# Patient Record
Sex: Male | Born: 1935 | Race: White | Hispanic: No | Marital: Married | Smoking: Never smoker
Health system: Southern US, Community
[De-identification: ages and names within clinical notes are randomized; demographics above are authoritative.]

## PROBLEM LIST (undated history)

## (undated) DIAGNOSIS — K6389 Other specified diseases of intestine: Secondary | ICD-10-CM

## (undated) DIAGNOSIS — I440 Atrioventricular block, first degree: Secondary | ICD-10-CM

## (undated) DIAGNOSIS — G5600 Carpal tunnel syndrome, unspecified upper limb: Secondary | ICD-10-CM

## (undated) DIAGNOSIS — G4733 Obstructive sleep apnea (adult) (pediatric): Secondary | ICD-10-CM

## (undated) DIAGNOSIS — Z87442 Personal history of urinary calculi: Secondary | ICD-10-CM

## (undated) DIAGNOSIS — I2510 Atherosclerotic heart disease of native coronary artery without angina pectoris: Secondary | ICD-10-CM

## (undated) DIAGNOSIS — I359 Nonrheumatic aortic valve disorder, unspecified: Secondary | ICD-10-CM

## (undated) DIAGNOSIS — N200 Calculus of kidney: Secondary | ICD-10-CM

## (undated) DIAGNOSIS — D126 Benign neoplasm of colon, unspecified: Secondary | ICD-10-CM

## (undated) DIAGNOSIS — I4510 Unspecified right bundle-branch block: Secondary | ICD-10-CM

## (undated) DIAGNOSIS — G4700 Insomnia, unspecified: Secondary | ICD-10-CM

## (undated) DIAGNOSIS — G629 Polyneuropathy, unspecified: Secondary | ICD-10-CM

## (undated) DIAGNOSIS — I10 Essential (primary) hypertension: Secondary | ICD-10-CM

## (undated) DIAGNOSIS — K219 Gastro-esophageal reflux disease without esophagitis: Secondary | ICD-10-CM

## (undated) DIAGNOSIS — E785 Hyperlipidemia, unspecified: Secondary | ICD-10-CM

## (undated) DIAGNOSIS — M1990 Unspecified osteoarthritis, unspecified site: Secondary | ICD-10-CM

## (undated) DIAGNOSIS — R42 Dizziness and giddiness: Secondary | ICD-10-CM

## (undated) DIAGNOSIS — J189 Pneumonia, unspecified organism: Secondary | ICD-10-CM

## (undated) DIAGNOSIS — K638219 Small intestinal bacterial overgrowth, unspecified: Secondary | ICD-10-CM

## (undated) DIAGNOSIS — I255 Ischemic cardiomyopathy: Secondary | ICD-10-CM

## (undated) HISTORY — DX: Dizziness and giddiness: R42

## (undated) HISTORY — DX: Nonrheumatic aortic valve disorder, unspecified: I35.9

## (undated) HISTORY — DX: Insomnia, unspecified: G47.00

## (undated) HISTORY — PX: OTHER SURGICAL HISTORY: SHX169

## (undated) HISTORY — DX: Hyperlipidemia, unspecified: E78.5

## (undated) HISTORY — DX: Small intestinal bacterial overgrowth, unspecified: K63.8219

## (undated) HISTORY — DX: Obstructive sleep apnea (adult) (pediatric): G47.33

## (undated) HISTORY — DX: Other specified diseases of intestine: K63.89

## (undated) HISTORY — PX: COLONOSCOPY W/ POLYPECTOMY: SHX1380

## (undated) HISTORY — DX: Essential (primary) hypertension: I10

## (undated) HISTORY — PX: KNEE SURGERY: SHX244

## (undated) HISTORY — DX: Polyneuropathy, unspecified: G62.9

## (undated) HISTORY — PX: ESOPHAGOGASTRODUODENOSCOPY: SHX1529

## (undated) HISTORY — DX: Benign neoplasm of colon, unspecified: D12.6

## (undated) HISTORY — DX: Carpal tunnel syndrome, unspecified upper limb: G56.00

---
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
## 1988-08-01 HISTORY — PX: SHOULDER OPEN ROTATOR CUFF REPAIR: SHX2407

## 1992-08-01 HISTORY — PX: HERNIA REPAIR: SHX51

## 2000-10-01 ENCOUNTER — Inpatient Hospital Stay (HOSPITAL_COMMUNITY): Admission: EM | Admit: 2000-10-01 | Discharge: 2000-10-03 | Payer: Self-pay

## 2001-08-01 HISTORY — PX: CORONARY ANGIOPLASTY: SHX604

## 2001-08-01 HISTORY — PX: CORONARY STENT PLACEMENT: SHX1402

## 2001-08-01 HISTORY — PX: CARDIAC CATHETERIZATION: SHX172

## 2002-01-04 ENCOUNTER — Encounter: Payer: Self-pay | Admitting: Emergency Medicine

## 2002-01-04 ENCOUNTER — Inpatient Hospital Stay (HOSPITAL_COMMUNITY): Admission: EM | Admit: 2002-01-04 | Discharge: 2002-01-07 | Payer: Self-pay | Admitting: Emergency Medicine

## 2002-01-28 ENCOUNTER — Ambulatory Visit (HOSPITAL_COMMUNITY): Admission: RE | Admit: 2002-01-28 | Discharge: 2002-01-29 | Payer: Self-pay | Admitting: Cardiovascular Disease

## 2002-08-01 HISTORY — PX: CATARACT EXTRACTION, BILATERAL: SHX1313

## 2005-10-30 HISTORY — PX: LITHOTRIPSY: SUR834

## 2005-10-30 HISTORY — PX: KIDNEY STONE SURGERY: SHX686

## 2005-11-18 ENCOUNTER — Emergency Department (HOSPITAL_COMMUNITY): Admission: EM | Admit: 2005-11-18 | Discharge: 2005-11-18 | Payer: Self-pay | Admitting: Emergency Medicine

## 2005-11-28 ENCOUNTER — Ambulatory Visit (HOSPITAL_COMMUNITY): Admission: RE | Admit: 2005-11-28 | Discharge: 2005-11-28 | Payer: Self-pay | Admitting: Urology

## 2007-08-02 HISTORY — PX: CHOLECYSTECTOMY: SHX55

## 2007-10-28 ENCOUNTER — Inpatient Hospital Stay (HOSPITAL_COMMUNITY): Admission: EM | Admit: 2007-10-28 | Discharge: 2007-11-02 | Payer: Self-pay | Admitting: Emergency Medicine

## 2007-10-31 ENCOUNTER — Encounter (INDEPENDENT_AMBULATORY_CARE_PROVIDER_SITE_OTHER): Payer: Self-pay | Admitting: *Deleted

## 2009-02-12 IMAGING — RF DG CHOLANGIOGRAM OPERATIVE
1 series · 17 of 24 positions shown · non-contrast
Comparison: None

CLINICAL DATA: Cholelithiasis and cholecystitis

INTRAOPERATIVE CHOLANGIOGRAM
TECHNIQUE: Multiple fluoroscopic spot radiographs were obtained
during intraoperative cholangiogram and are submitted for
interpretation post-operatively.

[Series 0: run · 17 of 56 slices shown]
[im 1/56]
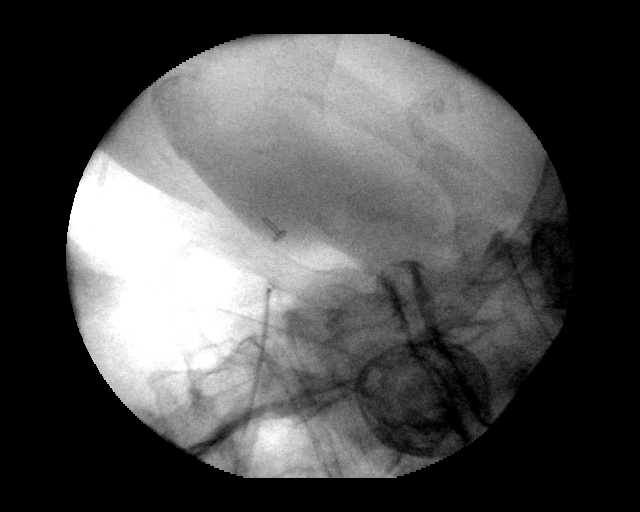
[im 5/56]
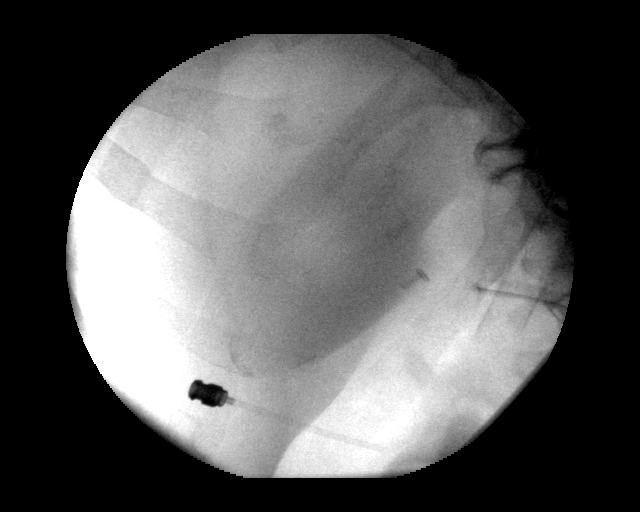
[im 8/56]
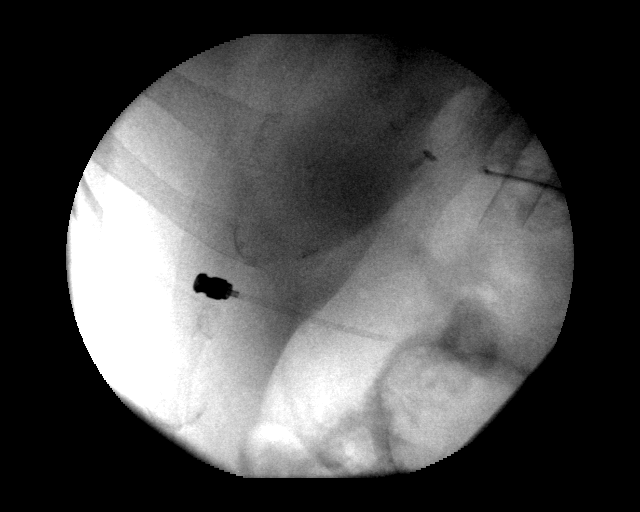
[im 10/56]
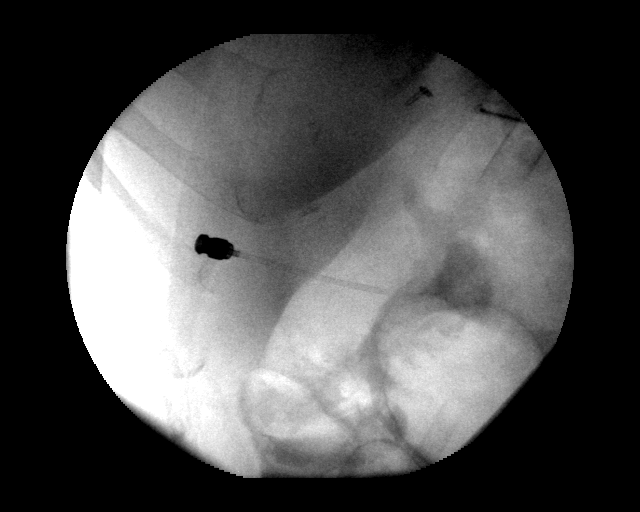
[im 15/56]
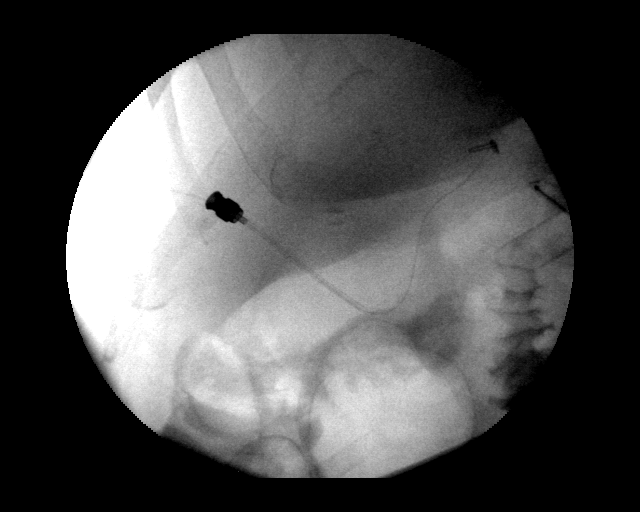
[im 17/56]
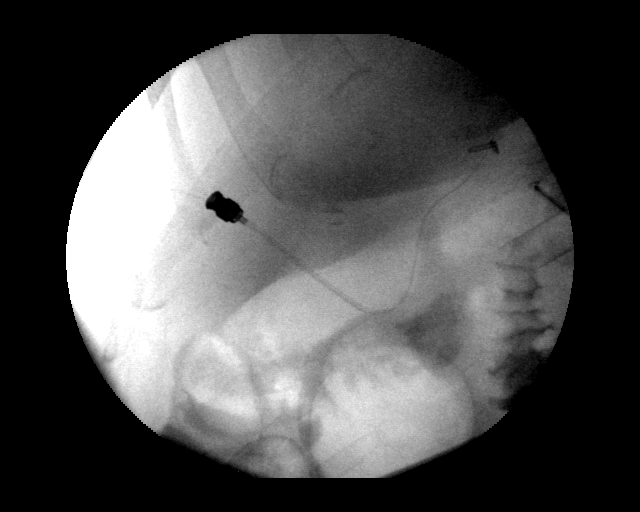
[im 22/56]
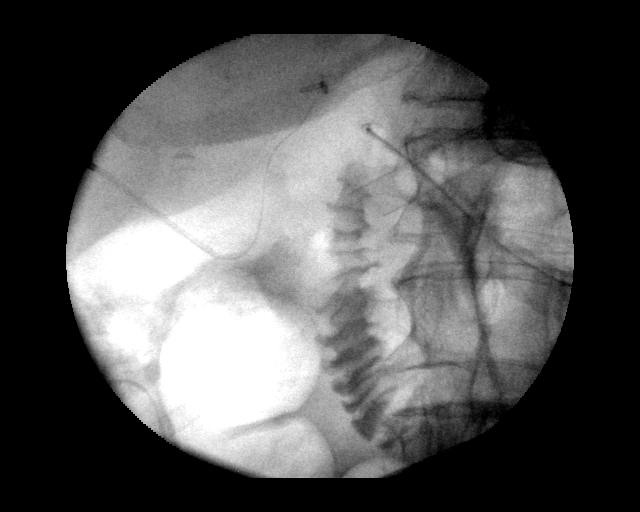
[im 24/56]
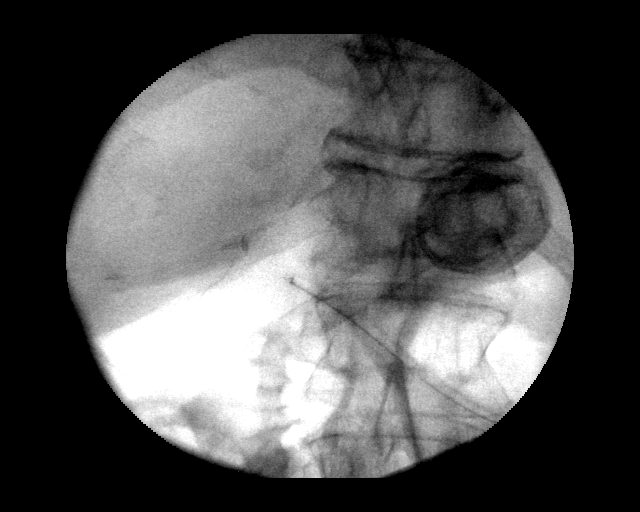
[im 29/56]
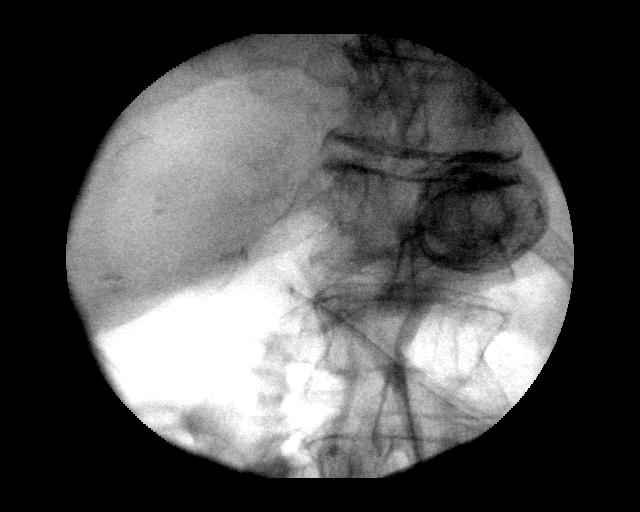
[im 32/56]
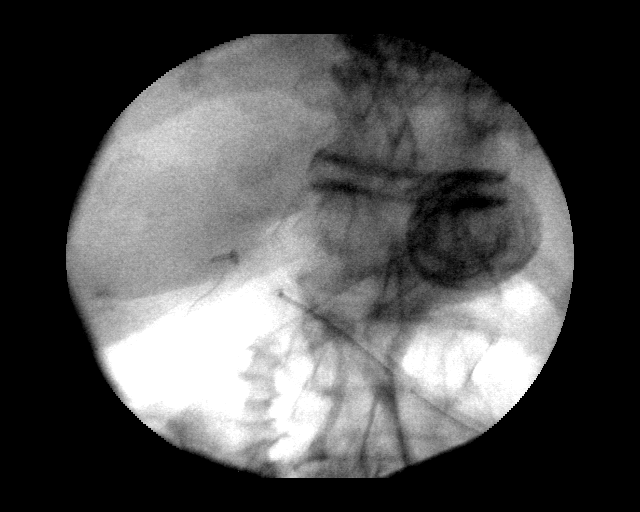
[im 34/56]
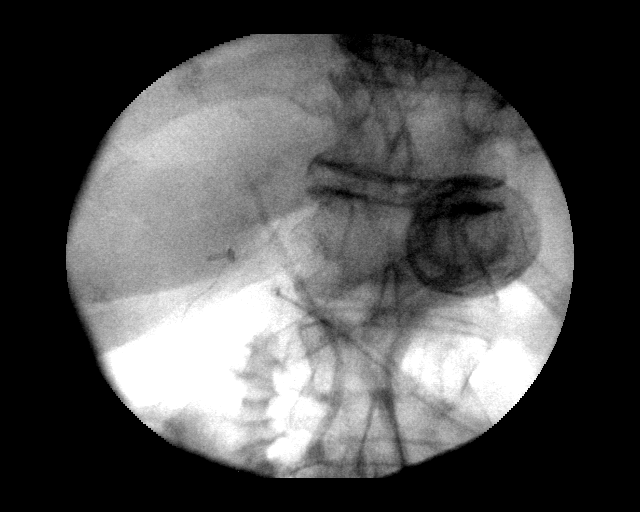
[im 39/56]
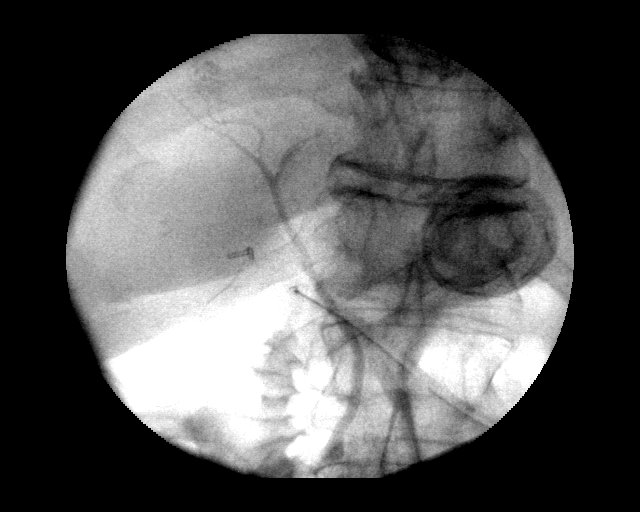
[im 41/56]
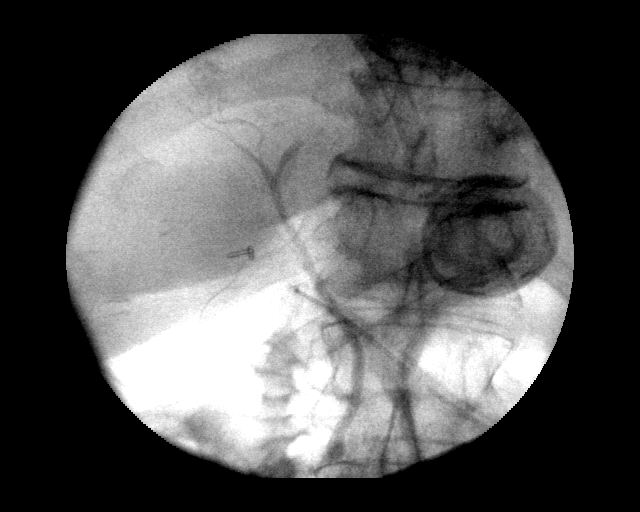
[im 46/56]
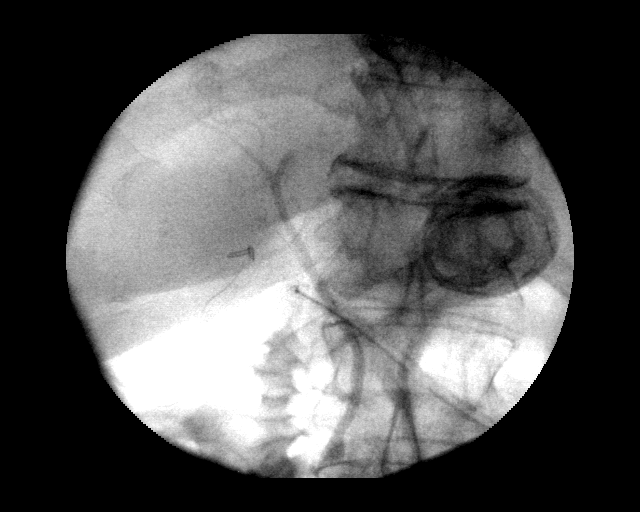
[im 48/56]
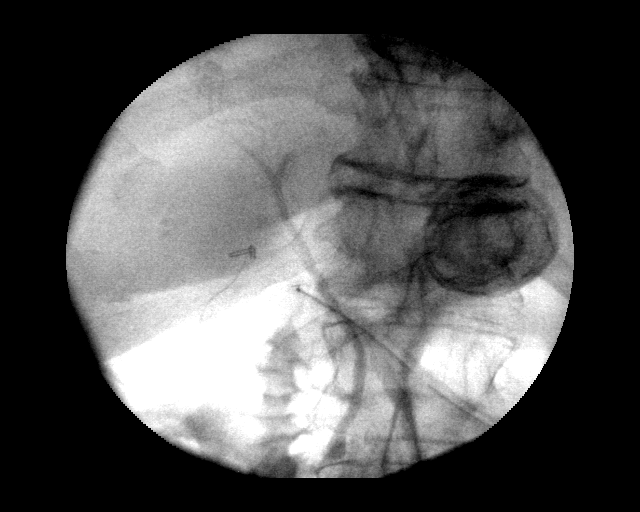
[im 51/56]
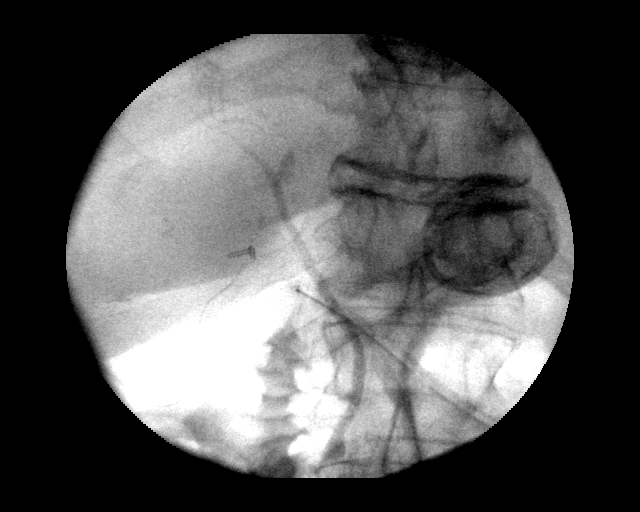
[im 56/56]
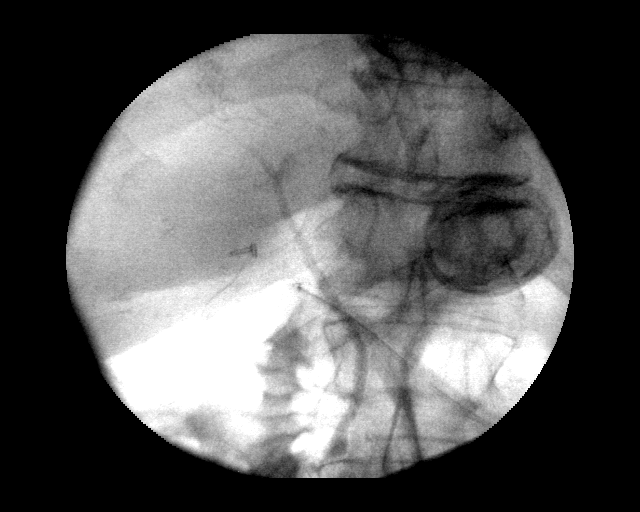

[17 of 24 positions shown; findings below may reference images not displayed]

FINDINGS: C-arm images demonstrate that the common hepatic and
common bile duct are widely patent.  There is free flow of contrast
into the duodenum with no visible filling defects.
IMPRESSION: Normal intraoperative cholangiogram.

## 2010-06-17 ENCOUNTER — Ambulatory Visit: Payer: Self-pay | Admitting: Cardiology

## 2010-06-17 LAB — LIPID PANEL
LDL Cholesterol: 80 mg/dL
Triglycerides: 195 mg/dL — AB (ref 40–160)

## 2010-08-30 ENCOUNTER — Encounter: Payer: Self-pay | Admitting: Cardiology

## 2010-08-30 DIAGNOSIS — I259 Chronic ischemic heart disease, unspecified: Secondary | ICD-10-CM | POA: Insufficient documentation

## 2010-08-30 DIAGNOSIS — I241 Dressler's syndrome: Secondary | ICD-10-CM | POA: Insufficient documentation

## 2010-08-30 DIAGNOSIS — I119 Hypertensive heart disease without heart failure: Secondary | ICD-10-CM | POA: Insufficient documentation

## 2010-10-19 ENCOUNTER — Ambulatory Visit: Payer: Self-pay | Admitting: Cardiology

## 2010-10-21 ENCOUNTER — Ambulatory Visit (INDEPENDENT_AMBULATORY_CARE_PROVIDER_SITE_OTHER): Payer: Medicare Other | Admitting: *Deleted

## 2010-10-21 ENCOUNTER — Other Ambulatory Visit: Payer: Self-pay | Admitting: Cardiology

## 2010-10-21 DIAGNOSIS — Z79899 Other long term (current) drug therapy: Secondary | ICD-10-CM

## 2010-10-21 DIAGNOSIS — E7800 Pure hypercholesterolemia, unspecified: Secondary | ICD-10-CM

## 2010-10-22 ENCOUNTER — Encounter: Payer: Self-pay | Admitting: Cardiology

## 2010-10-22 ENCOUNTER — Ambulatory Visit (INDEPENDENT_AMBULATORY_CARE_PROVIDER_SITE_OTHER): Payer: Medicare Other | Admitting: Cardiology

## 2010-10-22 DIAGNOSIS — I10 Essential (primary) hypertension: Secondary | ICD-10-CM

## 2010-10-22 DIAGNOSIS — E7800 Pure hypercholesterolemia, unspecified: Secondary | ICD-10-CM

## 2010-10-22 DIAGNOSIS — I2510 Atherosclerotic heart disease of native coronary artery without angina pectoris: Secondary | ICD-10-CM

## 2010-10-22 LAB — LIPID PANEL
Cholesterol: 157 mg/dL (ref 0–200)
VLDL: 39 mg/dL (ref 0–40)

## 2010-10-22 LAB — COMPREHENSIVE METABOLIC PANEL
ALT: 13 U/L (ref 0–53)
CO2: 24 mEq/L (ref 19–32)
Chloride: 101 mEq/L (ref 96–112)
Potassium: 4.1 mEq/L (ref 3.5–5.3)
Sodium: 138 mEq/L (ref 135–145)
Total Bilirubin: 0.6 mg/dL (ref 0.3–1.2)
Total Protein: 7 g/dL (ref 6.0–8.3)

## 2010-10-22 NOTE — Progress Notes (Signed)
History of Present Illness: This pleasant 76 year old gentleman is seen for a four-month followup office visit.  He has a history of ischemic heart disease.  He had a heart attack with cardiac catheterization and atherectomy and stent to his LAD in 2003.  This was a staged procedure after he had had successful percutaneous transluminal coronary angioplasty and stenting of his left circumflex at the time of his acute inferolateral myocardial infarction.  Since then he has done well.  His last nuclear stress test on 11/03/09 showed an ejection fraction of 53% and no reversible ischemia.  The patient has felt well.  He has not had any recurrent angina.  He stays physically active doing yard work.  He has not been using his treadmill much.  His weight is unchanged.  We had [facility] into [facility] for the patient last visit but he no longer qualifies because he's not had any acute events or procedures.  Current Outpatient Prescriptions  Medication Sig Dispense Refill  . amLODipine (NORVASC) 5 MG tablet Take 5 mg by mouth daily.        Marland Kitchen aspirin 325 MG EC tablet Take 325 mg by mouth daily.        . clopidogrel (PLAVIX) 75 MG tablet Take 75 mg by mouth daily.        Marland Kitchen gabapentin (NEURONTIN) 800 MG tablet Take 1,200 mg by mouth daily.       Marland Kitchen lisinopril (PRINIVIL,ZESTRIL) 20 MG tablet Take 20 mg by mouth daily.        . metoprolol (TOPROL-XL) 25 MG 24 hr tablet Take 25 mg by mouth daily.        . Multiple Vitamin (MULTIVITAMIN) tablet Take 1 tablet by mouth daily.        . niacin-simvastatin (SIMCOR) 1000-20 MG 24 hr tablet Take 1 tablet by mouth at bedtime.        . nitroGLYCERIN (NITROSTAT) 0.4 MG SL tablet Place 0.4 mg under the tongue every 5 (five) minutes as needed.        . sildenafil (VIAGRA) 100 MG tablet Take 100 mg by mouth as needed.        . zolpidem (AMBIEN) 10 MG tablet Take 10 mg by mouth at bedtime as needed.          Allergies  Allergen Reactions  . Lipitor  (Atorvastatin Calcium)   . Mevacor (Lovastatin)     Patient Active Problem List  Diagnoses  . Ischemic heart disease  . Post myocardial infarction syndrome  . Coronary artery disease  . Aortic valve disease  . Aortic valve sclerosis  . Aortic insufficiency  . Hypertension    History  Smoking status  . Former Smoker  Smokeless tobacco  . Not on file    History  Alcohol Use: Not on file    No family history on file.  Review of Systems: Constitutional: no fever chills diaphoresis or fatigue or change in weight.  Head and neck: no hearing loss, no epistaxis, no photophobia or visual disturbance. Respiratory: No cough, shortness of breath or wheezing. Cardiovascular: No chest pain peripheral edema, palpitations. Gastrointestinal: No abdominal distention, no abdominal pain, no change in bowel habits hematochezia or melena. Genitourinary: No dysuria, no frequency, no urgency, no nocturia. Musculoskeletal:No arthralgias, no back pain, no gait disturbance or myalgias. Neurological: No dizziness, no headaches, no numbness, no seizures, no syncope, no weakness, no tremors. Hematologic: No lymphadenopathy, no easy bruising. Psychiatric: No confusion, no hallucinations, no sleep disturbance.  Physical Exam: His weight is 203.  Vital signs as recorded.  The general appearance is that of a well-developed well-nourished gentleman in no distress.Pupils equal and reactive.   Extraocular Movements are full.  There is no scleral icterus.  The mouth and pharynx are normal.  The neck is supple.  The carotids reveal no bruits.  The jugular venous pressure is normal.  The thyroid is not enlarged.  There is no lymphadenopathy.The chest is clear to percussion and auscultation. There are no rales or rhonchi. Expansion of the chest is symmetrical.The precordium is quiet.  The first heart sound is normal.  The second heart sound is physiologically split.  There is no gallop rub or click.There is a  grade 2/6 systolic ejection murmur at the aortic area.  There is no abnormal lift or heave.The abdomen is soft and nontender. Bowel sounds are normal. The liver and spleen are not enlarged. There Are no abdominal masses. There are no bruits.The pedal pulses are good.  There is no phlebitis or edema.  There is no cyanosis or clubbing.Strength is normal and symmetrical in all extremities.  There is no lateralizing weakness.  There are no sensory deficits.   Assessment / Plan:

## 2010-10-22 NOTE — Assessment & Plan Note (Signed)
Blood pressure remains satisfactory on present medications.

## 2010-10-22 NOTE — Assessment & Plan Note (Signed)
The patient will continue his same medication but work harder on weight loss and limitation of carbohydrates.

## 2010-10-22 NOTE — Assessment & Plan Note (Signed)
No recurrent angina pectoris.  He will be due for another stress test later this year.

## 2010-11-15 ENCOUNTER — Other Ambulatory Visit: Payer: Self-pay | Admitting: Cardiology

## 2010-11-15 DIAGNOSIS — I10 Essential (primary) hypertension: Secondary | ICD-10-CM

## 2010-11-15 NOTE — Telephone Encounter (Signed)
Refill request

## 2010-12-02 ENCOUNTER — Other Ambulatory Visit: Payer: Self-pay | Admitting: Dermatology

## 2010-12-05 ENCOUNTER — Other Ambulatory Visit: Payer: Self-pay | Admitting: Cardiology

## 2010-12-05 DIAGNOSIS — I10 Essential (primary) hypertension: Secondary | ICD-10-CM

## 2010-12-14 NOTE — Discharge Summary (Signed)
NAME:  Gabriel [name], Gabriel [name] NO.:  [id number]   MEDICAL RECORD NO.:  [id number]          PATIENT TYPE:  INP   LOCATION:  5503                         FACILITY:  [facility]   PHYSICIAN:  Alfonse Ras, MD   DATE OF BIRTH:  June 08, 1935   DATE OF ADMISSION:  10/27/2007  DATE OF DISCHARGE:  11/02/2007                               DISCHARGE SUMMARY   OPERATING SURGEON:  Alfonse Ras, MD.   CONSULTANT:  Cassell Clement, M.D. with cardiology.   REASON FOR ADMISSION:  This is a 76 year old white male who presented to  the emergency department with right upper quadrant tenderness.  There is  no H and P in the chart, and therefore I do not know any of the details  of the patient's presenting symptoms except for right upper quadrant  tenderness.  On presentation to the emergency department, the patient's  white blood cell count was found to be 17,400 with 85% neutrophils.  His  total bilirubin was found to be 1.4 with no other abnormalities in his  LFTs.  A CT scan at this time was done of the abdomen and pelvis, which  showed a distended gallbladder with questionable gallbladder wall  thickening and a followup ultrasound was recommended.  At this time, an  ultrasound of his abdomen was done, which showed cholelithiasis with  gallbladder wall thickening and sonographic Murphy sign compatible with  acute cholecystitis.  At this time, the patient was admitted and was  kept n.p.o. except for ice chips and medications.  He was started on IV  Unasyn.  He also had his Plavix as well as aspirin held for potential  surgical intervention in the future.   ADMITTING DIAGNOSIS:  Acute cholecystitis.   HOSPITAL COURSE:  On hospital day 1, the patient was admitted and labs  will be checked, which showed a decrease in his white blood cell count  fell to 13,400.  Otherwise, his total bilirubin had decreased back down  to 1.1 and the rest of his LFTs at that time were normal.  At this  time,  the patient was continued on hold for his aspirin and Plavix, and Dr.  Patty Sermons was consulted to clear the patient for central surgical  intervention.  Dr. Patty Sermons saw the patient on hospital day 2, which  was on October 29, 2007.  At this time, he recommended getting a baseline  EKG as well as a chest x-ray.  His findings on EKG were essentially  normal with sinus rhythm with a first-degree AV block.  His chest x-ray  was also essentially normal except for showing low lung volumes with  bibasilar right greater than left atelectasis.  At this time, Cardiology  felt that the patient was cleared for surgery and allowed [place] to be okay  to proceed.  On exam on hospital day 2, the patient was still having  persistent right upper quadrant pain, however, the patient stated that  he was beginning to get hungry and therefore he was started on a clear  liquid diet.  At this time, plans of proceeding with a laparoscopic  cholecystectomy on either October 30, 2007, or October 31, 2007.  By October 30, 2007, the patient was still complaining of abdominal pain, however,  this was well controlled with pain medicines.  At this point, he was  tolerating clears with minimal nausea.  At this point, we are planning  on proceeding with a laparoscopic cholecystectomy on October 31, 2007.  This allowed [place] to give the patient an extra day off of his aspirin and  Plavix to help prevent intraoperative bleeding.  On October 31, 2007, the  patient was taken to the operating room where a laparoscopic  cholecystectomy was performed with intraoperative cholangiogram which  was normal.  In the surgery, a gangrenous gallbladder was found and was  removed with no complications.  On postoperative day 1, the patient had  a mild temperature of 100, but stated that his pain was somewhat better  and fairly well controlled on his Vicodin.  Of note, because of some  drainage during surgery a JP drain was placed.  On postoperative day  1,  this drain had some serous drainage.  Otherwise, the rest of his  incisions had just minimal drainage noted.  At this time, based on the  patient having a drain as well as still some discomfort, he was kept an  extra day.  On postoperative day 2, the patient was now afebrile and was  feeling very well.  He had been tolerating a regular low-fat diet and  ambulating in the halls.  At this time, he states that he is ready to go  home.  On physical exam, the patient's abdomen is soft and tender with  active bowel sounds.  His JP drain shows minimal serosanguineous output  and the drain at this time was removed with no complications.  After the  drain was removed a pressure gauze dressing was placed over the drain  site.  At this time, the patient was felt stable to be discharged home.   DISCHARGE DIAGNOSES:  1. Acute gangrenous cholecystitis.  2. Status post laparoscopic cholecystectomy with intraoperative      cholangiogram.   DISCHARGE MEDICATIONS:  The patient is told to continue all of his home  medications except for his Plavix and aspirin.  He is told that he may  begin these on Saturday.  However, he was told that when at home if he  began to have some drainage from his incision that was bloody, he may  want to hold his Plavix and aspirin to either Sunday or Monday.  Otherwise, the patient was given a prescription for Vicodin 5/325 1-2  tablets every 4 hours as needed for pain.   DISCHARGE INSTRUCTIONS:  The patient was informed that he may return to  work early next week as he felt able.  The patient does a desk job and  therefore this does not require any lifting or heavy activity.  He is  told that he has no dietary restrictions, that he may increase his  activity slowly, walk up steps.  He is also informed that he may shower,  however, he is not safe for the next 2 weeks.  He is also informed not  to lift anything for 2 weeks greater than 15 pounds or to drive while   taking his narcotic pain medication.  He is also informed to pat his  Steri-Strips dry when he gets out of the shower and to not rub them.  His wife is also informed that she may change  the gauze dressing over  his drain site as needed due to potential saturations.  He is also  informed that he may call our office if he begins to get a fever greater  than 101.5, new or severe abdominal pain or redness or pus-like drainage  from his incisions.  He is to, at this time, follow up with Dr. Colin Benton in  the office in 2 weeks for followup.      Letha Cape, PA      Alfonse Ras, MD  Electronically Signed    KEO/MEDQ  D:  11/02/2007  T:  11/03/2007  Job:  [id number]   cc:   Cassell Clement, M.D.

## 2010-12-14 NOTE — Op Note (Signed)
NAME:  TION, TSE NO.:  [id number]   MEDICAL RECORD NO.:  [id number]          PATIENT TYPE:  INP   LOCATION:  5503                         FACILITY:  [facility]   PHYSICIAN:  Alfonse Ras, MD   DATE OF BIRTH:  [birth year]   DATE OF PROCEDURE:  10/31/2007  DATE OF DISCHARGE:                               OPERATIVE REPORT   PREOPERATIVE DIAGNOSIS:  Acute cholecystitis.   POSTOPERATIVE DIAGNOSES:  Acute cholecystitis, acute gangrenous  cholecystitis.   PROCEDURE:  Laparoscopic cholecystectomy with intraoperative  cholangiogram.   FINDINGS:  Normal cholangiogram and gangrenous gallbladder.   SURGEON:  Alfonse Ras, MD   ASSISTANT:  Leonie Man, M.D.   ANESTHESIA:  General endotracheal tube.   DESCRIPTION OF PROCEDURE:  The patient was taken to the operating room,  placed in supine position.  After adequate general anesthesia was  induced using endotracheal tube, the abdomen was prepped and draped in  normal sterile fashion.  Using a transverse infraumbilical incision, I  dissected down to the fascia.  Fascia was opened vertically.  A 0 Vicryl  pursestring suture was placed around the fascial defect and Hasson  trocar was placed in the abdomen.  Pneumoperitoneum was obtained.  Under  direct vision, an 11-mm port was placed in subxiphoid region, two 5-mm  ports were placed in the right abdomen.  The gallbladder was identified  and a number of very dense omental adhesions were broken up with a  phlegmon around the gallbladder.  It was aspirated with __________  aspirator.  There were areas of patchy necrosis throughout the  gallbladder.  It was grasped and retracted cephalad.  Again, a tedious  blunt dissection was undertaken at the neck of the gallbladder until the  cystic duct was identified.  It appeared quite short and was dissected  free.  It was clipped proximally on the gallbladder and small ductotomy  was made.  Cholangiocatheter was advanced  down through the Angiocath and  the cholangiogram was performed.  This showed free flow into the  duodenum, no filling defects, and normal caliber ducts.  Cholangiocatheter was removed.  Cystic duct was triply clipped and  ligated and divided.  The cystic artery was dissected, triply clipped,  and divided in the similar fashion.  The gallbladder was then taken off  the gallbladder bed using Bovie electrocautery and removed through the  umbilical port.  It was placed in EndoCatch bag prior to removal.  The  right upper quadrant was copiously irrigated.  Surgicel packing was  placed in the gallbladder fossa.  Drain was placed through the trocar in  the right midabdomen and placed in the gallbladder fossa.  Pneumoperitoneum was released.  Trocars  were removed.  Fascial defect was closed with the 0 Vicryl pursestring  suture.  Skin incisions were closed with subcuticular 4-0 Monocryl and  drain was sutured in place.  Sterile dressings were applied.  The  patient tolerated the procedure well and went to PACU in good condition.      Alfonse Ras, MD  Electronically Signed     KRE/MEDQ  D:  10/31/2007  T:  11/01/2007  Job:  [id number]

## 2010-12-17 NOTE — Cardiovascular Report (Signed)
[place]. [facility]  Patient:    Gabriel [name], Gabriel [name] Visit Number: [id number] MRN: [id number]          Service Type: MED Location: 2000 2041 [id number] Attending Physician:  Koren Bound Dictated by:   Armanda Magic, M.D. Proc. Date: 01/04/02 Admit Date:  01/04/2002   CC:         Thomas A. Patty Sermons, M.D.   Cardiac Catheterization  DATE OF BIRTH:  1934/10/03  PRIMARY CARDIOLOGIST:  Maisie Fus A. Patty Sermons, M.D.  PROCEDURE:  Left heart catheterization, coronary angiography, left ventriculography.  OPERATOR:  Armanda Magic, M.D.  INDICATIONS:  Acute inferior posterior MI.  COMPLICATIONS:  None.  IV ACCESS:  Via right femoral artery, #6 French sheath.  HISTORY:  This is a 76 year old white male with a history of PTCA of the right coronary artery and PTCA/stenting of the right coronary artery, most recently a year ago.  He now presents with an acute inferior posterior MI.  DESCRIPTION OF PROCEDURE:  The patient was brought to the cardiac catheterization laboratory in the fasting nonsedated state.  Informed consent was obtained.  The patient was connected to continuous heart rate and pulse oximetry monitoring and intermittent blood pressure monitoring.  The right groin was prepped and draped in a sterile fashion.  Xylocaine, 1%, was used for local anesthesia.  Using the modified Seldinger technique, a #6 French sheath was placed in the right femoral artery.  Under fluoroscopic guidance, a #6 [place] JR4 catheter was placed in the right coronary artery.  Multiple cine films were taken in a 30-degree RAO, 40-degree LAO view.  This catheter was then exchanged out over a guidewire for a #6 [place] JL4 catheter which was placed under fluoroscopic guidance in the left coronary artery.  Multiple cine films were taken in a 30-degree RAO, 40-degree LAO view.  This catheter was then exchanged out over a guidewire for a #6 French angled pigtail catheter was  placed in the left ventricular cavity.  Left ventriculography was performed in a 30-degree RAO view using a total of 30 cc of contrast at 13 cc per second and in the 40-degree cranial LAO view using a total of 20 cc at 12 cc per second.  The catheter was then pulled back across the aortic valve with no significant gradient noted.  At the end of the procedure, the patient went on to percutaneous transluminal intervention of the left circumflex artery by Dr. Delane Ginger.  RESULTS: 1. The right coronary artery is patent with luminal irregularities up to 20%.    It distally bifurcates into a posterior descending artery and    posterolateral artery, both of which are widely patent.  2. The left coronary artery bifurcates after a short left main into the left    circumflex and left anterior descending artery.  3. The left circumflex is proximally occluded.  4. The left anterior descending artery gives rise to a first diagonal which is    patent and then there is a 60-70% narrowing in the proximal and mid portion    of the LAD at the takeoff of the second diagonal branch.  The rest of the    LAD is patent to the apex.  LEFT VENTRICULOGRAPHY:  Left ventriculography performed in the 30-degree RAO view and 40-degree LAO view using a total of 30 cc of contrast at 13 cc per second and 20 cc of contrast at 12 cc per second showed moderate LV dysfunction, EF 35%, with severe  posterior inferior hypokinesis.  HEMODYNAMIC DATA: 1. Aortic pressure 130/84 mmHg. 2. Left ventricular pressure 132/31 mmHg.  ASSESSMENT: 1. Acute inferior posterior myocardial infarction. 2. Occluded left circumflex.  Patent right coronary artery stent and 60-70%    left anterior descending artery at the takeoff of the second diagonal. 3. Moderate left ventricular dysfunction with inferior posterior hypokinesis.  PLAN:  Admit to the CCU and percutaneous transluminal intervention of the left circumflex by Dr.  Elease Hashimoto. Dictated by:   Armanda Magic, M.D. Attending Physician:  Koren Bound DD:  01/04/02 TD:  01/07/02 Job: [id number]

## 2010-12-17 NOTE — Discharge Summary (Signed)
[place]. [facility]  Patient:    Gabriel [name], Gabriel [name]                      MRN: [id number] Adm. Date:  [id number] Disc. Date: [id number] Attending:  Rudean Hitt                           Discharge Summary  FINAL DIAGNOSES: 1. Acute myocardial infarction. 2. Coronary atherosclerosis. 3. Hyperlipidemia.  OPERATIONS:  Cardiac catheterization by Dr. Meade Maw, with angioplasty and stent by Dr. Delane Ginger on October 01, 2000.  HISTORY OF PRESENT ILLNESS:  This 76 year old gentleman was admitted as an emergency on October 01, 2000, by Dr. Meade Maw because of chest discomfort. He began experiencing chest pain radiating to the left arm approximately 12 hours prior to presentation.  He had had a complete physical in our office two days earlier and was scheduled for a stress Cardiolite the next week because of a recent history of recurrent chest pain suggestive of recurrent angina.  He had had a previous angioplasty in 1996, by Dr. Elease Hashimoto.  The patient had been feeling well until several weeks prior to admission when he began to experience some exertional chest discomfort of a mild nature, resolving with rest.  ADMISSION PHYSICAL EXAMINATION:  Blood pressure 110/56, heart rate 71, temperature 98.7, oxygen saturation 96% on room air.  He had some mild left shoulder pain which was resolving with IV nitroglycerin.  There was no neck vein distention.  The chest was clear.  The heart revealed no gallop.  The abdomen was soft and nontender.  The extremities showed no peripheral edema, and pedal pulses were good.  HOSPITAL COURSE:  The electrocardiographic analysis initially showed normal EKG, with normal R-wave progression, and no definite ischemic changes. However, his CK returned elevated at 505, with an MB of 60, and his troponin I returned elevated at 1.51.  The initial plan was to treat him with IV nitroglycerin and aspirin, Integrilin, and Lovenox,  and allow Dr. Elease Hashimoto to perform cardiac catheterization the following day.  However, the patient continued to have chest discomfort and, therefore, Dr. Fraser Din took him to the laboratory semi-acutely, where cardiac catheterization was performed, and then Dr. Elease Hashimoto was asked to proceed with the angioplasty.  He was found to have an occluded mid distal right coronary artery.  He was found to have good antegrade flow but with obvious dissection of the disease right coronary artery.  He was continued on Integrilin for 18 hours postangioplasty and stent.  By October 02, 2000, he was able to ambulate later in the day, and he was begun on Plavix.  By the following morning, October 03, 2000, he was stable enough to be discharged home improved.  CK-MBs were 60, then 84, then 72.  EKG remained stable.  DISCHARGE MEDICATIONS: 1. Lipitor 10 mg 1 daily. 2. Lotrel 5/10 1 daily. 3. Ecotrin 81 mg daily. 4. Plavix 75 mg daily for one month. 5. Nitrostat 1/150 sublingually p.r.n. for chest pain.  DISCHARGE INSTRUCTIONS:  The patient is to walk at home.  He is to rest at home this week and not return to work.  FOLLOW-UP:  He will be seen in our office in one week for follow-up office visit and EKG.  DIET:  He will remain on a low cholesterol diet. DD:  10/14/00 TD:  10/15/00 Job: [id number]

## 2010-12-17 NOTE — Cardiovascular Report (Signed)
[place]. [facility]  Patient:    Gabriel [name], Gabriel [name]                      MRN: [id number] Proc. Date: 10/01/00 Adm. Date:  [id number] Attending:  Rudean Hitt CC:         Thomas A. Patty Sermons, M.D.   Cardiac Catheterization  PROCEDURES PERFORMED:  Percutaneous transluminal coronary angioplasty.  INDICATIONS:  Mr. Durenda Age is a 76 year old male with a history of coronary artery disease.  He started having episodes of chest pain and chest burning this morning.  He had some relief with Mylanta and nitroglycerin but the pain persisted.  He presented to the emergency room and was brought to the catheterization lab because of this persistent pain.  Please see the dictated cardiac catheterization note by Dr. Meade Maw.  PERCUTANEOUS TRANSLUMINAL CORONARY ANGIOPLASTY PROCEDURE:  The patient was found to have an occluded mid/distal right coronary artery, was also found to have some moderate disease involving the mid LAD.  It was elected to proceed with PTCA and intervention of the right coronary artery.  The patient had been given heparin in the emergency room.  His ACT was 216.  An additional 1500 units of heparin was given.  He was continued on the Integrilin drip.  The right coronary artery was engaged using a Judkins right 4 7 [place] guide.  The right coronary artery was easily wired using a Trooper angioplasty wire.  A 2.5 x 15 mm Quantum Monorail balloon was passed down to the distal stenosis. It was inflated up to 6 atmospheres for 44 seconds.  This resulted in some improvement of that lumen but some distal embolization of the clot.  The balloon was then passed down to the distal most segment of the occlusion and was inflated on three separate occasions up to 4 atmospheres for 30 seconds each.  This resulted in clearing of the thrombus burden with good flow. Followup angiography at this time revealed a plaque disruption which was probably the cause  of his acute myocardial infarction.  There was TIMI grade flow.  The length of the dissection and associated thrombus appear to be approximately 15 to 17 mm long.  A 2.5 x 25 mm Bx Velocity Hepacoat stent was positioned to cover the entire segment with a few millimeters of overlap on each side.  It was deployed at 12 atmospheres for 46 seconds.  Followup angiography revealed a very nicely opposed stented segment.  The mid area of the stent appeared to be somewhat under deployed.  At this point, the 2.75 x 15 mm Quantum Monorail was placed back down into the stent.  It was inflated up to 18 atmospheres for a total of 44 seconds.  The ending size was 2.9 mm.  Followup angiography revealed a very nice lumen with no obstruction to flow.  There was TIMI grade 3 flow at the end of the case.  There was some pseudostenosis created with wire placement.  This pseudostenosis resolved completely after the wire was removed.  COMPLICATIONS:  None.  CONCLUSIONS: 1. Successful percutaneous transluminal coronary angioplasty and stenting of    the mid/distal right coronary artery. 2. Well preserved left ventricular systolic function. 3. Moderate disease involving the left anterior descending.  We will consider    any further interventions after he heals up from this myocardial    infarction. DD:  10/01/00 TD:  10/02/00 Job: [id number]

## 2010-12-17 NOTE — Discharge Summary (Signed)
. [facility]  Patient:    Gabriel [name], Gabriel [name] Visit Number: [id number] MRN: [id number]          Service Type: CAT Location: 6500 6523 01 Attending Physician:  Koren Bound Dictated by:   Alvia Grove., M.D. Admit Date:  01/28/2002 Discharge Date: 01/29/2002   CC:         Thomas A. Patty Sermons, M.D.   Discharge Summary  DISCHARGE DIAGNOSES: 1. Coronary artery disease, status post rotational atherectomy and stenting of his left anterior descending coronary artery. 2. Hypertension.  DISCHARGE MEDICATIONS: 1. Enteric coated aspirin 325 mg q.d. 2. Plavix 75 mg q.d. 3. Norvasc 5 mg q.d. 4. Toprol XL 50 mg p.o. q.d. 5. Nitroglycerin p.r.n. 6. Altace 10 mg p.o. q.d.  FOLLOWUP:  The patient is to see Dr. Patty Sermons in several weeks.  DISCHARGE INSTRUCTIONS:  He is to eat a low fat, low salt diet. He is to call Dr. Elease Hashimoto for any additional problems.  HISTORY OF PRESENT ILLNESS:  The patient is a 76 year old gentleman with a history of coronary artery disease. He is admitted for PTCA of his left anterior descending coronary artery. Please see the dictated history and physical for further details.  HOSPITAL COURSE BY PROBLEM:  #1 - CORONARY ARTERY DISEASE:  The patient was admitted for scheduled rotational atherectomy and stenting of his LAD. He underwent successful rotational atherectomy using a 2.15 mm bur. We then placed a 3.0 x 16 mm express 2 stent inflated up to 16 atmospheres. He had a nice result. There was TIMI grade 3 flow. The patient did quite well following the procedure and was discharged in satisfactory condition. His left circumflex stent that we placed several weeks ago is widely patent. He will be followed up by Dr. Patty Sermons. Dictated by:   Alvia Grove., M.D. Attending Physician:  Koren Bound DD:  01/29/02 TD:  01/30/02 Job: [id number]

## 2010-12-17 NOTE — Discharge Summary (Signed)
[place]. [facility]  Patient:    Gabriel [name], Gabriel [name] Visit Number: [id number] MRN: [id number]          Service Type: CAT Location: 6500 6523 01 Attending Physician:  Koren Bound Admit Date:  01/28/2002 Discharge Date: 01/29/2002   CC:         Thomas A. Patty Sermons, M.D.   Discharge Summary  ADMISSION DIAGNOSES: Acute myocardial infarction.  DISCHARGE DIAGNOSES: 1. Acute myocardial infarction (inferior lateral wall). 2. History of coronary artery disease in the past with several PTCA and    stenting procedures in the past. 3. Hypertension. 4. Hyperlipidemia.  DISCHARGE MEDICATIONS: 1. Altace 10 mg daily. 2. Norvasc 5 mg daily. 3. Ecotrin 325 mg daily. 4. Plavix 75 mg daily. 5. Lopressor 25 mg p.o. b.i.d. 6. Lipitor 10 mg daily. 7. Nitroglycerin p.r.n.  DISPOSITION: The patient will return to see Dr. Patty Sermons in approximately one week.  HISTORY OF PRESENT ILLNESS: Please see dictated H&P for further details of history. He basically presented with an inferior lateral myocardial infarction. He was taken to the cath lab for emergent cath and possible angioplasty.  HOSPITAL COURSE: #1 - ACUTE LATERAL MYOCARDIAL INFARCTION: The patient was found to have an occluded circumflex artery. He underwent successful PTCA and stenting of the circumflex artery. He was also noted to have a LAD stenosis at that time. The patient improved steadily. It was decided to do the LAD procedure at a later date. He had no further episodes of chest pain and was up ambulating in the halls without problems.  #2 - HYPERTENSION: The patients medications were adjusted slightly. We increased his ace inhibitor slightly. He will be followed as an outpatient. The patient will be discharge on the above noted medications and disposition and we will schedule an elective intervention of his LAD lesion in the future. Attending Physician:  Koren Bound DD:   02/13/02 TD:  02/16/02 Job: [phone] [id number]

## 2010-12-17 NOTE — H&P (Signed)
[place]. [facility]  Patient:    Gabriel, [name]                      MRN: [id number] Adm. Date:  [id number] Attending:  Rudean Hitt CC:         Thomas A. Patty Sermons, M.D.   History and Physical  HISTORY:  Gabriel [name] is a 76 year old gentleman who presented to the emergency room with complaints of chest pain radiating to the left arm approximately 12 hours prior to his presentation.  The pain actually occurred the night before but resolved.  He took two sublingual nitroglycerin with relief but the pain returned at 11 a.m. and then persisted.  Associated symptoms included nausea and questionable shortness of breath.  The patient states he has had this chest pain for approximately 15 days but previously it had occurred with exertion but resolving with rest.  He had seen Dr. Patty Sermons on September 29, 2000 and a stress Cardiolite had been scheduled for further evaluation.  The patient reports he had a regular GXT performed in October 2000 which was reportedly normal.  The patient has known coronary artery disease.  An angioplasty was performed in June 1996 per Dr. Elease Hashimoto.  Currently there are no old charts available for review.  His angioplasty is reportedly on microfilm.  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  As noted above, he has had chest pain with exertion which would resolve.  REVIEW OF SYSTEMS:  He has sinus drainage which has been treated by Humibid and reflux symptoms for which he was started on Pepcid.  There has been no change in bowel or bladder habits.  No presyncope.  No syncope.  No palpitations.  No tachy arrhythmias.  PAST MEDICAL HISTORY:  Is as noted above.  Significant coronary artery disease.  Dyslipidemia.  CURRENT MEDICATIONS:  Pepcid, dose unknown.  Lipitor, dose is unknown. Aspirin and Humibid.  ALLERGIES:  He has no known drug  allergies.  PAST SURGICAL HISTORY:  Significant for rotator cuff repair, hernia repair and arthroscopic knee surgery.  SOCIAL HISTORY:  The patient is married.  A nonsmoker.  Social drinker only. No history of tobacco use.  No other drugs of abuse.  PHYSICAL EXAMINATION:  VITAL SIGNS:  Blood pressure 110/56, heart rate 71, temperature 98.7, saturation 96% on room air.  GENERAL:  The patient is a pleasant male who appears to be in no acute distress.  At the time of the exam he has a mild left shoulder pain which is resolving with IV nitroglycerin.  HEENT:  Unremarkable.  NECK:  No neck vein distention.  No carotid bruits.  Good carotid upstroke.  PULMONARY:  Reveals breath sounds which are equal and clear to auscultation. No use of accessory muscles.  CARDIOVASCULAR:  Regular rate and rhythm.  Normal S1.  Normal S2.  There is no rubs, murmurs, or gallops noted.  His PMI is within normal limits.  ABDOMEN:  Soft and nontender.  No bruits are noted.  Normal bowel sounds.  No unusual bruits or pulsations are noted.  EXTREMITIES:  Reveal no peripheral edema.  His distal pulses are equal and palpable.  SKIN: Warm and dry.  NEUROLOGIC:  Nonfocal.  Motor is 5/5 throughout.  LABORATORY DATA:  His ECG reveals a normal sinus rhythm with normal R wave progression.  Normal ECG.  Chest x-ray reveals poor inspiratory effort. Amylase  46, lipase 36, potassium 3.9, creatinine 1.1, glucose 160.  His CBC is still pending.  His CK is 505 with an MB of 60.  His troponin I is 1.51.  IMPRESSION: 1. NonQ wave myocardial infarction.  His left arm pain and chest pain is    resolving.  Will continue with IV nitroglycerin, aspirin, Integrilin and    Lovenox.  The patient will be scheduled for a left heart catheterization    per Dr. Elease Hashimoto unless he becomes more acute. 2. Dyslipidemia.  Resume home Lipitor dose when available. 3. Reflux. He will be continued on his Pepcid 20 mg p.o. q.d. DD:   10/01/00 TD:  10/02/00 Job: [id number]

## 2010-12-17 NOTE — Cardiovascular Report (Signed)
[place]. [facility]  Patient:    Gabriel [name], Gabriel [name] Visit Number: [id number] MRN: [id number]          Service Type: MED Location: 2000 2041 01 Attending Physician:  Koren Bound Dictated by:   Alvia Grove., M.D. Proc. Date: 01/05/02 Admit Date:  01/04/2002 Discharge Date: 01/07/2002   CC:         Thomas A. Patty Sermons, M.D.  Armanda Magic, M.D.   Cardiac Catheterization  INDICATIONS: The patient is a 76 year old gentleman with a history of coronary artery disease, status PTCA of his left anterior descending artery in 1996. He is status post PTCA and stenting of his right coronary artery. He presents tonight with episodes of chest pain and acute ST segment elevation in the inferior leads. He was brought to the catheterization lab and was catheterized by Dr. Armanda Magic. He was found to have a tight 70-80% proximal LAD stenosis. The left circumflex artery was flush occluded in the proximal segment. The right coronary artery had only minor luminal irregularities with a patent stent. I was called to help with PTCA and possible stenting of the left circumflex artery.  DESCRIPTION OF PROCEDURE: The patient was given 5000 units of heparin. This was followed by a double bolus Integrilin drip. The circumflex artery was wired using the trooper guide wire. A 3.0 x 15 mm Quantum Maverick was passed down across the stenosis. A total of six inflations were performed ranging from 4 atmospheres for 18 atmospheres. This resulted in antegrade flow down the circumflex artery with an obvious dissection, which had caused the original myocardial infarction. We were not able to pass an express stent or a Zeta stent. We then tried using a 2.0 x 6 mm Cutting Balloon to try to further dilate the stenosis. This was inflated up to 10 atmospheres for 40 seconds a piece on three occasions. The Zeta stent still would not cross the lesion. At this point, the guide  and wire were traded out. A Voda 3.5 guide was placed into the left main. A second trooper guide wire was positioned down the circumflex artery. This provided much better support. At this point, the Zeta stent passed easily into the stenosis. It was deployed at 10 atmospheres for 43 seconds. This resulted in remarkable improved left circumflex artery. There was some snowplowing of the plaque into the smaller first obtuse marginal artery. At this point, the wire was placed back into the first OM. The 2.0 x 6 mm Cutting Balloon was positioned in the proximal aspect of this vessel and two inflations were performed at 10 atmospheres. This was removed and the Quantum Maverick was positioned back across this OM lesion and was inflated up to 5 atmospheres for 45 seconds. This resulted in a very nice lumen. There was only minor luminal irregularities. The patient tolerated the procedure fairly well.  COMPLICATIONS: None.  CONCLUSIONS: 1. Acute inferolateral myocardial infarction caused by occlusion of the    circumflex artery. 2. Successful percutaneous transluminal coronary angioplasty and stenting    of the left circumflex artery with percutaneous transluminal coronary    angioplasty of the first obtuse marginal artery. The patient has a    relatively tight left anterior descending lesion that will have to be    addressed in the near future. Dictated by:   Alvia Grove., M.D. Attending Physician:  Koren Bound DD:  01/05/02 TD:  01/08/02 Job: [id number]

## 2010-12-17 NOTE — H&P (Signed)
[place]. [facility]  Patient:    Gabriel [name], Gabriel [name] Visit Number: [id number] MRN: [id number]          Service Type: MED Location: 2000 2041 01 Attending Physician:  Koren Bound Dictated by:   Alvia Grove., M.D. Admit Date:  01/04/2002 Discharge Date: 01/07/2002   CC:         Thomas A. Patty Sermons, M.D.   History and Physical  HISTORY OF PRESENT ILLNESS:  The patient is a middle-aged gentleman with a history of coronary artery disease, status post recent inferolateral wall myocardial infarction with subsequent PTCA and stenting of the left circumflex artery.  He was noted to have a tight LAD stenosis at that time.  He is brought back today for a precatheterization evaluation and to consider PTCA of the LAD.  Since he left the hospital several weeks ago he has continued to have some episodes of chest pain, especially with exertion.  He states that the chest pain is described as a tightness.  It typically goes away if he stops and rests.  The pain is not nearly as severe as his myocardial infarction pain.  He was somewhat slow to recover from his infarction because of the large distribution of the circumflex artery, but he says he has done quite well for the past several weeks.  CURRENT MEDICATIONS: 1. Lipitor 10 mg a day. 2. Ecotrin 325 mg a day. 3. Plavix 75 mg a day. 4. Altace 10 mg a day. 5. Norvasc 5 mg a day. 6. Toprol XL 25 mg a day.  ALLERGIES:  No known drug allergies.  PAST MEDICAL HISTORY: 1. Coronary artery disease. 2. Hypertension. 3. Dyslipidemia.  SOCIAL HISTORY:  The patient does not smoke.  Does not drink alcohol.  FAMILY HISTORY:  Unremarkable.  PHYSICAL EXAMINATION:  GENERAL:  Middle-aged gentleman.  In no acute distress.  Alert and oriented x3.  Mood and affect are normal.  VITAL SIGNS:  Weight 216.  Blood pressure 132/90, heart rate 80.  HEENT, NECK:  Reveals 2+ carotids.  He has no bruits.  There  is no JVD.  LUNGS:  Clear to auscultation.  HEART:  Regular rate.  S1, S2.  He has no murmurs, gallops, or rubs.  His PMI is nondisplaced.  ABDOMEN:  Good bowel sounds.  Nontender.  EXTREMITIES:  No clubbing, cyanosis, or edema.  His calf site is well healed.  IMPRESSION AND PLAN:  The patient presents with some episodes of chest pain. He has some known stenosis of his mid left anterior descending.  We will proceed with cardiac catheterization with probable rotational atherectomy, percutaneous transluminal coronary angioplasty and stenting of the left anterior descending.  We discussed the risks, benefits, and options.  He understands and agrees to proceed. Dictated by:   Alvia Grove., M.D. Attending Physician:  Koren Bound DD:  01/23/02 TD:  01/25/02 Job: [id number]

## 2010-12-17 NOTE — Cardiovascular Report (Signed)
[place]. [facility]  Patient:    OTNIEL, HOE Visit Number: [id number] MRN: [id number]          Service Type: CAT Location: 6500 6523 01 Attending Physician:  Koren Bound Dictated by:   Alvia Grove., M.D. Proc. Date: 01/28/02 Admit Date:  01/28/2002   CC:         Thomas A. Patty Sermons, M.D.  Cardiac Catheterization Lab   Cardiac Catheterization  PROCEDURE:  Rotational atherectomy with percutaneous transluminal coronary angioplasty and stenting of the left anterior descending artery.  CARDIOLOGIST:  Alvia Grove., M.D.  INDICATIONS:  Mr. Doty is a 76 year old gentleman with a history of coronary artery disease.  He recently was admitted to the hospital several weeks ago with an acute inferior lateral myocardial infarction.  He had successful percutaneous transluminal coronary angioplasty and stenting of his left circumflex artery.  During that catheterization he was noted to have an eccentric 80% stenosis of the mid LAD.  He was brought back today for the second of the staged procedure after healing up for his acute infarction.  DESCRIPTION OF PROCEDURE:  The patients right femoral artery was easily cannulated using a modified Seldinger technique.  An 8 French sheath was placed.  The left main was engaged using a Judkins left 3.5 8 [place] guide. The patient received 5000 units heparin followed by an additional 2000 units later in the case.  He received double bolus Integrelin drip.  The left anterior descending artery was easily wired using a Rotafloppy guidewire.  A 1.75 mm bur was positioned down the vessel.  We made two passes using this bur at 165,000 RPM for 10 seconds each.  This bur was then removed, and a 2.15 bur was placed down the wire.  A total of four runs was performed using this bur at a speed of 156,000 RPM.  This resulted in a markedly improved vessel lumen.  There was still some eccentric stenosis.   At this point, a 3.0 x 16 mm Express II stent was positioned across the stenosis.  It was deployed at 16 atmospheres for 20 seconds.  This resulted in a very nice angiographic lumen.  There was a very mild amount of spasm of the second diagonal vessel.  The left anterior descending proper had a normal appearance with normal flow.  We were able to place the angioplasty wire down the vessel but could not pass a 2.5 x 12 mm Quantum Maverick. We even tried another Patriot wire but still were not able to pass the 2.5 mm balloon down this vessel.  We decided to stop the procedure at this time since the patient was asymptomatic, and the degree of stenosis was rather focal and was not all that severe.  COMPLICATIONS:  None.  CONCLUSIONS: 1. Successful rotational atherectomy with percutaneous transluminal coronary    angioplasty of the mid left anterior descending. 2. Good intermediate term patency of the circumflex stent. Dictated by:   Alvia Grove., M.D. Attending Physician:  Koren Bound DD:  01/28/02 TD:  01/28/02 Job: [id number]

## 2010-12-17 NOTE — Cardiovascular Report (Signed)
[place]. [facility]  Patient:    Gabriel [name], Gabriel [name]                      MRN: [id number] Proc. Date: 10/01/00 Adm. Date:  [id number] Attending:  Rudean Hitt CC:         Thomas A. Patty Sermons, M.D.   Cardiac Catheterization  PRIMARY CARDIOLOGIST:  Maisie Fus A. Patty Sermons, M.D.  INDICATIONS FOR PROCEDURE:  Ongoing angina associated with positive cardiac enzymes, CKs 500 with an MB of 60.  DESCRIPTION OF PROCEDURE:  After obtaining written informed consent, the patient was brought to the cardiac catheterization lab on an emergent basis. The right groin was prepped and draped in the usual sterile fashion.  Local anesthesia was achieved using 1% Xylocaine.  A 7 French hemostasis sheath was placed into the right femoral artery using modified Seldinger technique. Selective coronary angiography was performed using a JL4, JR4 Judkins catheter.  All catheter exchange were made over a guidewire.  Single plane ventriculogram was performed in the RAO position using a 6 French pigtail curved catheter.  Following review of the films with Dr. Elease Hashimoto, it was felt that angioplasty of the total right coronary artery was indicated.  The sheath was left in place for Dr. Elease Hashimoto to proceed with angioplasty.  FINDINGS:  The aortic pressure is 122/73, LV pressure is 121/24.  VENTRICULOGRAM:  Single plane ventriculogram revealed inferior apical hypokinesis.  Ejection fraction is 50%.  CORONARY ANGIOGRAPHY:  Left main coronary artery:  The left main coronary artery is short.  There was almost a cojoined takeoff of the left anterior descending and circumflex vessel.  There was no disease in the left main coronary artery.  Left anterior descending:  The left anterior descending was a large vessel and gave rise to a moderate diagonal #1, moderate diagonal #2, diagonal #3, and went on to end as an apical recurrent branch.  There was a 40% region noted at the bifurcation of  the diagonal #3.  Circumflex vessel:  The circumflex vessel was a large vessel and gave rise to a large OM-1, large OM-2, and went on to end as an AV groove vessel. There was a 50% ostial lesion noted in the ostium of the first OM-1.  Right coronary artery:  The right coronary artery gave rise to a RV marginal #1, RV marginal #2 and then was totally occluded.  IMPRESSION:  Total occlusion of the mid right coronary artery.  Dr. Elease Hashimoto was consulted.  He will proceed with a angioplasty of the right coronary. DD:  10/01/00 TD:  10/02/00 Job: [id number]

## 2011-01-12 ENCOUNTER — Encounter: Payer: Self-pay | Admitting: Cardiology

## 2011-01-31 ENCOUNTER — Other Ambulatory Visit: Payer: Self-pay | Admitting: *Deleted

## 2011-01-31 DIAGNOSIS — G4700 Insomnia, unspecified: Secondary | ICD-10-CM

## 2011-01-31 MED ORDER — ZOLPIDEM TARTRATE 10 MG PO TABS: 10.0000 mg | ORAL_TABLET | Freq: Every evening | ORAL | Status: DC | PRN

## 2011-01-31 NOTE — Telephone Encounter (Signed)
Refilled meds per fax request. Faxed signed rx back 

## 2011-02-21 ENCOUNTER — Ambulatory Visit (INDEPENDENT_AMBULATORY_CARE_PROVIDER_SITE_OTHER): Payer: Medicare Other | Admitting: Nurse Practitioner

## 2011-02-21 ENCOUNTER — Encounter: Payer: Self-pay | Admitting: Nurse Practitioner

## 2011-02-21 ENCOUNTER — Other Ambulatory Visit (INDEPENDENT_AMBULATORY_CARE_PROVIDER_SITE_OTHER): Payer: Medicare Other | Admitting: *Deleted

## 2011-02-21 DIAGNOSIS — I10 Essential (primary) hypertension: Secondary | ICD-10-CM

## 2011-02-21 DIAGNOSIS — I359 Nonrheumatic aortic valve disorder, unspecified: Secondary | ICD-10-CM

## 2011-02-21 DIAGNOSIS — G629 Polyneuropathy, unspecified: Secondary | ICD-10-CM

## 2011-02-21 DIAGNOSIS — I2510 Atherosclerotic heart disease of native coronary artery without angina pectoris: Secondary | ICD-10-CM

## 2011-02-21 DIAGNOSIS — I351 Nonrheumatic aortic (valve) insufficiency: Secondary | ICD-10-CM

## 2011-02-21 DIAGNOSIS — G589 Mononeuropathy, unspecified: Secondary | ICD-10-CM

## 2011-02-21 DIAGNOSIS — I358 Other nonrheumatic aortic valve disorders: Secondary | ICD-10-CM

## 2011-02-21 LAB — HEPATIC FUNCTION PANEL
ALT: 17 U/L (ref 0–53)
AST: 23 U/L (ref 0–37)
Bilirubin, Direct: 0 mg/dL (ref 0.0–0.3)
Total Bilirubin: 0.8 mg/dL (ref 0.3–1.2)

## 2011-02-21 LAB — LIPID PANEL
LDL Cholesterol: 70 mg/dL (ref 0–99)
Total CHOL/HDL Ratio: 3

## 2011-02-21 LAB — BASIC METABOLIC PANEL
BUN: 16 mg/dL (ref 6–23)
Chloride: 98 mEq/L (ref 96–112)
Potassium: 4 mEq/L (ref 3.5–5.1)

## 2011-02-21 NOTE — Assessment & Plan Note (Signed)
He has history of prior MI with prior PCI to the LAD and LCX. Last nuclear was in April of 2011. He is doing well. No change in his medicines. Labs are checked today. Will see him back in 4 months. Patient is agreeable to this plan and will call if any problems develop in the interim.

## 2011-02-21 NOTE — Progress Notes (Signed)
Gabriel [name] Date of Birth: 1934/09/23   History of Present Illness: Gabriel [name] is seen today for a follow up visit. He is seen for Dr. Patty Sermons. It is his 4 month check. He is doing well from our standpoint. He denies chest pain. No shortness of breath. He remains active. He is mostly limited by his neuropathy. He is not diabetic. His feet are burning more. His fingers are tingling more. He feels like he has bugs crawling on him at times. He is on neurontin but his symptoms seem to be getting worse. He has never been seen by neurology.   Current Outpatient Prescriptions on File Prior to Visit  Medication Sig Dispense Refill  . amLODipine (NORVASC) 5 MG tablet Take 5 mg by mouth daily.        Marland Kitchen aspirin 325 MG EC tablet Take 325 mg by mouth daily.        . clopidogrel (PLAVIX) 75 MG tablet Take 75 mg by mouth daily.        Marland Kitchen gabapentin (NEURONTIN) 800 MG tablet Take 1,200 mg by mouth daily.       Marland Kitchen lisinopril (PRINIVIL,ZESTRIL) 20 MG tablet TAKE 1 TABLET BY MOUTH EVERY DAY  90 tablet  4  . metoprolol succinate (TOPROL-XL) 25 MG 24 hr tablet TAKE 1 TABLET BY MOUTH DAILY  90 tablet  4  . Multiple Vitamin (MULTIVITAMIN) tablet Take 1 tablet by mouth daily.        . niacin-simvastatin (SIMCOR) 1000-20 MG 24 hr tablet Take 1 tablet by mouth at bedtime.        . nitroGLYCERIN (NITROSTAT) 0.4 MG SL tablet Place 0.4 mg under the tongue every 5 (five) minutes as needed.        . sildenafil (VIAGRA) 100 MG tablet Take 100 mg by mouth as needed.        . zolpidem (AMBIEN) 10 MG tablet Take 1 tablet (10 mg total) by mouth at bedtime as needed.  30 tablet  3    Allergies  Allergen Reactions  . Lipitor (Atorvastatin Calcium)   . Mevacor (Lovastatin)     Past Medical History  Diagnosis Date  . Ischemic heart disease     Remote MI with PCI to LAD in 2003; s/p Stent to LCX in 2003  . Aortic valve disease   . Aortic valve sclerosis   . Aortic insufficiency   . Hypertension   . Neuropathy     . Hyperlipidemia     Past Surgical History  Procedure Date  . Cardiac catheterization 2003    Following MI  . Coronary angioplasty 2003    LAD  . Coronary stent placement 2003    LCX    History  Smoking status  . Never Smoker   Smokeless tobacco  . Never Used    History  Alcohol Use No    History reviewed. No pertinent family history.  Review of Systems: The review of systems is positive for neuropathy. He continues to have sinus drainage with vertigo. No cardiac complaints.  All other systems were reviewed and are negative.  Physical Exam: BP 118/72  Pulse 74  Ht 6\' 1"  (1.854 m)  Wt 202 lb 12.8 oz (91.989 kg)  BMI 26.76 kg/m2 Patient is very pleasant and in no acute distress.  He looks younger than his stated age. Skin is warm and dry. Color is normal.  HEENT is unremarkable. Normocephalic/atraumatic. PERRL. Sclera are nonicteric. Neck is supple. No masses. No JVD.  Lungs are clear. Cardiac exam shows a regular rate and rhythm. Abdomen is soft. Extremities are without edema. Gait and ROM are intact. No gross neurologic deficits noted.  LABORATORY DATA: n/a   Assessment / Plan:

## 2011-02-21 NOTE — Assessment & Plan Note (Signed)
Blood pressure looks good. No change in medicines.

## 2011-02-21 NOTE — Patient Instructions (Signed)
Let's get you an appointment with the neurologist to evaluate your neuropathy and consider changing to Lyrica Stay on your current medicines We will see you back in 4 months Call for any problems.

## 2011-02-21 NOTE — Assessment & Plan Note (Signed)
He says this is getting worse. We will refer to Neurology. ? Trial of Lyrica.

## 2011-03-01 ENCOUNTER — Telehealth: Payer: Self-pay | Admitting: Cardiology

## 2011-03-01 NOTE — Telephone Encounter (Signed)
Message copied by Karle Plumber on Tue Mar 01, 2011  1:43 PM ------      Message from: Cassell Clement      Created: Sun Feb 27, 2011  3:37 PM       Please report.The electrolytes are satisfactory.The blood sugar is slightly high at 111.The liver function tests are normal.The lipids are normal except triglycerides are still high 193.  Work harder on low carbohydrates and weight loss.  Continue present medicine

## 2011-03-01 NOTE — Telephone Encounter (Signed)
Lab results reported.

## 2011-03-01 NOTE — Telephone Encounter (Signed)
Message copied by Karle Plumber on Tue Mar 01, 2011  2:40 PM ------      Message from: Cassell Clement      Created: Sun Feb 27, 2011  3:37 PM       Please report.The electrolytes are satisfactory.The blood sugar is slightly high at 111.The liver function tests are normal.The lipids are normal except triglycerides are still high 193.  Work harder on low carbohydrates and weight loss.  Continue present medicine

## 2011-03-01 NOTE — Telephone Encounter (Signed)
Left msg for pt to call back regarding lab work.

## 2011-04-07 ENCOUNTER — Other Ambulatory Visit: Payer: Self-pay | Admitting: Cardiology

## 2011-04-07 DIAGNOSIS — I2510 Atherosclerotic heart disease of native coronary artery without angina pectoris: Secondary | ICD-10-CM

## 2011-04-07 DIAGNOSIS — I10 Essential (primary) hypertension: Secondary | ICD-10-CM

## 2011-04-19 ENCOUNTER — Encounter: Payer: Self-pay | Admitting: Cardiology

## 2011-04-25 LAB — DIFFERENTIAL
Basophils Relative: 0
Eosinophils Absolute: 0
Eosinophils Relative: 0
Monocytes Relative: 7
Neutrophils Relative %: 85 — ABNORMAL HIGH

## 2011-04-25 LAB — CBC
HCT: 37.5 — ABNORMAL LOW
HCT: 37.9 — ABNORMAL LOW
Hemoglobin: 13.3
Hemoglobin: 13.5
Hemoglobin: 14.6
MCHC: 35.7
MCV: 100.8 — ABNORMAL HIGH
RBC: 3.7 — ABNORMAL LOW
RBC: 3.76 — ABNORMAL LOW
RBC: 4.11 — ABNORMAL LOW
WBC: 12.7 — ABNORMAL HIGH
WBC: 17.4 — ABNORMAL HIGH

## 2011-04-25 LAB — LIPID PANEL
Cholesterol: 118
LDL Cholesterol: 56
VLDL: 12

## 2011-04-25 LAB — COMPREHENSIVE METABOLIC PANEL
ALT: 17
ALT: 19
Albumin: 3 — ABNORMAL LOW
Alkaline Phosphatase: 35 — ABNORMAL LOW
Alkaline Phosphatase: 37 — ABNORMAL LOW
BUN: 10
BUN: 12
CO2: 20
CO2: 27
Calcium: 8.2 — ABNORMAL LOW
Chloride: 101
Creatinine, Ser: 1.2
GFR calc Af Amer: >60
GFR calc non Af Amer: 57 — ABNORMAL LOW
GFR calc non Af Amer: 60 — ABNORMAL LOW
Glucose, Bld: 136 — ABNORMAL HIGH
Glucose, Bld: 153 — ABNORMAL HIGH
Glucose, Bld: 238 — ABNORMAL HIGH
Potassium: 4.1
Potassium: 4.1
Sodium: 131 — ABNORMAL LOW
Sodium: 135
Total Bilirubin: 1.1

## 2011-04-25 LAB — URINALYSIS, ROUTINE W REFLEX MICROSCOPIC
Bilirubin Urine: NEGATIVE
Nitrite: NEGATIVE
Specific Gravity, Urine: 1.015
Urobilinogen, UA: 0.2

## 2011-04-25 LAB — LIPASE, BLOOD: Lipase: 25

## 2011-04-25 LAB — URINE MICROSCOPIC-ADD ON

## 2011-04-26 LAB — BASIC METABOLIC PANEL
BUN: 7
Chloride: 102
GFR calc non Af Amer: >60
Potassium: 3.9
Sodium: 135

## 2011-04-26 LAB — PROTIME-INR: Prothrombin Time: 16.6 — ABNORMAL HIGH

## 2011-04-26 LAB — APTT: aPTT: 31

## 2011-05-26 ENCOUNTER — Other Ambulatory Visit: Payer: Self-pay | Admitting: *Deleted

## 2011-05-26 DIAGNOSIS — G4700 Insomnia, unspecified: Secondary | ICD-10-CM

## 2011-05-26 NOTE — Telephone Encounter (Signed)
Zolpidem refill

## 2011-05-29 MED ORDER — ZOLPIDEM TARTRATE 10 MG PO TABS: 10.0000 mg | ORAL_TABLET | Freq: Every evening | ORAL | Status: DC | PRN

## 2011-06-20 ENCOUNTER — Encounter: Payer: Self-pay | Admitting: Cardiology

## 2011-06-20 ENCOUNTER — Other Ambulatory Visit: Payer: Medicare Other | Admitting: *Deleted

## 2011-06-20 ENCOUNTER — Ambulatory Visit (INDEPENDENT_AMBULATORY_CARE_PROVIDER_SITE_OTHER): Payer: Medicare Other | Admitting: Cardiology

## 2011-06-20 VITALS — BP 128/72 | HR 60 | Ht 73.0 in | Wt 211.0 lb

## 2011-06-20 DIAGNOSIS — I119 Hypertensive heart disease without heart failure: Secondary | ICD-10-CM

## 2011-06-20 DIAGNOSIS — G629 Polyneuropathy, unspecified: Secondary | ICD-10-CM

## 2011-06-20 DIAGNOSIS — G4730 Sleep apnea, unspecified: Secondary | ICD-10-CM

## 2011-06-20 DIAGNOSIS — I359 Nonrheumatic aortic valve disorder, unspecified: Secondary | ICD-10-CM

## 2011-06-20 DIAGNOSIS — R063 Periodic breathing: Secondary | ICD-10-CM

## 2011-06-20 DIAGNOSIS — G589 Mononeuropathy, unspecified: Secondary | ICD-10-CM

## 2011-06-20 DIAGNOSIS — E7800 Pure hypercholesterolemia, unspecified: Secondary | ICD-10-CM

## 2011-06-20 LAB — LIPID PANEL
Cholesterol: 164 mg/dL (ref 0–200)
Total CHOL/HDL Ratio: 3
Triglycerides: 204 mg/dL — ABNORMAL HIGH (ref 0.0–149.0)

## 2011-06-20 LAB — HEPATIC FUNCTION PANEL
ALT: 18 U/L (ref 0–53)
AST: 26 U/L (ref 0–37)
Albumin: 4 g/dL (ref 3.5–5.2)
Alkaline Phosphatase: 34 U/L — ABNORMAL LOW (ref 39–117)
Total Protein: 7.2 g/dL (ref 6.0–8.3)

## 2011-06-20 LAB — LDL CHOLESTEROL, DIRECT: Direct LDL: 98.6 mg/dL

## 2011-06-20 MED ORDER — GABAPENTIN 800 MG PO TABS: 800.0000 mg | ORAL_TABLET | Freq: Three times a day (TID) | ORAL | Status: DC

## 2011-06-20 NOTE — Progress Notes (Signed)
Gabriel [name] Date of Birth:  [birth year] [facility] / [facility] [address]   [address] [place], [place]  [address] [phone]           Fax   [phone]  History of Present Illness: This pleasant 76 year old gentleman is seen for a scheduled followup office visit.  He has a history of ischemic heart disease and hypercholesterolemia and essential hypertension.  Had a history of a remote myocardial infarction with PCI to his LAD in 2003 and a stent to his circumflex in 2003.  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  Current Outpatient Prescriptions  Medication Sig Dispense Refill  . amLODipine (NORVASC) 5 MG tablet Take 5 mg by mouth daily.        Marland Kitchen aspirin 325 MG EC tablet Take 325 mg by mouth daily.        . clopidogrel (PLAVIX) 75 MG tablet Take 75 mg by mouth daily.        Marland Kitchen gabapentin (NEURONTIN) 800 MG tablet Take 1 tablet (800 mg total) by mouth 3 (three) times daily.  90 tablet  5  . lisinopril (PRINIVIL,ZESTRIL) 20 MG tablet TAKE 1 TABLET BY MOUTH EVERY DAY  90 tablet  4  . loratadine (CLARITIN) 10 MG tablet Take 10 mg by mouth daily. As needed       . metoprolol succinate (TOPROL-XL) 25 MG 24 hr tablet TAKE 1 TABLET BY MOUTH DAILY  90 tablet  4  . Multiple Vitamin (MULTIVITAMIN) tablet Take 1 tablet by mouth daily.        . niacin-simvastatin (SIMCOR) 1000-20 MG 24 hr tablet Take 1 tablet by mouth at bedtime.        . nitroGLYCERIN (NITROSTAT) 0.4 MG SL tablet Place 0.4 mg under the tongue every 5 (five) minutes as needed.        . sildenafil (VIAGRA) 100 MG tablet Take 100 mg by mouth as needed.        . zolpidem (AMBIEN) 10 MG tablet Take 1 tablet (10 mg total) by mouth at bedtime as needed.  30 tablet  3  . DISCONTD: gabapentin (NEURONTIN) 800 MG tablet Take 800 mg by mouth 3 (three) times daily.         Allergies  Allergen Reactions  . Lipitor (Atorvastatin Calcium)   .  Mevacor (Lovastatin)     Patient Active Problem List  Diagnoses  . Coronary artery disease  . Aortic valve disease  . Aortic valve sclerosis  . Aortic insufficiency  . Hypertension  . Hypercholesterolemia  . Neuropathy    History  Smoking status  . Never Smoker   Smokeless tobacco  . Never Used    History  Alcohol Use No    No family history on file.  Review of Systems: Constitutional: no fever chills diaphoresis or fatigue or change in weight.  Head and neck: no hearing loss, no epistaxis, no photophobia or visual disturbance. Respiratory: No cough, shortness of breath or wheezing. Cardiovascular: No chest pain peripheral edema, palpitations. Gastrointestinal: No abdominal distention, no abdominal pain, no change in bowel habits hematochezia or melena. Genitourinary: No dysuria, no frequency, no urgency, no nocturia. Musculoskeletal:No arthralgias, no back pain, no gait disturbance or myalgias. Neurological: No dizziness, no headaches, no numbness, no seizures, no syncope, no weakness, no tremors. Hematologic: No lymphadenopathy, no easy bruising. Psychiatric: No confusion, no hallucinations, no sleep disturbance.    Physical Exam: [name]  Vitals:   06/20/11 0841  BP: 128/72  Pulse: 60  The head and neck exam reveals pupils equal and reactive.  Extraocular movements are full.  There is no scleral icterus.  The mouth and pharynx are normal.  The neck is supple.  The carotids reveal no bruits.  The jugular venous pressure is normal.  The  thyroid is not enlarged.  There is no lymphadenopathy.  The chest is clear to percussion and auscultation.  There are no rales or rhonchi.  Expansion of the chest is symmetrical.  The precordium is quiet.  The first heart sound is normal.  The second heart sound is physiologically split.  There is no murmur gallop rub or click.  There is no abnormal lift or heave.  The abdomen is soft and nontender.  The bowel sounds are normal.  The liver  and spleen are not enlarged.  There are no abdominal masses.  There are no abdominal bruits.  Extremities reveal good pedal pulses.  There is no phlebitis or edema.  There is no cyanosis or clubbing.  Strength is normal and symmetrical in all extremities.  There is no lateralizing weakness.  There are no sensory deficits.  The skin is warm and dry.  There is no rash.     Assessment / Plan: Continue present medication.  Recheck in 4 months for followup office visit and lab work.

## 2011-06-20 NOTE — Assessment & Plan Note (Signed)
The patient has a history of hypercholesterolemia.  He remains on Simcor.  He has minimal occasional flushing from the simcor.

## 2011-06-20 NOTE — Assessment & Plan Note (Signed)
The patient is on gabapentin 800 mg 3 times a day for his peripheral neuropathy we gave him a new prescription for that today at his request.

## 2011-06-20 NOTE — Assessment & Plan Note (Signed)
Patient exercises regularly.  He has an indoor gym that is available to him is not having exertional dyspnea.  Denies any exertional chest pain.

## 2011-06-20 NOTE — Patient Instructions (Addendum)
Your physician recommends that you continue on your current medications as directed. Please refer to the Current Medication list given to you today. Your physician recommends that you schedule a follow-up appointment in: 4 months with fasting labs (lp/bmet/hfp/keg)

## 2011-06-21 ENCOUNTER — Other Ambulatory Visit: Payer: Self-pay | Admitting: Gastroenterology

## 2011-06-29 ENCOUNTER — Encounter: Payer: Self-pay | Admitting: Cardiology

## 2011-08-08 ENCOUNTER — Other Ambulatory Visit: Payer: Self-pay | Admitting: *Deleted

## 2011-08-08 MED ORDER — CLOPIDOGREL BISULFATE 75 MG PO TABS: 75.0000 mg | ORAL_TABLET | Freq: Every day | ORAL | Status: DC

## 2011-08-08 NOTE — Telephone Encounter (Signed)
Refilled plavix 

## 2011-09-01 ENCOUNTER — Other Ambulatory Visit: Payer: Self-pay | Admitting: *Deleted

## 2011-09-01 MED ORDER — NIACIN-SIMVASTATIN ER 1000-20 MG PO TB24: 1.0000 | ORAL_TABLET | Freq: Every day | ORAL | Status: DC

## 2011-09-01 NOTE — Telephone Encounter (Signed)
Refilled simcor 

## 2011-09-12 ENCOUNTER — Telehealth: Payer: Self-pay | Admitting: Cardiology

## 2011-09-12 NOTE — Telephone Encounter (Signed)
New Problem:     Patient called in wondering if Dr. Patty Sermons would be able to call in some medication to deal with a sinus infection he has. Please call back.

## 2011-09-12 NOTE — Telephone Encounter (Signed)
Pt informed that Dr. Patty Sermons is not in the office.  Suggested pt contact his PCP.  Pt stated Dr. Patty Sermons is his PCP.  Will forward message to Dr. Patty Sermons for review.

## 2011-09-13 MED ORDER — AZITHROMYCIN 250 MG PO TABS: ORAL_TABLET | ORAL | Status: AC

## 2011-09-13 NOTE — Telephone Encounter (Signed)
Left message

## 2011-09-13 NOTE — Telephone Encounter (Signed)
Okay to prescribe a Z-Pak and take Mucinex

## 2011-09-13 NOTE — Telephone Encounter (Signed)
Advised patient, he started Mucinex plain yesterday

## 2011-09-19 ENCOUNTER — Other Ambulatory Visit: Payer: Self-pay | Admitting: *Deleted

## 2011-09-19 DIAGNOSIS — G4700 Insomnia, unspecified: Secondary | ICD-10-CM

## 2011-09-19 MED ORDER — ZOLPIDEM TARTRATE 10 MG PO TABS: 10.0000 mg | ORAL_TABLET | Freq: Every evening | ORAL | Status: DC | PRN

## 2011-09-19 NOTE — Telephone Encounter (Signed)
Refill on zolpidem 

## 2011-10-07 ENCOUNTER — Other Ambulatory Visit (INDEPENDENT_AMBULATORY_CARE_PROVIDER_SITE_OTHER): Payer: Medicare Other

## 2011-10-07 DIAGNOSIS — I119 Hypertensive heart disease without heart failure: Secondary | ICD-10-CM

## 2011-10-07 DIAGNOSIS — I10 Essential (primary) hypertension: Secondary | ICD-10-CM | POA: Diagnosis not present

## 2011-10-07 DIAGNOSIS — G4730 Sleep apnea, unspecified: Secondary | ICD-10-CM

## 2011-10-07 DIAGNOSIS — I2510 Atherosclerotic heart disease of native coronary artery without angina pectoris: Secondary | ICD-10-CM

## 2011-10-07 DIAGNOSIS — E7800 Pure hypercholesterolemia, unspecified: Secondary | ICD-10-CM

## 2011-10-07 DIAGNOSIS — N400 Enlarged prostate without lower urinary tract symptoms: Secondary | ICD-10-CM

## 2011-10-07 DIAGNOSIS — R063 Periodic breathing: Secondary | ICD-10-CM | POA: Diagnosis not present

## 2011-10-07 DIAGNOSIS — I359 Nonrheumatic aortic valve disorder, unspecified: Secondary | ICD-10-CM

## 2011-10-07 DIAGNOSIS — G629 Polyneuropathy, unspecified: Secondary | ICD-10-CM

## 2011-10-07 DIAGNOSIS — G589 Mononeuropathy, unspecified: Secondary | ICD-10-CM

## 2011-10-07 LAB — HEPATIC FUNCTION PANEL
ALT: 15 U/L (ref 0–53)
AST: 22 U/L (ref 0–37)
Albumin: 3.9 g/dL (ref 3.5–5.2)
Alkaline Phosphatase: 35 U/L — ABNORMAL LOW (ref 39–117)
Bilirubin, Direct: 0.1 mg/dL (ref 0.0–0.3)
Total Protein: 6.5 g/dL (ref 6.0–8.3)

## 2011-10-07 LAB — BASIC METABOLIC PANEL
BUN: 16 mg/dL (ref 6–23)
Calcium: 8.8 mg/dL (ref 8.4–10.5)
Chloride: 101 mEq/L (ref 96–112)
Creatinine, Ser: 1.2 mg/dL (ref 0.4–1.5)
GFR: 62.71 mL/min (ref >60.00)

## 2011-10-07 LAB — LIPID PANEL
Cholesterol: 156 mg/dL (ref 0–200)
LDL Cholesterol: 81 mg/dL (ref 0–99)
Triglycerides: 142 mg/dL (ref 0.0–149.0)

## 2011-10-09 NOTE — Progress Notes (Signed)
Quick Note:  Please make copy of labs for patient visit. ______ 

## 2011-10-12 ENCOUNTER — Encounter: Payer: Self-pay | Admitting: Cardiology

## 2011-10-12 ENCOUNTER — Ambulatory Visit (INDEPENDENT_AMBULATORY_CARE_PROVIDER_SITE_OTHER): Payer: Medicare Other | Admitting: Cardiology

## 2011-10-12 ENCOUNTER — Ambulatory Visit
Admission: RE | Admit: 2011-10-12 | Discharge: 2011-10-12 | Disposition: A | Discharge status: Home / Self Care | Payer: Medicare Other | Source: Ambulatory Visit | Attending: Cardiology | Admitting: Cardiology

## 2011-10-12 VITALS — BP 118/74 | HR 78 | Ht 73.0 in | Wt 209.0 lb

## 2011-10-12 DIAGNOSIS — I359 Nonrheumatic aortic valve disorder, unspecified: Secondary | ICD-10-CM | POA: Diagnosis not present

## 2011-10-12 DIAGNOSIS — E7800 Pure hypercholesterolemia, unspecified: Secondary | ICD-10-CM

## 2011-10-12 DIAGNOSIS — I358 Other nonrheumatic aortic valve disorders: Secondary | ICD-10-CM

## 2011-10-12 DIAGNOSIS — I452 Bifascicular block: Secondary | ICD-10-CM

## 2011-10-12 DIAGNOSIS — Z8679 Personal history of other diseases of the circulatory system: Secondary | ICD-10-CM | POA: Diagnosis not present

## 2011-10-12 DIAGNOSIS — N529 Male erectile dysfunction, unspecified: Secondary | ICD-10-CM

## 2011-10-12 DIAGNOSIS — I2510 Atherosclerotic heart disease of native coronary artery without angina pectoris: Secondary | ICD-10-CM

## 2011-10-12 MED ORDER — SILDENAFIL CITRATE 100 MG PO TABS: 100.0000 mg | ORAL_TABLET | ORAL | Status: DC | PRN

## 2011-10-12 NOTE — Progress Notes (Signed)
Gabriel [name] Date of Birth:  Jan 14, 1935 [facility] [address] [place], [place]  [address] [phone]         Fax   [phone]  History of Present Illness: This pleasant 77 year old gentleman is seen for a scheduled followup office visit.  As a history of ischemic heart disease.  He had a myocardial infarction in 2003 treated with PCI to his LAD.  He also had a stent to his circumflex in 2003.  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  He has a history of hypercholesterolemia and essential hypertension.  His last visit he has been very well with no new cardiac symptoms.  Patient also has a past history of kidney stones treated with lithotripsy.  He tries to drink a lot of water to keep the kidney stones from reforming.  He has a history of erectile dysfunction responding to Viagra  Current Outpatient Prescriptions  Medication Sig Dispense Refill  . amLODipine (NORVASC) 5 MG tablet Take 5 mg by mouth daily.        Marland Kitchen aspirin 325 MG EC tablet Take 325 mg by mouth daily.        . clopidogrel (PLAVIX) 75 MG tablet Take 1 tablet (75 mg total) by mouth daily.  90 tablet  3  . gabapentin (NEURONTIN) 800 MG tablet Take 1 tablet (800 mg total) by mouth 3 (three) times daily.  90 tablet  5  . lisinopril (PRINIVIL,ZESTRIL) 20 MG tablet TAKE 1 TABLET BY MOUTH EVERY DAY  90 tablet  4  . loratadine (CLARITIN) 10 MG tablet Take 10 mg by mouth daily. As needed       . metoprolol succinate (TOPROL-XL) 25 MG 24 hr tablet TAKE 1 TABLET BY MOUTH DAILY  90 tablet  4  . Multiple Vitamin (MULTIVITAMIN) tablet Take 1 tablet by mouth daily.        . niacin-simvastatin (SIMCOR) 1000-20 MG 24 hr tablet Take 1 tablet by mouth at bedtime.  30 tablet  11  . nitroGLYCERIN (NITROSTAT) 0.4 MG SL tablet Place 0.4 mg under the tongue every 5 (five) minutes as needed.        . sildenafil (VIAGRA) 100 MG tablet Take 1 tablet  (100 mg total) by mouth as needed.  10 tablet  3  . zolpidem (AMBIEN) 10 MG tablet Take 1 tablet (10 mg total) by mouth at bedtime as needed.  30 tablet  3  . DISCONTD: sildenafil (VIAGRA) 100 MG tablet Take 100 mg by mouth as needed.          Allergies  Allergen Reactions  . Lipitor (Atorvastatin Calcium)   . Mevacor (Lovastatin)     Patient Active Problem List  Diagnoses  . Coronary artery disease  . Aortic valve disease  . Aortic valve sclerosis  . Aortic insufficiency  . Hypertension  . Hypercholesterolemia  . Neuropathy  . RBBB (right bundle branch block with left anterior fascicular block)    History  Smoking status  . Never Smoker   Smokeless tobacco  . Never Used    History  Alcohol Use No    No family history on file.  Review of Systems: Constitutional: no fever chills diaphoresis or fatigue or change in weight.  Head and neck: no hearing loss, no epistaxis, no photophobia or visual disturbance. Respiratory: No cough, shortness of breath or wheezing. Cardiovascular: No chest pain peripheral edema, palpitations. Gastrointestinal: No abdominal  distention, no abdominal pain, no change in bowel habits hematochezia or melena. Genitourinary: No dysuria, no frequency, no urgency, no nocturia. Musculoskeletal:No arthralgias, no back pain, no gait disturbance or myalgias. Neurological: No dizziness, no headaches, no numbness, no seizures, no syncope, no weakness, no tremors. Hematologic: No lymphadenopathy, no easy bruising. Psychiatric: No confusion, no hallucinations, no sleep disturbance.    Physical Exam: Filed Vitals:   10/12/11 0919  BP: 118/74  Pulse: 78   The general appearance reveals a well-developed well-nourished gentleman in no distress.The head and neck exam reveals pupils equal and reactive.  Extraocular movements are full.  There is no scleral icterus.  The mouth and pharynx are normal.  The neck is supple.  The carotids reveal no bruits.  The  jugular venous pressure is normal.  The  thyroid is not enlarged.  There is no lymphadenopathy.  The chest is clear to percussion and auscultation.  There are no rales or rhonchi.  Expansion of the chest is symmetrical.  The precordium is quiet.  The first heart sound is normal.  The second heart sound is physiologically split.  There is no  gallop rub or click.  As a grade 2/6 systolic ejection murmur at the aortic area.  There is no abnormal lift or heave.  The abdomen is soft and nontender.  The bowel sounds are normal.  The liver and spleen are not enlarged.  There are no abdominal masses.  There are no abdominal bruits.  Extremities reveal good pedal pulses.  There is no phlebitis or edema.  There is no cyanosis or clubbing.  Strength is normal and symmetrical in all extremities.  There is no lateralizing weakness.  There are no sensory deficits.  The skin is warm and dry.  There is no rash.  EKG today shows normal sinus rhythm with first degree AV block and right bundle branch block which was also present in 2011.  There is left axis deviation.  Assessment / Plan:  Continue same medication.  Await results of today's labs and chest x-ray.  Recheck in 4 months for followup office visit and fasting lab work

## 2011-10-12 NOTE — Patient Instructions (Signed)
Will have you go for chest xray and call you with the results  Your physician recommends that you continue on your current medications as directed. Please refer to the Current Medication list given to you today.  Your physician wants you to follow-up in: 4 months You will receive a reminder letter in the mail two months in advance. If you don't receive a letter, please call our office to schedule the follow-up appointment.

## 2011-10-12 NOTE — Assessment & Plan Note (Signed)
The patient has a past history of aortic sclerosis with mild to moderate aortic insufficiency noted on echocardiogram in 2010.  He is not having any symptoms of CHF.  We are going to update his chest x-ray today.

## 2011-10-12 NOTE — Assessment & Plan Note (Signed)
The patient has not been expressing any recurrent angina.  He walks briskly on the indoor track at his [employer] 5 days a week and is not having any chest pain or shortness of breath

## 2011-10-12 NOTE — Assessment & Plan Note (Signed)
The patient has a history of hypercholesterolemia treated with some core.  He came for labs last week which were reviewed and they are acceptable.  Patient has not been expressing any myalgias from the statin therapy

## 2011-10-13 ENCOUNTER — Telehealth: Payer: Self-pay | Admitting: *Deleted

## 2011-10-13 NOTE — Telephone Encounter (Signed)
Advised wife of xray

## 2011-10-13 NOTE — Telephone Encounter (Signed)
Message copied by Burnell Blanks on Thu Oct 13, 2011  8:53 AM ------      Message from: Cassell Clement      Created: Wed Oct 12, 2011  8:43 PM       Please report.  Chest xray is okay.  Lungs clear. Heart size upper normal. CSD

## 2011-11-03 ENCOUNTER — Other Ambulatory Visit: Payer: Self-pay | Admitting: Cardiology

## 2011-11-09 DIAGNOSIS — Z125 Encounter for screening for malignant neoplasm of prostate: Secondary | ICD-10-CM | POA: Diagnosis not present

## 2011-11-09 DIAGNOSIS — N3941 Urge incontinence: Secondary | ICD-10-CM | POA: Diagnosis not present

## 2011-11-09 DIAGNOSIS — N400 Enlarged prostate without lower urinary tract symptoms: Secondary | ICD-10-CM | POA: Diagnosis not present

## 2011-11-19 ENCOUNTER — Emergency Department (HOSPITAL_COMMUNITY)
Admission: EM | Admit: 2011-11-19 | Discharge: 2011-11-19 | Disposition: A | Discharge status: Home / Self Care | Payer: Medicare Other | Attending: Emergency Medicine | Admitting: Emergency Medicine

## 2011-11-19 ENCOUNTER — Emergency Department (HOSPITAL_COMMUNITY): Payer: Medicare Other

## 2011-11-19 ENCOUNTER — Encounter (HOSPITAL_COMMUNITY): Payer: Self-pay

## 2011-11-19 DIAGNOSIS — Z79899 Other long term (current) drug therapy: Secondary | ICD-10-CM | POA: Insufficient documentation

## 2011-11-19 DIAGNOSIS — S99921A Unspecified injury of right foot, initial encounter: Secondary | ICD-10-CM

## 2011-11-19 DIAGNOSIS — S8990XA Unspecified injury of unspecified lower leg, initial encounter: Secondary | ICD-10-CM | POA: Diagnosis not present

## 2011-11-19 DIAGNOSIS — S6980XA Other specified injuries of unspecified wrist, hand and finger(s), initial encounter: Secondary | ICD-10-CM | POA: Diagnosis not present

## 2011-11-19 DIAGNOSIS — I10 Essential (primary) hypertension: Secondary | ICD-10-CM | POA: Insufficient documentation

## 2011-11-19 DIAGNOSIS — I2510 Atherosclerotic heart disease of native coronary artery without angina pectoris: Secondary | ICD-10-CM | POA: Diagnosis not present

## 2011-11-19 DIAGNOSIS — M19079 Primary osteoarthritis, unspecified ankle and foot: Secondary | ICD-10-CM | POA: Diagnosis not present

## 2011-11-19 DIAGNOSIS — R109 Unspecified abdominal pain: Secondary | ICD-10-CM | POA: Diagnosis not present

## 2011-11-19 DIAGNOSIS — S99929A Unspecified injury of unspecified foot, initial encounter: Secondary | ICD-10-CM | POA: Insufficient documentation

## 2011-11-19 DIAGNOSIS — S6990XA Unspecified injury of unspecified wrist, hand and finger(s), initial encounter: Secondary | ICD-10-CM | POA: Diagnosis not present

## 2011-11-19 DIAGNOSIS — I259 Chronic ischemic heart disease, unspecified: Secondary | ICD-10-CM | POA: Diagnosis not present

## 2011-11-19 DIAGNOSIS — W2209XA Striking against other stationary object, initial encounter: Secondary | ICD-10-CM | POA: Insufficient documentation

## 2011-11-19 DIAGNOSIS — M79609 Pain in unspecified limb: Secondary | ICD-10-CM | POA: Diagnosis not present

## 2011-11-19 HISTORY — DX: Calculus of kidney: N20.0

## 2011-11-19 LAB — BASIC METABOLIC PANEL
Calcium: 8.9 mg/dL (ref 8.4–10.5)
Creatinine, Ser: 0.99 mg/dL (ref 0.50–1.35)
GFR calc Af Amer: >90 mL/min (ref >90)
Sodium: 136 mEq/L (ref 135–145)

## 2011-11-19 LAB — URINALYSIS, ROUTINE W REFLEX MICROSCOPIC
Ketones, ur: NEGATIVE mg/dL
Leukocytes, UA: NEGATIVE
Nitrite: NEGATIVE
Protein, ur: NEGATIVE mg/dL
Urobilinogen, UA: 1 mg/dL (ref 0.0–1.0)

## 2011-11-19 LAB — CBC
MCH: 36 pg — ABNORMAL HIGH (ref 26.0–34.0)
MCV: 101 fL — ABNORMAL HIGH (ref 78.0–100.0)
Platelets: 175 10*3/uL (ref 150–400)
RBC: 4.05 MIL/uL — ABNORMAL LOW (ref 4.22–5.81)
RDW: 12.6 % (ref 11.5–15.5)
WBC: 7.4 10*3/uL (ref 4.0–10.5)

## 2011-11-19 MED ORDER — SODIUM CHLORIDE 0.9 % IV SOLN
Freq: Once | INTRAVENOUS | Status: AC
  Administered 2011-11-19: 12:00:00 via INTRAVENOUS

## 2011-11-19 MED ORDER — TRAMADOL HCL 50 MG PO TABS: 50.0000 mg | ORAL_TABLET | Freq: Four times a day (QID) | ORAL | Status: AC | PRN

## 2011-11-19 MED ORDER — ONDANSETRON HCL 4 MG/2ML IJ SOLN
4.0000 mg | Freq: Once | INTRAMUSCULAR | Status: AC
  Administered 2011-11-19: 4 mg via INTRAVENOUS
  Filled 2011-11-19: qty 2

## 2011-11-19 NOTE — ED Notes (Signed)
Pt c/o intermittent sharp left flank pain 5/10 x 2 weeks worsen this morning, hx of kidney stone. Positive nausea, no vomiting

## 2011-11-19 NOTE — ED Notes (Signed)
Discharge instructions reviewed w/ pt., verbalizes understanding. One prescription provided at discharge. 

## 2011-11-19 NOTE — ED Provider Notes (Signed)
History     CSN: [id number]  Arrival date & time 11/19/11  1115   First MD Initiated Contact with Patient 11/19/11 1130      Chief Complaint  Patient presents with  . Flank Pain    left    (Consider location/radiation/quality/duration/timing/severity/associated sxs/prior treatment) The history is provided by the patient, the spouse and medical records.  77 y/o M with PMH CAD, HTN, HLD, and prior kidney stone with known 8mm stone in the left kidney presents to ED with c/c of 2 weeks left flank pain. Pain intermittent, typically mild-moderate and aching. This morning, pain became sharp, severe, and radiated to the left side. There was associated nausea but no vomiting. Denies hematuria or difficulty urinating. There has been no fever, chills, CP, abd pain, trouble ambulating, or lightheadedness. No known aggravating or alleviating factors. Pain essentially absent at time of examination. No prior treatment. Pt also notes pain the right 5th toe after hitting it against an object while walking. Pain does not radiate. Mild-moderate in severity, worse with movement or ambulation. There is associated bruising and intermittent tingling. No prior treatment.  Past Medical History  Diagnosis Date  . Ischemic heart disease     Remote MI with PCI to LAD in 2003; s/p Stent to LCX in 2003  . Aortic valve disease   . Aortic valve sclerosis   . Aortic insufficiency   . Hypertension   . Neuropathy   . Hyperlipidemia   . Acute cholecystitis   . Coronary artery disease   . Dyslipidemia   . Acute myocardial infarction   . Kidney stone \    Past Surgical History  Procedure Date  . Cardiac catheterization 2003    Following MI  . Coronary angioplasty 2003    LAD  . Coronary stent placement 2003    LCX    No family history on file.  History  Substance Use Topics  . Smoking status: Never Smoker   . Smokeless tobacco: Never Used  . Alcohol Use: No      Review of Systems 10 systems  reviewed and are negative for acute change except as noted in the HPI.  Allergies  Lipitor and Mevacor  Home Medications   Current Outpatient Rx  Name Route Sig Dispense Refill  . AMLODIPINE BESYLATE 5 MG PO TABS  TAKE 1 TABLET EVERY DAY 90 tablet 3  . ASPIRIN 325 MG PO TBEC Oral Take 325 mg by mouth daily.      Marland Kitchen CLOPIDOGREL BISULFATE 75 MG PO TABS Oral Take 1 tablet (75 mg total) by mouth daily. 90 tablet 3  . GABAPENTIN 800 MG PO TABS Oral Take 1 tablet (800 mg total) by mouth 3 (three) times daily. 90 tablet 5  . LISINOPRIL 20 MG PO TABS  TAKE 1 TABLET BY MOUTH EVERY DAY 90 tablet 4  . LORATADINE 10 MG PO TABS Oral Take 10 mg by mouth daily as needed. For allergies.    Marland Kitchen METOPROLOL SUCCINATE ER 25 MG PO TB24  TAKE 1 TABLET BY MOUTH DAILY 90 tablet 4  . ADULT MULTIVITAMIN W/MINERALS CH Oral Take 1 tablet by mouth daily.    Marland Kitchen NIACIN-SIMVASTATIN ER 1000-20 MG PO TB24 Oral Take 1 tablet by mouth at bedtime. 30 tablet 11  . NITROGLYCERIN 0.4 MG SL SUBL Sublingual Place 0.4 mg under the tongue every 5 (five) minutes as needed. For chest pain.    Marland Kitchen SILDENAFIL CITRATE 100 MG PO TABS Oral Take 1 tablet (100 mg  total) by mouth as needed. 10 tablet 3  . ZOLPIDEM TARTRATE 10 MG PO TABS Oral Take 1 tablet (10 mg total) by mouth at bedtime as needed. 30 tablet 3    BP 133/79  Pulse 81  Temp(Src) 98.9 F (37.2 C) (Oral)  Resp 18  Ht 6\' 1"  (1.854 m)  Wt 207 lb (93.895 kg)  BMI 27.31 kg/m2  SpO2 96%  Physical Exam  Nursing note and vitals reviewed. Constitutional: He is oriented to person, place, and time. He appears well-developed and well-nourished. No distress.  HENT:  Head: Normocephalic and atraumatic.  Right Ear: External ear normal.  Left Ear: External ear normal.  Mouth/Throat: Oropharynx is clear and moist.  Eyes: Conjunctivae are normal. Pupils are equal, round, and reactive to light.  Neck: Normal range of motion. Neck supple.  Cardiovascular: Normal rate, regular rhythm and  intact distal pulses.   Pulmonary/Chest: Effort normal and breath sounds normal. No respiratory distress. He has no wheezes. He exhibits no tenderness.  Abdominal: Soft. Bowel sounds are normal. He exhibits no distension. There is no tenderness. There is no rebound and no guarding.  Genitourinary: Penis normal. Right testis shows no mass, no swelling and no tenderness. Left testis shows no mass, no swelling and no tenderness.       Elevated right testicle, non-tender and unchanged from normal per pt  Musculoskeletal: Normal range of motion. He exhibits tenderness. He exhibits no edema.       TTP to lateral right 5th toe. No pain on palpation of 5th metatarsal. Ecchymosis seen to lateral aspect. Brisk capillary refill.  Entire spine non-tender to palpation. Mild left flank pain on palpation.  Neurological: He is alert and oriented to person, place, and time. No cranial nerve deficit.  Skin: Skin is warm and dry. No pallor.  Psychiatric: He has a normal mood and affect.    ED Course  Procedures (including critical care time)  Labs Reviewed  CBC - Abnormal; Notable for the following:    RBC 4.05 (*)    MCV 101.0 (*)    MCH 36.0 (*)    All other components within normal limits  BASIC METABOLIC PANEL - Abnormal; Notable for the following:    Glucose, Bld 115 (*)    GFR calc non Af Amer 78 (*)    All other components within normal limits  URINALYSIS, ROUTINE W REFLEX MICROSCOPIC   Ct Abdomen Pelvis Wo Contrast  11/19/2011  *RADIOLOGY REPORT*  Clinical Data:  Left-sided flank pain.  Evaluate for stone.  CT ABDOMEN AND PELVIS WITHOUT CONTRAST (CT UROGRAM)  Technique: Contiguous axial images of the abdomen and pelvis without oral or intravenous contrast were obtained.  Comparison: Plain films of 06/01/2010.  Prior CT of 10/28/2007.  Findings:  Exam is limited for evaluation of entities other than urinary tract calculi due to lack of oral or intravenous contrast.   Clear lung bases.  Mild  cardiomegaly with multivessel coronary artery atherosclerosis.  Normal uninfused appearance of the liver, spleen, stomach, pancreas. Cholecystectomy without biliary ductal dilatation.  Normal adrenal glands.  Bilateral renal collecting system calculi. Scattered low density left renal lesions, likely cysts. No hydroureter or ureteric calculi.  No retroperitoneal or retrocrural adenopathy.  Calcification or contrast within the sigmoid diverticulum .  Other scattered diverticula.  Normal terminal ileum.  Appendix is not visualized but there is no evidence of right lower quadrant inflammation. Numerous small jejunal mesenteric nodes are similar and likely reactive.  Small bowel otherwise normal, without  ascites.  Probable inguinal position of the right testicle on image 93.  Surgical changes of at least left inguinal hernia repair. No pelvic adenopathy.  Normal urinary bladder with mild prostatomegaly. No significant free fluid.  Fat containing ventral abdominal wall hernia. No acute osseous abnormality.  IMPRESSION:  1.  Bilateral renal collecting system calculi. No hydroureter or ureteric calculi. 2.  No other explanation for left-sided pain. 3. Multivessel coronary artery atherosclerosis. 4.  Suspect inguinal position of the right testicle.  Consider physical exam correlation.  Original Report Authenticated By: Consuello Bossier, M.D.   Dg Foot Complete Right  11/19/2011  *RADIOLOGY REPORT*  Clinical Data: Foot pain. No trauma history submitted.  RIGHT FOOT COMPLETE - 3+ VIEW  Comparison: None.  Findings: Mild osteopenia.  No acute fracture or dislocation. There is marked enthesopathic change or heterotopic ossification about the plantar fascia.  Vascular calcifications.  Tibiotalar osteoarthritis.  IMPRESSION: Chronic changes as detailed above. No acute osseous abnormality.  Original Report Authenticated By: Consuello Bossier, M.D.     Dx 1: left flank pain Dx 2: Right toe injury   MDM  11:30 AM Pt seen and  evaluated. Initial H&P completed. No pain at this time, will treat nausea. Will check basic labs to assess for renal function, anemia, hematuria, UTI. Will order x-ray of injured foot, likely CT abd/pelvis without contrast to assess kidney stone location given 8mm stone on most recent CT in 2009.   2:36 PM Pt without recurrence of pain. I reviewed the CT scan and x-ray images. No fx to the right toe. CT scan with bilateral collecting system calculi, no signs of obstruction. Right testicular position chronic per pt. Labs without any remarkable findings. Pt will be d/c home with advice to follow-up with PCP for further eval of left flank pain if it persists.        Shaaron Adler, [place] 11/19/11 1439

## 2011-11-19 NOTE — ED Notes (Signed)
Pt reports sharp pain on left flank this AM that was intermittent. Pt reports history of kidney stone on right and states this pain was the same. Pt also reports nausea and reports mild nausea continues. Pt denies blood in urine. Pt sees a urologist and was told he had a stone in left kidney. Pt denies fever or blood in urine. Pt denies pain at this time.

## 2011-11-19 NOTE — Discharge Instructions (Signed)
As we discussed, your kidney stone does not appear to be in a position that would cause your flank pain. The duration and course of your symptoms in combination with today's assessment are not consistent with another emergency cause of your pain. Please follow-up with your doctor for further evaluation if the pain continues. Return to the ER if needed for any new or worsening symptoms.     Flank Pain Flank pain refers to pain that is located on the side of the body between the upper abdomen and the back. It can be caused by many things. CAUSES  Some of the more common causes of flank pain include:  Muscle strain.   Muscle spasms.   A disease of your spine (vertebral disk disease).   A lung infection (pneumonia).   Fluid around your lungs (pulmonary edema).   A kidney infection.   Kidney stones.   A very painful skin rash on only one side of your body (shingles).   Gallbladder disease.  DIAGNOSIS  Blood tests, urine tests, and X-rays may help your caregiver determine what is wrong. TREATMENT  The treatment of pain depends on the cause. Your caregiver will determine what treatment will work best for you. HOME CARE INSTRUCTIONS   Home care will depend on the cause of your pain.   Some medications may help relieve the pain. Take medication for relief of pain as directed by your caregiver.   Tell your caregiver about any changes in your pain.   Follow up with your caregiver.  SEEK IMMEDIATE MEDICAL CARE IF:   Your pain is not controlled with medication.   The pain increases.   You have abdominal pain.   You have shortness of breath.   You have persistent nausea or vomiting.   You have swelling in your abdomen.   You feel faint or pass out.   You have a temperature by mouth above 102 F (38.9 C), not controlled by medicine.  MAKE SURE YOU:   Understand these instructions.   Will watch your condition.   Will get help right away if you are not doing well or get  worse.  Document Released: 09/08/2005 Document Revised: 07/07/2011 Document Reviewed: 01/02/2010 [facility] Patient Information 2012 [place], [place].

## 2011-11-20 NOTE — ED Provider Notes (Signed)
Medical screening examination/treatment/procedure(s) were conducted as a shared visit with non-physician practitioner(s) and myself.  I personally evaluated the patient during the encounter   Gabriel Racer, MD 11/20/11 [phone]

## 2011-12-14 ENCOUNTER — Encounter (HOSPITAL_COMMUNITY): Payer: Self-pay | Admitting: *Deleted

## 2011-12-14 ENCOUNTER — Emergency Department (HOSPITAL_COMMUNITY)
Admission: EM | Admit: 2011-12-14 | Discharge: 2011-12-14 | Disposition: A | Discharge status: Home / Self Care | Payer: Medicare Other | Attending: Emergency Medicine | Admitting: Emergency Medicine

## 2011-12-14 ENCOUNTER — Emergency Department (HOSPITAL_COMMUNITY): Payer: Medicare Other

## 2011-12-14 DIAGNOSIS — I252 Old myocardial infarction: Secondary | ICD-10-CM | POA: Diagnosis not present

## 2011-12-14 DIAGNOSIS — Z79899 Other long term (current) drug therapy: Secondary | ICD-10-CM | POA: Diagnosis not present

## 2011-12-14 DIAGNOSIS — Z87442 Personal history of urinary calculi: Secondary | ICD-10-CM | POA: Insufficient documentation

## 2011-12-14 DIAGNOSIS — R112 Nausea with vomiting, unspecified: Secondary | ICD-10-CM | POA: Insufficient documentation

## 2011-12-14 DIAGNOSIS — R109 Unspecified abdominal pain: Secondary | ICD-10-CM | POA: Diagnosis not present

## 2011-12-14 DIAGNOSIS — I10 Essential (primary) hypertension: Secondary | ICD-10-CM | POA: Insufficient documentation

## 2011-12-14 DIAGNOSIS — E785 Hyperlipidemia, unspecified: Secondary | ICD-10-CM | POA: Diagnosis not present

## 2011-12-14 DIAGNOSIS — R10819 Abdominal tenderness, unspecified site: Secondary | ICD-10-CM | POA: Insufficient documentation

## 2011-12-14 DIAGNOSIS — I2510 Atherosclerotic heart disease of native coronary artery without angina pectoris: Secondary | ICD-10-CM | POA: Diagnosis not present

## 2011-12-14 DIAGNOSIS — R197 Diarrhea, unspecified: Secondary | ICD-10-CM | POA: Diagnosis not present

## 2011-12-14 DIAGNOSIS — K5730 Diverticulosis of large intestine without perforation or abscess without bleeding: Secondary | ICD-10-CM | POA: Diagnosis not present

## 2011-12-14 LAB — URINE MICROSCOPIC-ADD ON

## 2011-12-14 LAB — DIFFERENTIAL
Basophils Absolute: 0 10*3/uL (ref 0.0–0.1)
Basophils Relative: 0 % (ref 0–1)
Lymphocytes Relative: 8 % — ABNORMAL LOW (ref 12–46)
Monocytes Absolute: 0.5 10*3/uL (ref 0.1–1.0)
Neutro Abs: 5.6 10*3/uL (ref 1.7–7.7)
Neutrophils Relative %: 84 % — ABNORMAL HIGH (ref 43–77)

## 2011-12-14 LAB — COMPREHENSIVE METABOLIC PANEL
ALT: 20 U/L (ref 0–53)
AST: 29 U/L (ref 0–37)
Albumin: 3.8 g/dL (ref 3.5–5.2)
Chloride: 101 mEq/L (ref 96–112)
Creatinine, Ser: 1.18 mg/dL (ref 0.50–1.35)
Potassium: 3.9 mEq/L (ref 3.5–5.1)
Sodium: 134 mEq/L — ABNORMAL LOW (ref 135–145)
Total Bilirubin: 0.5 mg/dL (ref 0.3–1.2)

## 2011-12-14 LAB — CBC
MCHC: 35.6 g/dL (ref 30.0–36.0)
Platelets: 147 10*3/uL — ABNORMAL LOW (ref 150–400)
RDW: 12.7 % (ref 11.5–15.5)
WBC: 6.6 10*3/uL (ref 4.0–10.5)

## 2011-12-14 LAB — URINALYSIS, ROUTINE W REFLEX MICROSCOPIC
Glucose, UA: NEGATIVE mg/dL
Ketones, ur: 15 mg/dL — AB
pH: 5.5 (ref 5.0–8.0)

## 2011-12-14 LAB — LACTIC ACID, PLASMA: Lactic Acid, Venous: 1.8 mmol/L (ref 0.5–2.2)

## 2011-12-14 MED ORDER — IOHEXOL 300 MG/ML  SOLN
100.0000 mL | Freq: Once | INTRAMUSCULAR | Status: AC | PRN
  Administered 2011-12-14: 100 mL via INTRAVENOUS

## 2011-12-14 MED ORDER — ONDANSETRON HCL 4 MG/2ML IJ SOLN
4.0000 mg | INTRAMUSCULAR | Status: DC | PRN
  Filled 2011-12-14: qty 2

## 2011-12-14 MED ORDER — ONDANSETRON 4 MG PO TBDP: 4.0000 mg | ORAL_TABLET | Freq: Three times a day (TID) | ORAL | Status: AC | PRN

## 2011-12-14 MED ORDER — SODIUM CHLORIDE 0.9 % IV SOLN
INTRAVENOUS | Status: DC
  Administered 2011-12-14: 10:00:00 via INTRAVENOUS

## 2011-12-14 MED ORDER — IOHEXOL 300 MG/ML  SOLN
20.0000 mL | INTRAMUSCULAR | Status: AC
  Administered 2011-12-14 (×2): 20 mL via ORAL

## 2011-12-14 NOTE — ED Notes (Signed)
Patient here with c/o n/v/d.  Patient states that ever since he was diagnosed with kidney stones he has not felt himself.

## 2011-12-14 NOTE — ED Notes (Signed)
Patient transported to CT 

## 2011-12-14 NOTE — ED Notes (Signed)
MD at bedside. 

## 2011-12-14 NOTE — ED Provider Notes (Signed)
History     CSN: [id number]  Arrival date & time 12/14/11  0909   First MD Initiated Contact with Patient 12/14/11 [phone]      Chief Complaint  Patient presents with  . Nausea     HPI Pt was seen at 0940.  Per pt, c/o gradual onset and persistence of multiple intermittent episodes of N/V/D since yesterday.  Has been associated with left sided abd "pain."  Describes the diarrhea as "watery."  Denies fevers, no rash, no CP/SOB, no testicular pain/swelling, no dysuria/hematuria, no back pain, no black or blood in stools or emesis.    Past Medical History  Diagnosis Date  . Ischemic heart disease     Remote MI with PCI to LAD in 2003; s/p Stent to LCX in 2003  . Aortic valve disease   . Aortic valve sclerosis   . Aortic insufficiency   . Hypertension   . Neuropathy   . Hyperlipidemia   . Acute cholecystitis   . Coronary artery disease   . Dyslipidemia   . Acute myocardial infarction   . Kidney stone \    Past Surgical History  Procedure Date  . Cardiac catheterization 2003    Following MI  . Coronary angioplasty 2003    LAD  . Coronary stent placement 2003    LCX  . Cholecystectomy     History  Substance Use Topics  . Smoking status: Never Smoker   . Smokeless tobacco: Never Used  . Alcohol Use: No    Review of Systems ROS: Statement: All systems negative except as marked or noted in the HPI; Constitutional: Negative for fever and chills. ; ; Eyes: Negative for eye pain, redness and discharge. ; ; ENMT: Negative for ear pain, hoarseness, nasal congestion, sinus pressure and sore throat. ; ; Cardiovascular: Negative for chest pain, palpitations, diaphoresis, dyspnea and peripheral edema. ; ; Respiratory: Negative for cough, wheezing and stridor. ; ; Gastrointestinal: +N/V/D, abd pain. Negative for blood in stool, hematemesis, jaundice and rectal bleeding. . ; ; Genitourinary: Negative for dysuria, flank pain and hematuria. ; ; Musculoskeletal: Negative for back pain and  neck pain. Negative for swelling and trauma.; ; Skin: Negative for pruritus, rash, abrasions, blisters, bruising and skin lesion.; ; Neuro: Negative for headache, lightheadedness and neck stiffness. Negative for weakness, altered level of consciousness , altered mental status, extremity weakness, paresthesias, involuntary movement, seizure and syncope.     Allergies  Lipitor and Mevacor  Home Medications   Current Outpatient Rx  Name Route Sig Dispense Refill  . AMLODIPINE BESYLATE 5 MG PO TABS Oral Take 5 mg by mouth daily.    . ASPIRIN 325 MG PO TBEC Oral Take 325 mg by mouth daily.      Marland Kitchen CLOPIDOGREL BISULFATE 75 MG PO TABS Oral Take 1 tablet (75 mg total) by mouth daily. 90 tablet 3  . GABAPENTIN 800 MG PO TABS Oral Take 1 tablet (800 mg total) by mouth 3 (three) times daily. 90 tablet 5  . LISINOPRIL 20 MG PO TABS Oral Take 20 mg by mouth daily.    Marland Kitchen LORATADINE 10 MG PO TABS Oral Take 10 mg by mouth daily as needed. For allergies.    Marland Kitchen METOPROLOL SUCCINATE ER 25 MG PO TB24 Oral Take 25 mg by mouth daily.    Carma Leaven M PLUS PO TABS Oral Take 1 tablet by mouth daily.    Marland Kitchen NIACIN-SIMVASTATIN ER 1000-20 MG PO TB24 Oral Take 1 tablet by mouth at  bedtime. 30 tablet 11  . NITROGLYCERIN 0.4 MG SL SUBL Sublingual Place 0.4 mg under the tongue every 5 (five) minutes as needed. For chest pain.    Marland Kitchen SILDENAFIL CITRATE 100 MG PO TABS Oral Take 1 tablet (100 mg total) by mouth as needed. 10 tablet 3  . ZOLPIDEM TARTRATE 10 MG PO TABS Oral Take 10 mg by mouth at bedtime.      BP 124/72  Pulse 88  Temp(Src) 98.2 F (36.8 C) (Oral)  Resp 16  SpO2 94%  Physical Exam 0945: Physical examination:  Nursing notes reviewed; Vital signs and O2 SAT reviewed;  Constitutional: Well developed, Well nourished, Well hydrated, In no acute distress; Head:  Normocephalic, atraumatic; Eyes: EOMI, PERRL, No scleral icterus; ENMT: Mouth and pharynx normal, Mucous membranes moist; Neck: Supple, Full range of motion,  No lymphadenopathy; Cardiovascular: Regular rate and rhythm, No gallop; Respiratory: Breath sounds clear & equal bilaterally, No wheezes, Normal respiratory effort/excursion; Chest: Nontender, Movement normal; Abdomen: Soft, +mild TTP LUQ and LLQ, no rebound or guarding. Nondistended, Normal bowel sounds; Extremities: Pulses normal, No tenderness, No edema, No calf edema or asymmetry.; Neuro: AA&Ox3, Major CN grossly intact. Speech clear, no facial droop. No gross focal motor or sensory deficits in extremities.; Skin: Color normal, Warm, Dry, no rash.    ED Course  Procedures    MDM  MDM Reviewed: nursing note, vitals and previous chart Interpretation: labs and CT scan     Results for orders placed during the hospital encounter of 12/14/11  CBC      Component Value Range   WBC 6.6  4.0 - 10.5 (K/uL)   RBC 4.03 (*) 4.22 - 5.81 (MIL/uL)   Hemoglobin 14.4  13.0 - 17.0 (g/dL)   HCT 16.1  09.6 - 04.5 (%)   MCV 100.5 (*) 78.0 - 100.0 (fL)   MCH 35.7 (*) 26.0 - 34.0 (pg)   MCHC 35.6  30.0 - 36.0 (g/dL)   RDW 40.9  81.1 - 91.4 (%)   Platelets 147 (*) 150 - 400 (K/uL)  DIFFERENTIAL      Component Value Range   Neutrophils Relative 84 (*) 43 - 77 (%)   Neutro Abs 5.6  1.7 - 7.7 (K/uL)   Lymphocytes Relative 8 (*) 12 - 46 (%)   Lymphs Abs 0.5 (*) 0.7 - 4.0 (K/uL)   Monocytes Relative 8  3 - 12 (%)   Monocytes Absolute 0.5  0.1 - 1.0 (K/uL)   Eosinophils Relative 0  0 - 5 (%)   Eosinophils Absolute 0.0  0.0 - 0.7 (K/uL)   Basophils Relative 0  0 - 1 (%)   Basophils Absolute 0.0  0.0 - 0.1 (K/uL)  COMPREHENSIVE METABOLIC PANEL      Component Value Range   Sodium 134 (*) 135 - 145 (mEq/L)   Potassium 3.9  3.5 - 5.1 (mEq/L)   Chloride 101  96 - 112 (mEq/L)   CO2 21  19 - 32 (mEq/L)   Glucose, Bld 137 (*) 70 - 99 (mg/dL)   BUN 18  6 - 23 (mg/dL)   Creatinine, Ser 7.82  0.50 - 1.35 (mg/dL)   Calcium 8.7  8.4 - 95.6 (mg/dL)   Total Protein 7.3  6.0 - 8.3 (g/dL)   Albumin 3.8  3.5 - 5.2  (g/dL)   AST 29  0 - 37 (U/L)   ALT 20  0 - 53 (U/L)   Alkaline Phosphatase 43  39 - 117 (U/L)   Total Bilirubin  0.5  0.3 - 1.2 (mg/dL)   GFR calc non Af Amer 59 (*) >90 (mL/min)   GFR calc Af Amer 68 (*) >90 (mL/min)  LIPASE, BLOOD      Component Value Range   Lipase 42  11 - 59 (U/L)  LACTIC ACID, PLASMA      Component Value Range   Lactic Acid, Venous 1.8  0.5 - 2.2 (mmol/L)  PROCALCITONIN      Component Value Range   Procalcitonin 0.25    URINALYSIS, ROUTINE W REFLEX MICROSCOPIC      Component Value Range   Color, Urine AMBER (*) YELLOW    APPearance HAZY (*) CLEAR    Specific Gravity, Urine 1.027  1.005 - 1.030    pH 5.5  5.0 - 8.0    Glucose, UA NEGATIVE  NEGATIVE (mg/dL)   Hgb urine dipstick NEGATIVE  NEGATIVE    Bilirubin Urine SMALL (*) NEGATIVE    Ketones, ur 15 (*) NEGATIVE (mg/dL)   Protein, ur 30 (*) NEGATIVE (mg/dL)   Urobilinogen, UA 0.2  0.0 - 1.0 (mg/dL)   Nitrite NEGATIVE  NEGATIVE    Leukocytes, UA NEGATIVE  NEGATIVE   URINE MICROSCOPIC-ADD ON      Component Value Range   Squamous Epithelial / LPF RARE  RARE    WBC, UA 0-2  <3 (WBC/hpf)   RBC / HPF 0-2  <3 (RBC/hpf)   Bacteria, UA RARE  RARE    Casts HYALINE CASTS (*) NEGATIVE    Urine-Other MUCOUS PRESENT      Ct Abdomen Pelvis W Contrast 12/14/2011  *RADIOLOGY REPORT*  Clinical Data: Nausea, vomiting, abdominal pain.  CT ABDOMEN AND PELVIS WITH CONTRAST  Technique:  Multidetector CT imaging of the abdomen and pelvis was performed following the standard protocol during bolus administration of intravenous contrast.  Contrast: OMNIPAQUE IOHEXOL 300 MG/ML  SOLN  Comparison: 11/19/2011  Findings: Heart is mildly enlarged.  No effusions.  Minimal dependent atelectasis.  Prior cholecystectomy.  Suspect mild fatty infiltration of the liver.  Spleen, pancreas, adrenals are unremarkable.  There is bilateral nephrolithiasis.  The largest stone is in the left upper pole measuring 8 mm.  No ureteral stones or  hydronephrosis.  Urinary bladder is unremarkable.  There is sigmoid diverticulosis.  Pelvic small bowel grossly unremarkable.  No free fluid, free air, or adenopathy. Calcification adjacent to the sigmoid colon, presumably related to old inflammatory process.  No acute bony abnormality.  IMPRESSION: Sigmoid diverticulosis.  No active diverticulitis.  Original Report Authenticated By: Cyndie Chime, M.D.      3:41 PM:  Pt has tol PO well while in the ED.  No N/V.  No stooling while in the ED.  States he wants to go home now.  VS remain stable.  Dx testing d/w pt and family.  Questions answered.  Verb understanding, agreeable to d/c home with outpt f/u.         Laray Anger, DO 12/16/11 2333

## 2011-12-14 NOTE — Discharge Instructions (Signed)
RESOURCE GUIDE  Dental Problems  Patients with Medicaid: [facility]                     [facility] [address]                                           [address] Phone:  [phone]                                                  Phone:  [phone]  If unable to pay or uninsured, contact:  [facility] or [facility] to become qualified for the adult dental clinic.  Chronic Pain Problems Contact [facility]  [phone] Patients need to be referred by their primary care doctor.  Insufficient Money for Medicine Contact [employer]:  call "211" or [facility] 271-5999.  No Primary Care Doctor Call [facility]  [phone] Other agencies that provide inexpensive medical care    [facility]  [phone]    [facility]  [phone]    [facility]  [phone]    Women's Clinic  [phone]    [facility]  [phone]    [facility]  [phone]  Psychological Services [facility]  [phone] [facility]  [phone] [facility]   [phone] (emergency services [phone])  Substance Abuse Resources [facility]  336-882-2125 [facility] 336-784-9470 [facility] 336-285-9073 [facility] 336-845-3988 Residential & Outpatient Substance Abuse Program  800-659-3381  Abuse/Neglect [place] Child Abuse Hotline (336) 641-3795 [place] Child Abuse Hotline 800-378-5315 (After Hours)  Emergency Shelter [facility] (336) 271-5985  Maternity Homes [facility] (336) 275-9566 [facility] (704) 372-4663  MRSA Hotline #:   832-7006    [place] Resources  [facility] [place]     [employer]                          [facility] [address] [place]                       [address]      [address]  [place]                                                [place]                            [place] Phone:  [phone]                                   Phone:  [phone]                 Phone:  [phone]  [facility] Phone:  [phone]    [facility] Child Abuse Hotline 706-502-6501 (813) 857-3777 (After Hours)   Take the prescription as directed.  Increase your fluid intake (ie:  Gatoraide) for the next few days, as discussed.  Eat a bland diet and advance to your regular diet slowly as you can tolerate it.   Avoid full strength juices, as well as milk and milk products until your diarrhea has resolved.   Call your regular medical doctor today to schedule a follow up appointment this week.  Return to the Emergency Department immediately if not improving (or even worsening) despite taking the medicines as prescribed, any black or bloody stool or vomit, if you develop a fever over "101," or for any other concerns.

## 2011-12-15 ENCOUNTER — Telehealth: Payer: Self-pay | Admitting: Cardiology

## 2011-12-15 LAB — URINE CULTURE
Colony Count: NO GROWTH
Culture  Setup Time: 201305151125

## 2011-12-15 NOTE — Telephone Encounter (Signed)
Spoke with pt. Pt states he went to ED 12/14/11 because of watery diarrhea.

## 2011-12-15 NOTE — Telephone Encounter (Signed)
Pt had testing done and was told he had diverticulosis. He was given Zofran and discharged home yesterday. He felt better last night. He states since he woke up this morning he has had 12 watery diarrhea-like stools. He does have some abdominal cramping today. He states he did eat out last weekend.  He does not have a PCP but has seen Dr Matthias Hughs for colonoscopy within the last year.

## 2011-12-15 NOTE — Telephone Encounter (Signed)
Reviewed with Dr Graciela Husbands. He recommended pt contact Dr Matthias Hughs or Urgent Care for further evaluation and possible stool culture.  Pt verbalized  understanding and agreed  with this plan.

## 2011-12-15 NOTE — Telephone Encounter (Signed)
New msg Pt called and he needs to talk to you about ED visit he had yesterday. Please call

## 2011-12-16 DIAGNOSIS — R197 Diarrhea, unspecified: Secondary | ICD-10-CM | POA: Diagnosis not present

## 2011-12-19 DIAGNOSIS — R197 Diarrhea, unspecified: Secondary | ICD-10-CM | POA: Diagnosis not present

## 2011-12-22 DIAGNOSIS — N400 Enlarged prostate without lower urinary tract symptoms: Secondary | ICD-10-CM | POA: Diagnosis not present

## 2011-12-22 DIAGNOSIS — N3941 Urge incontinence: Secondary | ICD-10-CM | POA: Diagnosis not present

## 2011-12-26 ENCOUNTER — Other Ambulatory Visit: Payer: Self-pay | Admitting: Cardiology

## 2011-12-27 DIAGNOSIS — N3941 Urge incontinence: Secondary | ICD-10-CM | POA: Diagnosis not present

## 2011-12-27 DIAGNOSIS — N400 Enlarged prostate without lower urinary tract symptoms: Secondary | ICD-10-CM | POA: Diagnosis not present

## 2011-12-28 NOTE — Telephone Encounter (Signed)
Fax Received. Refill Completed. Gabriel [name] (R.M.A)   

## 2012-01-11 DIAGNOSIS — Z961 Presence of intraocular lens: Secondary | ICD-10-CM | POA: Diagnosis not present

## 2012-01-11 DIAGNOSIS — H52209 Unspecified astigmatism, unspecified eye: Secondary | ICD-10-CM | POA: Diagnosis not present

## 2012-01-11 DIAGNOSIS — H40019 Open angle with borderline findings, low risk, unspecified eye: Secondary | ICD-10-CM | POA: Diagnosis not present

## 2012-01-29 ENCOUNTER — Other Ambulatory Visit: Payer: Self-pay | Admitting: Cardiology

## 2012-01-31 DIAGNOSIS — L821 Other seborrheic keratosis: Secondary | ICD-10-CM | POA: Diagnosis not present

## 2012-01-31 DIAGNOSIS — L570 Actinic keratosis: Secondary | ICD-10-CM | POA: Diagnosis not present

## 2012-01-31 DIAGNOSIS — Z85828 Personal history of other malignant neoplasm of skin: Secondary | ICD-10-CM | POA: Diagnosis not present

## 2012-03-02 ENCOUNTER — Other Ambulatory Visit: Payer: Self-pay | Admitting: Cardiology

## 2012-03-07 ENCOUNTER — Other Ambulatory Visit: Payer: Self-pay | Admitting: *Deleted

## 2012-03-07 DIAGNOSIS — G4700 Insomnia, unspecified: Secondary | ICD-10-CM

## 2012-03-07 MED ORDER — ZOLPIDEM TARTRATE 10 MG PO TABS: 10.0000 mg | ORAL_TABLET | Freq: Every day | ORAL | Status: DC

## 2012-03-07 NOTE — Telephone Encounter (Signed)
Refilled zolpidem for numbers 30 x 4refills

## 2012-03-15 ENCOUNTER — Other Ambulatory Visit (INDEPENDENT_AMBULATORY_CARE_PROVIDER_SITE_OTHER): Payer: Medicare Other

## 2012-03-15 DIAGNOSIS — E7800 Pure hypercholesterolemia, unspecified: Secondary | ICD-10-CM | POA: Diagnosis not present

## 2012-03-15 LAB — HEPATIC FUNCTION PANEL
ALT: 16 U/L (ref 0–53)
Alkaline Phosphatase: 37 U/L — ABNORMAL LOW (ref 39–117)
Bilirubin, Direct: 0.1 mg/dL (ref 0.0–0.3)
Total Bilirubin: 0.9 mg/dL (ref 0.3–1.2)

## 2012-03-15 LAB — BASIC METABOLIC PANEL
Chloride: 102 mEq/L (ref 96–112)
Creatinine, Ser: 1.1 mg/dL (ref 0.4–1.5)
Sodium: 138 mEq/L (ref 135–145)

## 2012-03-15 LAB — LIPID PANEL
HDL: 55 mg/dL (ref >39.00)
LDL Cholesterol: 86 mg/dL (ref 0–99)
Total CHOL/HDL Ratio: 3
Triglycerides: 153 mg/dL — ABNORMAL HIGH (ref 0.0–149.0)

## 2012-03-15 NOTE — Progress Notes (Signed)
Quick Note:  Please make copy of labs for patient visit. ______ 

## 2012-03-16 ENCOUNTER — Other Ambulatory Visit: Payer: Medicare Other

## 2012-03-20 ENCOUNTER — Ambulatory Visit (INDEPENDENT_AMBULATORY_CARE_PROVIDER_SITE_OTHER): Payer: Medicare Other | Admitting: Cardiology

## 2012-03-20 ENCOUNTER — Encounter: Payer: Self-pay | Admitting: Cardiology

## 2012-03-20 VITALS — BP 130/72 | HR 78 | Resp 18 | Ht 73.0 in | Wt 207.8 lb

## 2012-03-20 DIAGNOSIS — I2510 Atherosclerotic heart disease of native coronary artery without angina pectoris: Secondary | ICD-10-CM | POA: Diagnosis not present

## 2012-03-20 DIAGNOSIS — E7800 Pure hypercholesterolemia, unspecified: Secondary | ICD-10-CM | POA: Diagnosis not present

## 2012-03-20 DIAGNOSIS — I119 Hypertensive heart disease without heart failure: Secondary | ICD-10-CM

## 2012-03-20 DIAGNOSIS — G4730 Sleep apnea, unspecified: Secondary | ICD-10-CM | POA: Diagnosis not present

## 2012-03-20 NOTE — Patient Instructions (Addendum)
Your physician recommends that you continue on your current medications as directed. Please refer to the Current Medication list given to you today. Your physician recommends that you schedule a follow-up appointment in: 4 months with fasting labs (lp/bmet/hfp) and EKG  

## 2012-03-20 NOTE — Assessment & Plan Note (Signed)
Since we last saw him the patient was evaluated by neurologist Dr. Vickey Huger.  He has had a very good clinical response to using a CPAP nasal mask.  It does not go over his mouth but only over his nose.  His wife notes that he no longer stops breathing during the night and he himself feels more rested the next day

## 2012-03-20 NOTE — Assessment & Plan Note (Signed)
The patient is on Symbicort for his hypercholesterolemia.  Lab work is satisfactory.

## 2012-03-20 NOTE — Progress Notes (Signed)
Gabriel [name] Date of Birth:  Dec 04, 1934 [facility] [address] [name], [place]  [address] [phone]         Fax   [phone]  History of Present Illness: This pleasant 77 year old gentleman is seen for a scheduled followup office visit. As a history of ischemic heart disease. He had a myocardial infarction in 2003 treated with PCI to his LAD. He also had a stent to his circumflex in 2003. 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 He has a history of hypercholesterolemia and essential hypertension. His last visit he has been very well with no new cardiac symptoms. Patient also has a past history of kidney stones treated with lithotripsy. He tries to drink a lot of water to keep the kidney stones from reforming. He has a history of erectile dysfunction responding to Viagra. Since we last saw him he has done well from the cardiac standpoint.  However he has had a lot of GI issues.  In May he developed severe gastroenteritis and lost about 15 pounds in the space of 3 or 4 days.  He saw Gabriel [name] he.  He is up-to-date on his colonoscopy and endoscopy.  He is still having some abdominal distention and has put himself back on probiotics pending another office visit with Dr. Matthias [name].   Current Outpatient Prescriptions  Medication Sig Dispense Refill  . amLODipine (NORVASC) 5 MG tablet Take 5 mg by mouth daily.      Marland Kitchen aspirin 325 MG EC tablet Take 325 mg by mouth daily.        . clopidogrel (PLAVIX) 75 MG tablet Take 1 tablet (75 mg total) by mouth daily.  90 tablet  3  . Coenzyme Q10 (CO Q 10) 100 MG CAPS Take by mouth.      . gabapentin (NEURONTIN) 800 MG tablet TAKE 1 TABLET BY MOUTH 3 TIMES DAILY  90 tablet  3  . lisinopril (PRINIVIL,ZESTRIL) 20 MG tablet Take 20 mg by mouth daily.      Marland Kitchen lisinopril (PRINIVIL,ZESTRIL) 20 MG tablet TAKE 1 TABLET BY MOUTH EVERY DAY  90 tablet  4  . loratadine  (CLARITIN) 10 MG tablet Take 10 mg by mouth daily as needed. For allergies.      . metoprolol succinate (TOPROL-XL) 25 MG 24 hr tablet Take 25 mg by mouth daily.      . metoprolol succinate (TOPROL-XL) 25 MG 24 hr tablet TAKE 1 TABLET BY MOUTH DAILY  90 tablet  4  . Multiple Vitamins-Minerals (MULTIVITAMINS THER. W/MINERALS) TABS Take 1 tablet by mouth daily.      . niacin-simvastatin (SIMCOR) 1000-20 MG 24 hr tablet Take 1 tablet by mouth at bedtime.  30 tablet  11  . nitroGLYCERIN (NITROSTAT) 0.4 MG SL tablet Place 0.4 mg under the tongue every 5 (five) minutes as needed. For chest pain.      . Probiotic Product (PROBIOTIC DAILY PO) Take 1 capsule by mouth daily.      . sildenafil (VIAGRA) 100 MG tablet Take 1 tablet (100 mg total) by mouth as needed.  10 tablet  3  . zolpidem (AMBIEN) 10 MG tablet Take 1 tablet (10 mg total) by mouth at bedtime.  30 tablet  4    Allergies  Allergen Reactions  . Lipitor (Atorvastatin Calcium) Other (See Comments)    Muscle pain  . Mevacor (Lovastatin) Other (See Comments)    "makes me feel  loopy"    Patient Active Problem List  Diagnosis  . Coronary artery disease  . Aortic valve disease  . Aortic valve sclerosis  . Aortic insufficiency  . Hypertension  . Hypercholesterolemia  . Neuropathy  . RBBB (right bundle branch block with left anterior fascicular block)    History  Smoking status  . Never Smoker   Smokeless tobacco  . Never Used    History  Alcohol Use No    No family history on file.  Review of Systems: Constitutional: no fever chills diaphoresis or fatigue or change in weight.  Head and neck: no hearing loss, no epistaxis, no photophobia or visual disturbance. Respiratory: No cough, shortness of breath or wheezing. Cardiovascular: No chest pain peripheral edema, palpitations. Gastrointestinal: No abdominal distention, no abdominal pain, no change in bowel habits hematochezia or melena. Genitourinary: No dysuria, no  frequency, no urgency, no nocturia. Musculoskeletal:No arthralgias, no back pain, no gait disturbance or myalgias. Neurological: No dizziness, no headaches, no numbness, no seizures, no syncope, no weakness, no tremors. Hematologic: No lymphadenopathy, no easy bruising. Psychiatric: No confusion, no hallucinations, no sleep disturbance.    Physical Exam: Filed Vitals:   03/20/12 1402  BP: 130/72  Pulse: 78  Resp: 18   the general appearance reveals a well-developed well-nourished gentleman in no distress.The head and neck exam reveals pupils equal and reactive.  Extraocular movements are full.  There is no scleral icterus.  The mouth and pharynx are normal.  The neck is supple.  The carotids reveal no bruits.  The jugular venous pressure is normal.  The  thyroid is not enlarged.  There is no lymphadenopathy.  The chest is clear to percussion and auscultation.  There are no rales or rhonchi.  Expansion of the chest is symmetrical.  The precordium is quiet.  The first heart sound is normal.  The second heart sound is physiologically split.  There is no murmur gallop rub or click.  There is no abnormal lift or heave.  The abdomen is soft and nontender.  The bowel sounds are normal.  The liver and spleen are not enlarged.  There are no abdominal masses.  There are no abdominal bruits.  The bowel sounds are soft. Extremities reveal good pedal pulses.  There is no phlebitis or edema.  There is no cyanosis or clubbing.  Strength is normal and symmetrical in all extremities.  There is no lateralizing weakness.  There are no sensory deficits.  The skin is warm and dry.  There is no rash.     Assessment / Plan: Patient will continue same medication.  Recheck in 4 months for followup office visit EKG lipid panel hepatic function panel and basal metabolic panel

## 2012-03-20 NOTE — Assessment & Plan Note (Signed)
The patient has not had any recurrent chest pain or angina.  He exercises walking on the track on a daily basis.

## 2012-04-21 ENCOUNTER — Other Ambulatory Visit: Payer: Self-pay | Admitting: Cardiology

## 2012-05-01 DIAGNOSIS — R143 Flatulence: Secondary | ICD-10-CM | POA: Diagnosis not present

## 2012-05-01 DIAGNOSIS — R141 Gas pain: Secondary | ICD-10-CM | POA: Diagnosis not present

## 2012-07-02 DIAGNOSIS — R1013 Epigastric pain: Secondary | ICD-10-CM | POA: Diagnosis not present

## 2012-07-02 DIAGNOSIS — R1033 Periumbilical pain: Secondary | ICD-10-CM | POA: Diagnosis not present

## 2012-07-02 DIAGNOSIS — R141 Gas pain: Secondary | ICD-10-CM | POA: Diagnosis not present

## 2012-07-13 ENCOUNTER — Other Ambulatory Visit (INDEPENDENT_AMBULATORY_CARE_PROVIDER_SITE_OTHER): Payer: Medicare Other

## 2012-07-13 DIAGNOSIS — I119 Hypertensive heart disease without heart failure: Secondary | ICD-10-CM | POA: Diagnosis not present

## 2012-07-13 DIAGNOSIS — E7800 Pure hypercholesterolemia, unspecified: Secondary | ICD-10-CM | POA: Diagnosis not present

## 2012-07-13 LAB — LIPID PANEL
Cholesterol: 170 mg/dL (ref 0–200)
HDL: 45.8 mg/dL (ref >39.00)

## 2012-07-13 LAB — BASIC METABOLIC PANEL
BUN: 17 mg/dL (ref 6–23)
Calcium: 9 mg/dL (ref 8.4–10.5)
GFR: 65.73 mL/min (ref >60.00)
Glucose, Bld: 113 mg/dL — ABNORMAL HIGH (ref 70–99)

## 2012-07-13 LAB — HEPATIC FUNCTION PANEL
ALT: 17 U/L (ref 0–53)
AST: 23 U/L (ref 0–37)
Bilirubin, Direct: 0.1 mg/dL (ref 0.0–0.3)
Total Bilirubin: 0.9 mg/dL (ref 0.3–1.2)

## 2012-07-15 NOTE — Progress Notes (Signed)
Quick Note:  Please make copy of labs for patient visit. ______ 

## 2012-07-17 ENCOUNTER — Ambulatory Visit (INDEPENDENT_AMBULATORY_CARE_PROVIDER_SITE_OTHER): Payer: Medicare Other | Admitting: Cardiology

## 2012-07-17 ENCOUNTER — Encounter: Payer: Self-pay | Admitting: Cardiology

## 2012-07-17 VITALS — BP 140/77 | HR 75 | Ht 73.0 in | Wt 210.0 lb

## 2012-07-17 DIAGNOSIS — E7800 Pure hypercholesterolemia, unspecified: Secondary | ICD-10-CM

## 2012-07-17 DIAGNOSIS — I2510 Atherosclerotic heart disease of native coronary artery without angina pectoris: Secondary | ICD-10-CM

## 2012-07-17 DIAGNOSIS — I259 Chronic ischemic heart disease, unspecified: Secondary | ICD-10-CM | POA: Diagnosis not present

## 2012-07-17 DIAGNOSIS — I350 Nonrheumatic aortic (valve) stenosis: Secondary | ICD-10-CM

## 2012-07-17 DIAGNOSIS — I359 Nonrheumatic aortic valve disorder, unspecified: Secondary | ICD-10-CM

## 2012-07-17 DIAGNOSIS — E785 Hyperlipidemia, unspecified: Secondary | ICD-10-CM | POA: Diagnosis not present

## 2012-07-17 MED ORDER — ROSUVASTATIN CALCIUM 5 MG PO TABS: 5.0000 mg | ORAL_TABLET | Freq: Every day | ORAL | Status: DC

## 2012-07-17 NOTE — Assessment & Plan Note (Signed)
The patient has not been experiencing any symptoms of congestive heart failure.  Exercise tolerance is good. 

## 2012-07-17 NOTE — Patient Instructions (Addendum)
STOP SIMCOR AND START CRESTOR 5 MG DAILY, RX SENT TO [facility]  Decrease your Aspirin to 81 mg daily  Your physician wants you to follow-up in: 4 months with fasting labs (lp/bmet/hfp) You will receive a reminder letter in the mail two months in advance. If you don't receive a letter, please call our office to schedule the follow-up appointment.

## 2012-07-17 NOTE — Assessment & Plan Note (Signed)
Patient has not been experiencing any recurrence of his angina pectoris or symptoms of coronary artery disease.

## 2012-07-17 NOTE — Progress Notes (Signed)
Gabriel [name] Date of Birth:  06-Jun-1935 [facility] [address] [place], [place]  [address] [phone]         Fax   [phone]  History of Present Illness: This pleasant 77 year old gentleman is seen for a scheduled followup office visit. As a history of ischemic heart disease. He had a myocardial infarction in 2003 treated with PCI to his LAD. He also had a stent to his circumflex in 2003. 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 He has a history of hypercholesterolemia and essential hypertension. His last visit he has been very well with no new cardiac symptoms. Patient also has a past history of kidney stones treated with lithotripsy. He tries to drink a lot of water to keep the kidney stones from reforming. He has a history of erectile dysfunction responding to Viagra.  He has been having a lot of GI issues concerning abdominal and gastric bloating and is seen his gastroenterologist Dr. Matthias Hughs.    Current Outpatient Prescriptions  Medication Sig Dispense Refill  . amLODipine (NORVASC) 5 MG tablet Take 5 mg by mouth daily.      Marland Kitchen aspirin 81 MG tablet Take 81 mg by mouth daily.      . clopidogrel (PLAVIX) 75 MG tablet Take 1 tablet (75 mg total) by mouth daily.  90 tablet  3  . Coenzyme Q10 (CO Q 10) 100 MG CAPS Take by mouth.      . gabapentin (NEURONTIN) 800 MG tablet TAKE 1 TABLET BY MOUTH 3 TIMES DAILY  90 tablet  6  . lisinopril (PRINIVIL,ZESTRIL) 20 MG tablet TAKE 1 TABLET BY MOUTH EVERY DAY  90 tablet  4  . metoprolol succinate (TOPROL-XL) 25 MG 24 hr tablet TAKE 1 TABLET BY MOUTH DAILY  90 tablet  4  . Multiple Vitamins-Minerals (MULTIVITAMINS THER. W/MINERALS) TABS Take 1 tablet by mouth daily.      . sildenafil (VIAGRA) 100 MG tablet Take 1 tablet (100 mg total) by mouth as needed.  10 tablet  3  . zolpidem (AMBIEN) 10 MG tablet Take 1 tablet (10 mg total) by mouth at  bedtime.  30 tablet  4  . loratadine (CLARITIN) 10 MG tablet Take 10 mg by mouth daily as needed. For allergies.      . nitroGLYCERIN (NITROSTAT) 0.4 MG SL tablet Place 0.4 mg under the tongue every 5 (five) minutes as needed. For chest pain.      . rosuvastatin (CRESTOR) 5 MG tablet Take 1 tablet (5 mg total) by mouth daily.  90 tablet  3    Allergies  Allergen Reactions  . Lipitor (Atorvastatin Calcium) Other (See Comments)    Muscle pain  . Mevacor (Lovastatin) Other (See Comments)    "makes me feel loopy"    Patient Active Problem List  Diagnosis  . Coronary artery disease  . Aortic valve disease  . Aortic valve sclerosis  . Aortic insufficiency  . Hypertension  . Hypercholesterolemia  . Neuropathy  . RBBB (right bundle branch block with left anterior fascicular block)  . Sleep apnea    History  Smoking status  . Never Smoker   Smokeless tobacco  . Never Used    History  Alcohol Use No    No family history on file.  Review of Systems: Constitutional: no fever chills diaphoresis or fatigue or change in weight.  Head and neck: no hearing loss, no epistaxis, no  photophobia or visual disturbance. Respiratory: No cough, shortness of breath or wheezing. Cardiovascular: No chest pain peripheral edema, palpitations. Gastrointestinal: No abdominal distention, no abdominal pain, no change in bowel habits hematochezia or melena. Genitourinary: No dysuria, no frequency, no urgency, no nocturia. Musculoskeletal:No arthralgias, no back pain, no gait disturbance or myalgias. Neurological: No dizziness, no headaches, no numbness, no seizures, no syncope, no weakness, no tremors. Hematologic: No lymphadenopathy, no easy bruising. Psychiatric: No confusion, no hallucinations, no sleep disturbance.    Physical Exam: Filed Vitals:   07/17/12 1034  BP: 140/77  Pulse: 75   the general appearance reveals a well-developed well-nourished gentleman in no distress.The head and  neck exam reveals pupils equal and reactive.  Extraocular movements are full.  There is no scleral icterus.  The mouth and pharynx are normal.  The neck is supple.  The carotids reveal no bruits.  The jugular venous pressure is normal.  The  thyroid is not enlarged.  There is no lymphadenopathy.  The chest is clear to percussion and auscultation.  There are no rales or rhonchi.  Expansion of the chest is symmetrical.  The precordium is quiet.  The first heart sound is normal.  The second heart sound is physiologically split.  There is grade 2/6 systolic ejection murmur at the base.  No diastolic murmur heard  There is no abnormal lift or heave.  The abdomen is soft there is slight tenderness in the left lower quadrant.  The bowel sounds are normal.  The liver and spleen are not enlarged.  There are no abdominal masses.  There are no abdominal bruits.  Extremities reveal good pedal pulses.  There is no phlebitis or edema.  There is no cyanosis or clubbing.  Strength is normal and symmetrical in all extremities.  There is no lateralizing weakness.  There are no sensory deficits.  The skin is warm and dry.  There is no rash.     Assessment / Plan: Continue same medication except stop some core and start Crestor 5 mg daily.  He may now decrease his aspirin to just 81 mg daily. Recheck in 4 months for followup office visit EKG lipid panel hepatic function panel and basal metabolic panel

## 2012-07-17 NOTE — Assessment & Plan Note (Signed)
The patient has been on some core for his hypercholesterolemia.  The patient has had some glucose intolerance and a lot of GI issues.  Is possible that the niacin component of some core may be contributing and we will stop Symbicort this point and switch him to Crestor 5 mg 1 daily

## 2012-07-23 ENCOUNTER — Other Ambulatory Visit: Payer: Self-pay

## 2012-07-23 MED ORDER — CLOPIDOGREL BISULFATE 75 MG PO TABS: 75.0000 mg | ORAL_TABLET | Freq: Every day | ORAL | Status: DC

## 2012-08-08 ENCOUNTER — Other Ambulatory Visit: Payer: Self-pay | Admitting: *Deleted

## 2012-08-08 DIAGNOSIS — G4700 Insomnia, unspecified: Secondary | ICD-10-CM

## 2012-08-08 MED ORDER — ZOLPIDEM TARTRATE 10 MG PO TABS: 10.0000 mg | ORAL_TABLET | Freq: Every day | ORAL | Status: DC

## 2012-08-28 DIAGNOSIS — R141 Gas pain: Secondary | ICD-10-CM | POA: Diagnosis not present

## 2012-08-28 DIAGNOSIS — K219 Gastro-esophageal reflux disease without esophagitis: Secondary | ICD-10-CM | POA: Diagnosis not present

## 2012-08-28 DIAGNOSIS — R142 Eructation: Secondary | ICD-10-CM | POA: Diagnosis not present

## 2012-09-04 DIAGNOSIS — I359 Nonrheumatic aortic valve disorder, unspecified: Secondary | ICD-10-CM | POA: Diagnosis not present

## 2012-09-04 DIAGNOSIS — G609 Hereditary and idiopathic neuropathy, unspecified: Secondary | ICD-10-CM | POA: Diagnosis not present

## 2012-09-04 DIAGNOSIS — G4733 Obstructive sleep apnea (adult) (pediatric): Secondary | ICD-10-CM | POA: Diagnosis not present

## 2012-09-05 DIAGNOSIS — B356 Tinea cruris: Secondary | ICD-10-CM | POA: Diagnosis not present

## 2012-09-05 DIAGNOSIS — R21 Rash and other nonspecific skin eruption: Secondary | ICD-10-CM | POA: Diagnosis not present

## 2012-09-15 ENCOUNTER — Other Ambulatory Visit: Payer: Self-pay

## 2012-10-17 ENCOUNTER — Other Ambulatory Visit: Payer: Self-pay

## 2012-10-17 MED ORDER — AMLODIPINE BESYLATE 5 MG PO TABS: 5.0000 mg | ORAL_TABLET | Freq: Every day | ORAL | Status: DC

## 2012-10-31 ENCOUNTER — Other Ambulatory Visit: Payer: Self-pay | Admitting: Gastroenterology

## 2012-10-31 DIAGNOSIS — R109 Unspecified abdominal pain: Secondary | ICD-10-CM

## 2012-10-31 DIAGNOSIS — R142 Eructation: Secondary | ICD-10-CM | POA: Diagnosis not present

## 2012-10-31 DIAGNOSIS — R1033 Periumbilical pain: Secondary | ICD-10-CM | POA: Diagnosis not present

## 2012-11-05 ENCOUNTER — Ambulatory Visit
Admission: RE | Admit: 2012-11-05 | Discharge: 2012-11-05 | Disposition: A | Discharge status: Home / Self Care | Payer: Medicare Other | Source: Ambulatory Visit | Attending: Gastroenterology | Admitting: Gastroenterology

## 2012-11-05 ENCOUNTER — Encounter: Payer: Self-pay | Admitting: Cardiology

## 2012-11-05 DIAGNOSIS — K5730 Diverticulosis of large intestine without perforation or abscess without bleeding: Secondary | ICD-10-CM | POA: Diagnosis not present

## 2012-11-05 DIAGNOSIS — K439 Ventral hernia without obstruction or gangrene: Secondary | ICD-10-CM | POA: Diagnosis not present

## 2012-11-05 DIAGNOSIS — R109 Unspecified abdominal pain: Secondary | ICD-10-CM

## 2012-11-05 DIAGNOSIS — N200 Calculus of kidney: Secondary | ICD-10-CM | POA: Diagnosis not present

## 2012-11-05 MED ORDER — IOHEXOL 300 MG/ML  SOLN
125.0000 mL | Freq: Once | INTRAMUSCULAR | Status: AC | PRN
  Administered 2012-11-05: 125 mL via INTRAVENOUS

## 2012-11-06 ENCOUNTER — Encounter: Payer: Self-pay | Admitting: Cardiology

## 2012-11-12 DIAGNOSIS — M1710 Unilateral primary osteoarthritis, unspecified knee: Secondary | ICD-10-CM | POA: Diagnosis not present

## 2012-11-15 ENCOUNTER — Other Ambulatory Visit (INDEPENDENT_AMBULATORY_CARE_PROVIDER_SITE_OTHER): Payer: Medicare Other

## 2012-11-15 DIAGNOSIS — E785 Hyperlipidemia, unspecified: Secondary | ICD-10-CM | POA: Diagnosis not present

## 2012-11-15 LAB — BASIC METABOLIC PANEL
BUN: 23 mg/dL (ref 6–23)
Calcium: 8.9 mg/dL (ref 8.4–10.5)
GFR: 63.13 mL/min (ref >60.00)
Glucose, Bld: 90 mg/dL (ref 70–99)
Potassium: 3.9 mEq/L (ref 3.5–5.1)

## 2012-11-15 LAB — HEPATIC FUNCTION PANEL
ALT: 24 U/L (ref 0–53)
AST: 27 U/L (ref 0–37)
Alkaline Phosphatase: 37 U/L — ABNORMAL LOW (ref 39–117)
Bilirubin, Direct: 0 mg/dL (ref 0.0–0.3)
Total Bilirubin: 0.5 mg/dL (ref 0.3–1.2)

## 2012-11-15 NOTE — Progress Notes (Signed)
Quick Note:  Please make copy of labs for patient visit. ______ 

## 2012-11-19 ENCOUNTER — Other Ambulatory Visit: Payer: Self-pay | Admitting: *Deleted

## 2012-11-19 ENCOUNTER — Ambulatory Visit (INDEPENDENT_AMBULATORY_CARE_PROVIDER_SITE_OTHER): Payer: Medicare Other | Admitting: Cardiology

## 2012-11-19 ENCOUNTER — Encounter: Payer: Self-pay | Admitting: Cardiology

## 2012-11-19 VITALS — BP 138/72 | HR 55 | Ht 73.0 in | Wt 204.6 lb

## 2012-11-19 DIAGNOSIS — I119 Hypertensive heart disease without heart failure: Secondary | ICD-10-CM | POA: Diagnosis not present

## 2012-11-19 DIAGNOSIS — I2510 Atherosclerotic heart disease of native coronary artery without angina pectoris: Secondary | ICD-10-CM

## 2012-11-19 DIAGNOSIS — E7800 Pure hypercholesterolemia, unspecified: Secondary | ICD-10-CM

## 2012-11-19 DIAGNOSIS — I10 Essential (primary) hypertension: Secondary | ICD-10-CM | POA: Diagnosis not present

## 2012-11-19 DIAGNOSIS — I359 Nonrheumatic aortic valve disorder, unspecified: Secondary | ICD-10-CM

## 2012-11-19 MED ORDER — GABAPENTIN 800 MG PO TABS: 800.0000 mg | ORAL_TABLET | Freq: Three times a day (TID) | ORAL | Status: DC

## 2012-11-19 NOTE — Patient Instructions (Addendum)
Your physician recommends that you continue on your current medications as directed. Please refer to the Current Medication list given to you today.  Your physician recommends that you schedule a follow-up appointment in: 4 months with fasting labs (lp/bmet/hfp)  

## 2012-11-19 NOTE — Assessment & Plan Note (Signed)
The patient has not had any symptoms of congestive heart failure or angina pectoris or exertional dizziness or syncope related to his aortic valve.

## 2012-11-19 NOTE — Assessment & Plan Note (Signed)
Blood pressure was remaining stable on current medications.  Dr. Matthias Hughs has mentioned to the patient that we may need to consider stopping his amlodipine to see if it is causing abdominal bloating.  This would be okay from our standpoint and we could adjust his other blood pressure medicines to make up the difference.  He has an appointment next month to see him and they will discuss further

## 2012-11-19 NOTE — Progress Notes (Signed)
Gabriel [name] Date of Birth:  1934-11-10 [facility] [address] [place], [place]  [address] [phone]         Fax   [phone]  History of Present Illness: This pleasant 78 year old gentleman is seen for a scheduled followup office visit.  The patient has a history of ischemic heart disease. He had a myocardial infarction in 2003 treated with PCI to his LAD. He also had a stent to his circumflex in 2003. 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 He has a history of hypercholesterolemia and essential hypertension.  Since last visit he has been very well with no new cardiac symptoms. Patient also has a past history of kidney stones treated with lithotripsy. He tries to drink a lot of water to keep the kidney stones from reforming. He has a history of erectile dysfunction responding to Viagra. He has been having a lot of GI issues concerning abdominal and gastric bloating and is seen his gastroenterologist Dr. Matthias Hughs.  He recently had a CT scan of his abdomen which did not reveal any new findings.   Current Outpatient Prescriptions  Medication Sig Dispense Refill  . amLODipine (NORVASC) 5 MG tablet Take 1 tablet (5 mg total) by mouth daily.  30 tablet  6  . aspirin 81 MG tablet Take 81 mg by mouth daily.      . clopidogrel (PLAVIX) 75 MG tablet Take 1 tablet (75 mg total) by mouth daily.  90 tablet  3  . Coenzyme Q10 (CO Q 10) 100 MG CAPS Take by mouth.      . gabapentin (NEURONTIN) 800 MG tablet Take 1 tablet (800 mg total) by mouth 3 (three) times daily.  90 tablet  3  . lisinopril (PRINIVIL,ZESTRIL) 20 MG tablet TAKE 1 TABLET BY MOUTH EVERY DAY  90 tablet  4  . loratadine (CLARITIN) 10 MG tablet Take 10 mg by mouth daily as needed. For allergies.      . metoprolol succinate (TOPROL-XL) 25 MG 24 hr tablet TAKE 1 TABLET BY MOUTH DAILY  90 tablet  4  . Multiple Vitamins-Minerals (MULTIVITAMINS  THER. W/MINERALS) TABS Take 1 tablet by mouth daily.      . nitroGLYCERIN (NITROSTAT) 0.4 MG SL tablet Place 0.4 mg under the tongue every 5 (five) minutes as needed. For chest pain.      . rosuvastatin (CRESTOR) 5 MG tablet Take 1 tablet (5 mg total) by mouth daily.  90 tablet  3  . sildenafil (VIAGRA) 100 MG tablet Take 1 tablet (100 mg total) by mouth as needed.  10 tablet  3  . zolpidem (AMBIEN) 10 MG tablet Take 1 tablet (10 mg total) by mouth at bedtime.  30 tablet  5   No current facility-administered medications for this visit.    Allergies  Allergen Reactions  . Lipitor (Atorvastatin Calcium) Other (See Comments)    Muscle pain  . Mevacor (Lovastatin) Other (See Comments)    "makes me feel loopy"    Patient Active Problem List  Diagnosis  . Coronary artery disease  . Aortic valve disease  . Aortic valve sclerosis  . Aortic insufficiency  . Hypertension  . Hypercholesterolemia  . Neuropathy  . RBBB (right bundle branch block with left anterior fascicular block)  . Sleep apnea    History  Smoking status  . Never Smoker   Smokeless tobacco  . Never Used    History  Alcohol Use No    No family history on file.  Review of Systems: Constitutional: no fever chills diaphoresis or fatigue or change in weight.  Head and neck: no hearing loss, no epistaxis, no photophobia or visual disturbance. Respiratory: No cough, shortness of breath or wheezing. Cardiovascular: No chest pain peripheral edema, palpitations. Gastrointestinal: No abdominal distention, no abdominal pain, no change in bowel habits hematochezia or melena. Genitourinary: No dysuria, no frequency, no urgency, no nocturia. Musculoskeletal:No arthralgias, no back pain, no gait disturbance or myalgias. Neurological: No dizziness, no headaches, no numbness, no seizures, no syncope, no weakness, no tremors. Hematologic: No lymphadenopathy, no easy bruising. Psychiatric: No confusion, no hallucinations, no  sleep disturbance.    Physical Exam: Filed Vitals:   11/19/12 1628  BP: 138/72  Pulse: 55   the general appearance reveals a well-developed well-nourished gentleman in no distress.The head and neck exam reveals pupils equal and reactive.  Extraocular movements are full.  There is no scleral icterus.  The mouth and pharynx are normal.  The neck is supple.  The carotids reveal no bruits.  The jugular venous pressure is normal.  The  thyroid is not enlarged.  There is no lymphadenopathy.  The chest is clear to percussion and auscultation.  There are no rales or rhonchi.  Expansion of the chest is symmetrical.  The precordium is quiet.  The first heart sound is normal.  The second heart sound is physiologically split.  There is no  gallop rub or click.  There is a soft systolic ejection murmur at the base.  There is no abnormal lift or heave.  The abdomen is soft and nontender.  The bowel sounds are normal.  The liver and spleen are not enlarged.  There are no abdominal masses.  There are no abdominal bruits.  Extremities reveal good pedal pulses.  There is no phlebitis or edema.  There is no cyanosis or clubbing.  Strength is normal and symmetrical in all extremities.  There is no lateralizing weakness.  There are no sensory deficits.  The skin is warm and dry.  There is no rash.   EKG today shows sinus bradycardia with first degree AV block and right bundle branch block with left axis deviation.  Since last tracing 10/12/11, no significant change.  Assessment / Plan: Continue same medication.  Recheck in 4 months for followup office visit lipid panel hepatic function panel and basal metabolic panel. Today the patient has spent all day at the hospital because his son-in-law age 86 was having aortic valve surgery with a descending aortic arch replacement and CABG.

## 2012-11-19 NOTE — Assessment & Plan Note (Signed)
The patient has had no recurrence of angina pectoris.  He exercises on a regular basis and his weight is down 6 pounds since last visit

## 2012-11-20 ENCOUNTER — Other Ambulatory Visit: Payer: Self-pay | Admitting: *Deleted

## 2012-11-20 MED ORDER — GABAPENTIN 800 MG PO TABS: 800.0000 mg | ORAL_TABLET | Freq: Three times a day (TID) | ORAL | Status: DC

## 2012-11-22 ENCOUNTER — Telehealth: Payer: Self-pay | Admitting: Cardiology

## 2012-11-22 DIAGNOSIS — G4700 Insomnia, unspecified: Secondary | ICD-10-CM

## 2012-11-22 NOTE — Telephone Encounter (Signed)
New Prob     Calling in regarding medicare prescription card numbers. Would like to speak to nurse.

## 2012-11-23 ENCOUNTER — Other Ambulatory Visit: Payer: Self-pay | Admitting: *Deleted

## 2012-11-23 DIAGNOSIS — G4700 Insomnia, unspecified: Secondary | ICD-10-CM

## 2012-11-23 NOTE — Telephone Encounter (Signed)
Requested PA for Zolpidem, went to review

## 2012-11-28 MED ORDER — ZOLPIDEM TARTRATE 10 MG PO TABS: 10.0000 mg | ORAL_TABLET | Freq: Every day | ORAL | Status: DC

## 2012-11-28 NOTE — Telephone Encounter (Signed)
Medication approved

## 2012-11-28 NOTE — Telephone Encounter (Signed)
Follow up    Rosann Auerbach has final approve for the medication.

## 2012-12-07 DIAGNOSIS — R142 Eructation: Secondary | ICD-10-CM | POA: Diagnosis not present

## 2012-12-07 DIAGNOSIS — R141 Gas pain: Secondary | ICD-10-CM | POA: Diagnosis not present

## 2012-12-18 DIAGNOSIS — Z85828 Personal history of other malignant neoplasm of skin: Secondary | ICD-10-CM | POA: Diagnosis not present

## 2012-12-18 DIAGNOSIS — L821 Other seborrheic keratosis: Secondary | ICD-10-CM | POA: Diagnosis not present

## 2012-12-18 DIAGNOSIS — L570 Actinic keratosis: Secondary | ICD-10-CM | POA: Diagnosis not present

## 2013-01-11 DIAGNOSIS — Z961 Presence of intraocular lens: Secondary | ICD-10-CM | POA: Diagnosis not present

## 2013-01-11 DIAGNOSIS — H52209 Unspecified astigmatism, unspecified eye: Secondary | ICD-10-CM | POA: Diagnosis not present

## 2013-02-12 ENCOUNTER — Other Ambulatory Visit: Payer: Self-pay | Admitting: *Deleted

## 2013-02-12 MED ORDER — AMLODIPINE BESYLATE 5 MG PO TABS: 5.0000 mg | ORAL_TABLET | Freq: Every day | ORAL | Status: DC

## 2013-03-06 ENCOUNTER — Other Ambulatory Visit: Payer: Self-pay

## 2013-03-19 ENCOUNTER — Other Ambulatory Visit (INDEPENDENT_AMBULATORY_CARE_PROVIDER_SITE_OTHER): Payer: Medicare Other

## 2013-03-19 DIAGNOSIS — I119 Hypertensive heart disease without heart failure: Secondary | ICD-10-CM | POA: Diagnosis not present

## 2013-03-19 DIAGNOSIS — E7800 Pure hypercholesterolemia, unspecified: Secondary | ICD-10-CM

## 2013-03-19 LAB — BASIC METABOLIC PANEL
CO2: 27 mEq/L (ref 19–32)
Chloride: 106 mEq/L (ref 96–112)
Glucose, Bld: 99 mg/dL (ref 70–99)
Potassium: 3.9 mEq/L (ref 3.5–5.1)
Sodium: 136 mEq/L (ref 135–145)

## 2013-03-19 LAB — HEPATIC FUNCTION PANEL
AST: 20 U/L (ref 0–37)
Albumin: 3.9 g/dL (ref 3.5–5.2)
Total Bilirubin: 1 mg/dL (ref 0.3–1.2)

## 2013-03-19 LAB — LIPID PANEL
HDL: 46 mg/dL (ref >39.00)
LDL Cholesterol: 95 mg/dL (ref 0–99)
Total CHOL/HDL Ratio: 4
Triglycerides: 200 mg/dL — ABNORMAL HIGH (ref 0.0–149.0)

## 2013-03-20 NOTE — Progress Notes (Signed)
Quick Note:  Please make copy of labs for patient visit. ______ 

## 2013-03-22 ENCOUNTER — Encounter: Payer: Self-pay | Admitting: Cardiology

## 2013-03-22 ENCOUNTER — Ambulatory Visit (INDEPENDENT_AMBULATORY_CARE_PROVIDER_SITE_OTHER): Payer: Medicare Other | Admitting: Cardiology

## 2013-03-22 VITALS — BP 124/76 | HR 70 | Ht 73.0 in | Wt 194.1 lb

## 2013-03-22 DIAGNOSIS — I2510 Atherosclerotic heart disease of native coronary artery without angina pectoris: Secondary | ICD-10-CM

## 2013-03-22 DIAGNOSIS — G629 Polyneuropathy, unspecified: Secondary | ICD-10-CM

## 2013-03-22 DIAGNOSIS — I359 Nonrheumatic aortic valve disorder, unspecified: Secondary | ICD-10-CM

## 2013-03-22 DIAGNOSIS — I10 Essential (primary) hypertension: Secondary | ICD-10-CM | POA: Diagnosis not present

## 2013-03-22 DIAGNOSIS — G589 Mononeuropathy, unspecified: Secondary | ICD-10-CM

## 2013-03-22 DIAGNOSIS — E7800 Pure hypercholesterolemia, unspecified: Secondary | ICD-10-CM

## 2013-03-22 DIAGNOSIS — I452 Bifascicular block: Secondary | ICD-10-CM

## 2013-03-22 MED ORDER — NITROGLYCERIN 0.4 MG SL SUBL: 0.4000 mg | SUBLINGUAL_TABLET | SUBLINGUAL | Status: DC | PRN

## 2013-03-22 NOTE — Progress Notes (Signed)
Gabriel [name] Date of Birth:  27-Aug-1934 [facility] [address] [place], [place]  [address] [phone]         Fax   [phone]  History of Present Illness: This pleasant 78 year old gentleman is seen for a scheduled followup office visit. The patient has a history of ischemic heart disease. He had a myocardial infarction in 2003 treated with PCI to his LAD. He also had a stent to his circumflex in 2003. 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 He has a history of hypercholesterolemia and essential hypertension. Since last visit he has been very well with no new cardiac symptoms. Patient also has a past history of kidney stones treated with lithotripsy. He tries to drink a lot of water to keep the kidney stones from reforming. He has a history of erectile dysfunction responding to Viagra. He has been having a lot of GI issues concerning abdominal and gastric bloating and is seen his gastroenterologist Dr. Matthias Hughs. He recently had a CT scan of his abdomen which did not reveal any new findings.   Current Outpatient Prescriptions  Medication Sig Dispense Refill  . amLODipine (NORVASC) 5 MG tablet Take 1 tablet (5 mg total) by mouth daily.  90 tablet  1  . aspirin 81 MG tablet Take 81 mg by mouth daily.      . clopidogrel (PLAVIX) 75 MG tablet Take 1 tablet (75 mg total) by mouth daily.  90 tablet  3  . Coenzyme Q10 (CO Q 10) 100 MG CAPS Take by mouth.      . gabapentin (NEURONTIN) 800 MG tablet Take 1 tablet (800 mg total) by mouth 3 (three) times daily.  90 tablet  5  . lisinopril (PRINIVIL,ZESTRIL) 20 MG tablet TAKE 1 TABLET BY MOUTH EVERY DAY  90 tablet  4  . loratadine (CLARITIN) 10 MG tablet Take 10 mg by mouth daily as needed. For allergies.      . metoprolol succinate (TOPROL-XL) 25 MG 24 hr tablet TAKE 1 TABLET BY MOUTH DAILY  90 tablet  4  . Multiple Vitamins-Minerals (MULTIVITAMINS  THER. W/MINERALS) TABS Take 1 tablet by mouth daily.      . nitroGLYCERIN (NITROSTAT) 0.4 MG SL tablet Place 1 tablet (0.4 mg total) under the tongue every 5 (five) minutes as needed. For chest pain.  30 tablet  3  . sildenafil (VIAGRA) 100 MG tablet Take 1 tablet (100 mg total) by mouth as needed.  10 tablet  3  . zolpidem (AMBIEN) 10 MG tablet Take 1 tablet (10 mg total) by mouth at bedtime.  30 tablet  5   No current facility-administered medications for this visit.    Allergies  Allergen Reactions  . Lipitor [Atorvastatin Calcium] Other (See Comments)    Muscle pain  . Mevacor [Lovastatin] Other (See Comments)    "makes me feel loopy"    Patient Active Problem List   Diagnosis Date Noted  . Hypercholesterolemia 10/22/2010    Priority: High  . Aortic valve disease     Priority: Medium  . Aortic insufficiency     Priority: Medium  . Sleep apnea 03/20/2012  . 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 Neuropathy 02/21/2011  . Coronary artery disease   . Aortic valve sclerosis   . Hypertension     History  Smoking status  . Never Smoker   Smokeless tobacco  .  Never Used    History  Alcohol Use No    No family history on file.  Review of Systems: Constitutional: no fever chills diaphoresis or fatigue or change in weight.  Head and neck: no hearing loss, no epistaxis, no photophobia or visual disturbance. Respiratory: No cough, shortness of breath or wheezing. Cardiovascular: No chest pain peripheral edema, palpitations. Gastrointestinal: No abdominal distention, no abdominal pain, no change in bowel habits hematochezia or melena. Genitourinary: No dysuria, no frequency, no urgency, no nocturia. Musculoskeletal:No arthralgias, no back pain, no gait disturbance or myalgias. Neurological: No dizziness, no headaches, no numbness, no seizures, no syncope, no weakness, no tremors. Hematologic: No lymphadenopathy, no easy  bruising. Psychiatric: No confusion, no hallucinations, no sleep disturbance.    Physical Exam: Filed Vitals:   03/22/13 1026  BP: 124/76  Pulse: 70   the general appearance reveals a well-developed well-nourished gentleman in no distress.The head and neck exam reveals pupils equal and reactive.  Extraocular movements are full.  There is no scleral icterus.  The mouth and pharynx are normal.  The neck is supple.  The carotids reveal no bruits.  The jugular venous pressure is normal.  The  thyroid is not enlarged.  There is no lymphadenopathy.  The chest is clear to percussion and auscultation.  There are no rales or rhonchi.  Expansion of the chest is symmetrical.  The precordium is quiet.  The first heart sound is normal.  The second heart sound is physiologically split.  There is no murmur gallop rub or click.  There is no abnormal lift or heave.  The abdomen is soft and nontender.  The bowel sounds are normal.  The liver and spleen are not enlarged.  There are no abdominal masses.  There are no abdominal bruits.  Extremities reveal good pedal pulses.  There is no phlebitis or edema.  There is no cyanosis or clubbing.  Strength is normal and symmetrical in all extremities.  There is no lateralizing weakness.  There are no sensory deficits.  The skin is warm and dry.  There is no rash.     Assessment / Plan: Because of his myalgias we will stop his Crestor all together at this point.  Consider adding ezetimibe after next visit if his lipids have increased significantly.  His wife is on ezetimibe.  Recheck in 4 months for office visit lipid panel hepatic function panel and basal metabolic panel.

## 2013-03-22 NOTE — Assessment & Plan Note (Signed)
Blood pressure has been remaining stable.  Patient continues to get regular exercise.  Since we last saw him they have sold their house and have moved into a townhouse.  He no longer has to mow the grass

## 2013-03-22 NOTE — Patient Instructions (Addendum)
Your physician has recommended you make the following change in your medication:   1. Stop Crestor  Your physician recommends that you return for lab work in: 4 months, LP, HFP, BMET  Your physician wants you to follow-up in: 4 months with Dr. Rodney Langton will receive a reminder letter in the mail two months in advance. If you don't receive a letter, please call our office to schedule the follow-up appointment.

## 2013-03-22 NOTE — Assessment & Plan Note (Signed)
The patient has a history of hypercholesterolemia.  Recently he has been having a lot of myalgias and stiffness which he attributes to his Crestor.  He had previous similar problems with Lipitor.  We will stop his Crestor at this time and observe off medication until next visit.  He has been able to lose 10 pounds since last visit which will help his lipid status.

## 2013-03-22 NOTE — Assessment & Plan Note (Signed)
The patient has had long-standing neuropathy of his feet.  His blood sugars have been only marginally elevated at times.  He has also had some tingling of the fingertips.  He has been on gabapentin which has helped.

## 2013-04-09 ENCOUNTER — Other Ambulatory Visit: Payer: Self-pay | Admitting: *Deleted

## 2013-04-09 DIAGNOSIS — G4700 Insomnia, unspecified: Secondary | ICD-10-CM

## 2013-04-09 MED ORDER — ZOLPIDEM TARTRATE 10 MG PO TABS: 10.0000 mg | ORAL_TABLET | Freq: Every day | ORAL | Status: DC

## 2013-04-12 ENCOUNTER — Telehealth: Payer: Self-pay | Admitting: Cardiology

## 2013-04-12 NOTE — Telephone Encounter (Signed)
Left message to call back  Need number to call for PA

## 2013-04-12 NOTE — Telephone Encounter (Signed)
New message:  Pt needs authorization for Ambien for the insurance company.  Information is in chart.  Please call pat and advise when this is taken care of.  Pharmacy stated this had already been faxed.  [facility] [address] [place].

## 2013-04-15 ENCOUNTER — Other Ambulatory Visit: Payer: Self-pay | Admitting: Cardiology

## 2013-04-15 NOTE — Telephone Encounter (Signed)
[employer] Medicare Rx approved zolpidem 10 mg as directed from 04/15/2013-04/15/2014

## 2013-04-15 NOTE — Telephone Encounter (Signed)
Gabriel [name] Medicare [id number] for PA Ambien 10 mg   ID #[id number]  Received fax, approved for 6 months. Called patient and pharmacy

## 2013-05-10 ENCOUNTER — Other Ambulatory Visit: Payer: Self-pay | Admitting: Cardiology

## 2013-06-06 ENCOUNTER — Other Ambulatory Visit: Payer: Self-pay

## 2013-06-25 DIAGNOSIS — J342 Deviated nasal septum: Secondary | ICD-10-CM | POA: Diagnosis not present

## 2013-06-25 DIAGNOSIS — H9209 Otalgia, unspecified ear: Secondary | ICD-10-CM | POA: Diagnosis not present

## 2013-06-25 DIAGNOSIS — H6120 Impacted cerumen, unspecified ear: Secondary | ICD-10-CM | POA: Diagnosis not present

## 2013-06-25 DIAGNOSIS — Z9989 Dependence on other enabling machines and devices: Secondary | ICD-10-CM | POA: Diagnosis not present

## 2013-06-25 DIAGNOSIS — J069 Acute upper respiratory infection, unspecified: Secondary | ICD-10-CM | POA: Diagnosis not present

## 2013-07-04 ENCOUNTER — Telehealth: Payer: Self-pay | Admitting: Neurology

## 2013-07-04 NOTE — Telephone Encounter (Signed)
LM for pt to call and r/s 2/12 appt per Dr. Oliva Bustard schedule.

## 2013-07-05 ENCOUNTER — Telehealth: Payer: Self-pay | Admitting: *Deleted

## 2013-07-08 ENCOUNTER — Telehealth: Payer: Self-pay | Admitting: Neurology

## 2013-07-08 NOTE — Telephone Encounter (Signed)
Called patient to schedule sooner appt w/ Dr Vickey Huger, left VM message for return call

## 2013-07-08 NOTE — Telephone Encounter (Signed)
Patient has been scheduled/ confirmed appt. 

## 2013-07-13 ENCOUNTER — Other Ambulatory Visit: Payer: Self-pay | Admitting: Cardiology

## 2013-07-15 ENCOUNTER — Other Ambulatory Visit: Payer: Self-pay | Admitting: Cardiology

## 2013-07-31 ENCOUNTER — Other Ambulatory Visit: Payer: Self-pay | Admitting: Cardiology

## 2013-08-02 ENCOUNTER — Encounter (HOSPITAL_COMMUNITY): Payer: Self-pay | Admitting: Emergency Medicine

## 2013-08-02 ENCOUNTER — Emergency Department (INDEPENDENT_AMBULATORY_CARE_PROVIDER_SITE_OTHER)
Admission: EM | Admit: 2013-08-02 | Discharge: 2013-08-02 | Disposition: A | Discharge status: Home / Self Care | Payer: Medicare Other | Source: Home / Self Care | Attending: Family Medicine | Admitting: Family Medicine

## 2013-08-02 DIAGNOSIS — B9689 Other specified bacterial agents as the cause of diseases classified elsewhere: Secondary | ICD-10-CM

## 2013-08-02 DIAGNOSIS — A499 Bacterial infection, unspecified: Secondary | ICD-10-CM

## 2013-08-02 DIAGNOSIS — J329 Chronic sinusitis, unspecified: Secondary | ICD-10-CM | POA: Diagnosis not present

## 2013-08-02 MED ORDER — MINOCYCLINE HCL 100 MG PO CAPS: 100.0000 mg | ORAL_CAPSULE | Freq: Two times a day (BID) | ORAL | Status: DC

## 2013-08-02 NOTE — ED Provider Notes (Signed)
CSN: [id number]     Arrival date & time 08/02/13  1300 History   First MD Initiated Contact with Patient 08/02/13 1459     Chief Complaint  Patient presents with  . URI   (Consider location/radiation/quality/duration/timing/severity/associated sxs/prior Treatment) Patient is a 79 y.o. male presenting with URI. The history is provided by the patient.  URI Presenting symptoms: congestion, cough and rhinorrhea   Presenting symptoms: no fever and no sore throat   Severity:  Mild Onset quality:  Gradual Duration:  1 month Progression:  Worsening Chronicity:  New Ineffective treatments:  Prescription medications Associated symptoms: sinus pain   Risk factors: being elderly and sick contacts     Past Medical History  Diagnosis Date  . Ischemic heart disease     Remote MI with PCI to LAD in 2003; s/p Stent to LCX in 2003  . Aortic valve disease   . Aortic valve sclerosis   . Aortic insufficiency   . Hypertension   . Neuropathy   . Hyperlipidemia   . Acute cholecystitis   . Coronary artery disease   . Dyslipidemia   . Acute myocardial infarction   . Kidney stone \   Past Surgical History  Procedure Laterality Date  . Cardiac catheterization  2003    Following MI  . Coronary angioplasty  2003    LAD  . Coronary stent placement  2003    LCX  . Cholecystectomy     History reviewed. No pertinent family history. History  Substance Use Topics  . Smoking status: Never Smoker   . Smokeless tobacco: Never Used  . Alcohol Use: No    Review of Systems  Constitutional: Negative.  Negative for fever.  HENT: Positive for congestion, postnasal drip, rhinorrhea and sinus pressure. Negative for sore throat.   Respiratory: Positive for cough.   Cardiovascular: Negative.   Gastrointestinal: Negative.     Allergies  Lipitor and Mevacor  Home Medications   Current Outpatient Rx  Name  Route  Sig  Dispense  Refill  . amLODipine (NORVASC) 5 MG tablet      TAKE 1 TABLET (5 MG  TOTAL) BY MOUTH DAILY.   90 tablet   0     PATIENT NEEDS TO CALL OUR OFFICE TO SCHEDULE A FOU ...   . aspirin 81 MG tablet   Oral   Take 81 mg by mouth daily.         . clopidogrel (PLAVIX) 75 MG tablet      TAKE 1 TABLET (75 MG TOTAL) BY MOUTH DAILY.   90 tablet   0     .Marland KitchenPatient needs to contact office to schedule  App ...   . Coenzyme Q10 (CO Q 10) 100 MG CAPS   Oral   Take by mouth.         . gabapentin (NEURONTIN) 800 MG tablet   Oral   Take 1 tablet (800 mg total) by mouth 3 (three) times daily.   90 tablet   5   . lisinopril (PRINIVIL,ZESTRIL) 20 MG tablet      TAKE 1 TABLET BY MOUTH EVERY DAY   90 tablet   1   . loratadine (CLARITIN) 10 MG tablet   Oral   Take 10 mg by mouth daily as needed. For allergies.         . metoprolol succinate (TOPROL-XL) 25 MG 24 hr tablet      TAKE 1 TABLET BY MOUTH DAILY   90 tablet  4   . minocycline (MINOCIN,DYNACIN) 100 MG capsule   Oral   Take 1 capsule (100 mg total) by mouth 2 (two) times daily.   20 capsule   0   . Multiple Vitamins-Minerals (MULTIVITAMINS THER. W/MINERALS) TABS   Oral   Take 1 tablet by mouth daily.         . nitroGLYCERIN (NITROSTAT) 0.4 MG SL tablet   Sublingual   Place 1 tablet (0.4 mg total) under the tongue every 5 (five) minutes as needed. For chest pain.   30 tablet   3   . sildenafil (VIAGRA) 100 MG tablet   Oral   Take 1 tablet (100 mg total) by mouth as needed.   10 tablet   3   . zolpidem (AMBIEN) 10 MG tablet   Oral   Take 1 tablet (10 mg total) by mouth at bedtime.   30 tablet   5    BP 161/71  Pulse 78  Temp(Src) 99.9 F (37.7 C) (Oral)  Resp 16  SpO2 99% Physical Exam  Nursing note and vitals reviewed. Constitutional: He is oriented to person, place, and time. He appears well-developed and well-nourished.  HENT:  Head: Normocephalic.  Right Ear: External ear normal.  Left Ear: External ear normal.  Nose: Mucosal edema and rhinorrhea present.  Right sinus exhibits no maxillary sinus tenderness. Left sinus exhibits no maxillary sinus tenderness.  Mouth/Throat: Oropharynx is clear and moist.  Neck: Normal range of motion. Neck supple.  Cardiovascular: Regular rhythm.   Pulmonary/Chest: Breath sounds normal.  Lymphadenopathy:    He has no cervical adenopathy.  Neurological: He is alert and oriented to person, place, and time.  Skin: Skin is warm and dry.    ED Course  Procedures (including critical care time) Labs Review Labs Reviewed - No data to display Imaging Review No results found.  EKG Interpretation    Date/Time:    Ventricular Rate:    PR Interval:    QRS Duration:   QT Interval:    QTC Calculation:   R Axis:     Text Interpretation:              MDM      Billy Fischer, MD 08/02/13 1520

## 2013-08-02 NOTE — ED Notes (Signed)
Pt     Reports  Symptoms  Of   Sinus        Drainage            /  Cough   Congested  For  Over  One  Month   -  He  States    He  Was  Treated  By   ent  Dr  Who  rx  flonase  Inhaler  Which  Did  Not  Clear up the  Symptoms       He  Appears  In no   Acute  Distress           Speaking in  Complete  sentances

## 2013-08-02 NOTE — Discharge Instructions (Signed)
Drink plenty of fluids, use saline spray and humidifier as discussed, take all of antibiotic, see your doctor if further problems.

## 2013-08-11 ENCOUNTER — Other Ambulatory Visit: Payer: Self-pay | Admitting: Cardiology

## 2013-08-11 DIAGNOSIS — G629 Polyneuropathy, unspecified: Secondary | ICD-10-CM

## 2013-09-04 ENCOUNTER — Encounter: Payer: Self-pay | Admitting: Neurology

## 2013-09-12 ENCOUNTER — Ambulatory Visit: Payer: Self-pay | Admitting: Neurology

## 2013-09-16 ENCOUNTER — Other Ambulatory Visit (INDEPENDENT_AMBULATORY_CARE_PROVIDER_SITE_OTHER): Payer: Medicare Other

## 2013-09-16 DIAGNOSIS — I359 Nonrheumatic aortic valve disorder, unspecified: Secondary | ICD-10-CM

## 2013-09-16 DIAGNOSIS — E7800 Pure hypercholesterolemia, unspecified: Secondary | ICD-10-CM | POA: Diagnosis not present

## 2013-09-16 DIAGNOSIS — R109 Unspecified abdominal pain: Secondary | ICD-10-CM | POA: Diagnosis not present

## 2013-09-16 DIAGNOSIS — I2510 Atherosclerotic heart disease of native coronary artery without angina pectoris: Secondary | ICD-10-CM

## 2013-09-16 DIAGNOSIS — I10 Essential (primary) hypertension: Secondary | ICD-10-CM

## 2013-09-16 DIAGNOSIS — N200 Calculus of kidney: Secondary | ICD-10-CM | POA: Diagnosis not present

## 2013-09-16 DIAGNOSIS — I452 Bifascicular block: Secondary | ICD-10-CM

## 2013-09-16 LAB — BASIC METABOLIC PANEL
BUN: 18 mg/dL (ref 6–23)
CHLORIDE: 102 meq/L (ref 96–112)
CO2: 27 mEq/L (ref 19–32)
CREATININE: 1.3 mg/dL (ref 0.4–1.5)
Calcium: 8.9 mg/dL (ref 8.4–10.5)
GFR: 59.51 mL/min — AB (ref >60.00)
Glucose, Bld: 100 mg/dL — ABNORMAL HIGH (ref 70–99)
POTASSIUM: 4 meq/L (ref 3.5–5.1)
Sodium: 137 mEq/L (ref 135–145)

## 2013-09-16 LAB — LDL CHOLESTEROL, DIRECT: LDL DIRECT: 145.5 mg/dL

## 2013-09-16 LAB — HEPATIC FUNCTION PANEL
ALBUMIN: 3.8 g/dL (ref 3.5–5.2)
ALT: 17 U/L (ref 0–53)
AST: 22 U/L (ref 0–37)
Alkaline Phosphatase: 39 U/L (ref 39–117)
BILIRUBIN DIRECT: 0 mg/dL (ref 0.0–0.3)
TOTAL PROTEIN: 6.9 g/dL (ref 6.0–8.3)
Total Bilirubin: 0.9 mg/dL (ref 0.3–1.2)

## 2013-09-16 LAB — LIPID PANEL
CHOLESTEROL: 233 mg/dL — AB (ref 0–200)
HDL: 39.7 mg/dL (ref >39.00)
Total CHOL/HDL Ratio: 6
Triglycerides: 297 mg/dL — ABNORMAL HIGH (ref 0.0–149.0)
VLDL: 59.4 mg/dL — AB (ref 0.0–40.0)

## 2013-09-17 NOTE — Progress Notes (Signed)
Quick Note:  Please make copy of labs for patient visit. ______ 

## 2013-09-18 ENCOUNTER — Other Ambulatory Visit: Payer: Self-pay | Admitting: Cardiology

## 2013-09-18 ENCOUNTER — Encounter: Payer: Self-pay | Admitting: Neurology

## 2013-09-18 ENCOUNTER — Ambulatory Visit (INDEPENDENT_AMBULATORY_CARE_PROVIDER_SITE_OTHER): Payer: Medicare Other | Admitting: Neurology

## 2013-09-18 VITALS — BP 126/78 | HR 76 | Resp 18 | Ht 72.75 in | Wt 210.0 lb

## 2013-09-18 DIAGNOSIS — I2510 Atherosclerotic heart disease of native coronary artery without angina pectoris: Secondary | ICD-10-CM | POA: Diagnosis not present

## 2013-09-18 DIAGNOSIS — G629 Polyneuropathy, unspecified: Secondary | ICD-10-CM

## 2013-09-18 DIAGNOSIS — G589 Mononeuropathy, unspecified: Secondary | ICD-10-CM

## 2013-09-18 DIAGNOSIS — Z9989 Dependence on other enabling machines and devices: Principal | ICD-10-CM

## 2013-09-18 DIAGNOSIS — G4733 Obstructive sleep apnea (adult) (pediatric): Secondary | ICD-10-CM | POA: Diagnosis not present

## 2013-09-18 MED ORDER — GABAPENTIN 800 MG PO TABS: 800.0000 mg | ORAL_TABLET | Freq: Three times a day (TID) | ORAL | Status: DC

## 2013-09-18 NOTE — Patient Instructions (Signed)

## 2013-09-18 NOTE — Progress Notes (Signed)
[facility]  Provider:  Larey Seat, M D  Referring Provider: Darlin Coco, MD Primary Care Physician:  Darlin Coco, MD    OSA on CPAP :   HPI:  Gabriel [name] is a 79 y.o. male  Is seen here as a  revisit  from Dr. Mare [name] , his cardiologist , for a CPAP follow up.  [name] has been followed in this clinic since 2012 he underwent a sleep study, ordered by Dr. Mare [name], on 04-19-11.  At that time he has a past medical history of coronary artery disease, optic valve disease, peripheral neuropathy and hypertension.    He had mostly complained of restless legs and witnessed apneas. The patient was diagnosed with an AHI of 24.9 and an RDI of 28.5. There was a clear accentuation of apnea during REM sleep - the REM AHI being 48.9,  supine AHI was also elevated at 40 over the nonsupine AHI of of 14. Nadir of  oxygen saturation was 77% and 59 minutes of desaturations total time.  The patient returned for a titration study and was titrated to 6 cm water pressure and 2 centimeter EPR. He had frequent sinusitis before CPAP use, and not since. He is not longer snoring. He may have one nocturia if any at all. Overall sleep duration is 7 hours at night , occasional naps in the afternoon.   Today's download shows the patient using the machine 6 hours and 29 minutes nightly, with a residual AHI of 5, a moderate air leak. The patient uses a nasal pillow with heated humidity. He has a 93% compliance for CPAP use above 4 hours nightly user time. Out of 90 days the machine was used 88.  His restless legs have been asymptomatic - he had PLMs , no longer RLS.     Review of Systems: Out of a complete 14 system review, the patient complains of only the following symptoms, and all other reviewed systems are negative. The patient endorsed the geriatric depression score at 0 points, the restless leg score at mild to moderate degree of impairment. FSS at 28 and Epworth at 6 points.    History   Social History  . Marital Status: Married    Spouse Name: Gabriel [name]    Number of Children: 6  . Years of Education: hs   Occupational History  .     Social History Main Topics  . Smoking status: Former Research scientist (life sciences)  . Smokeless tobacco: Never Used     Comment: 1968  . Alcohol Use: 0.0 oz/week     Comment: rarely  . Drug Use: No  . Sexual Activity: Yes   Other Topics Concern  . Not on file   Social History Narrative   Patient is married Gabriel [name]).   Patient has four biological children and two adopted children.   Patient is retired from Electronics engineer.   Patient has a high school education.   Patient drinks coffee (12oz) daily.   Patient is right-handed.                Family History  Problem Relation Age of Onset  . Heart Problems Mother   . Parkinson's disease Father 21  . Other Sister     Muscular degeneration    Past Medical History  Diagnosis Date  . Ischemic heart disease     Remote MI with PCI to LAD in 2003; s/p Stent to LCX in 2003  . Aortic valve disease   . Aortic  valve sclerosis   . Aortic insufficiency   . Hypertension   . Neuropathy   . Hyperlipidemia   . Acute cholecystitis   . Coronary artery disease   . Dyslipidemia   . Acute myocardial infarction   . Kidney stone \  . OSA (obstructive sleep apnea)     with upper airway resistancy  . Insomnia   . Vertigo     Had Eppley maneuver, vestibular rehab.    Past Surgical History  Procedure Laterality Date  . Cardiac catheterization  2003    Following MI  . Coronary angioplasty  2003    LAD  . Coronary stent placement  2003    LCX  . Cholecystectomy  2009  . Meniscus removal- knee Right   . Shoulder open rotator cuff repair  1990  . Hernia repair  1994    double  . Cataract extraction, bilateral  2004  . Kidney stone surgery  10/2005  . Lithotripsy  10/2005    Current Outpatient Prescriptions  Medication Sig Dispense Refill  . amLODipine (NORVASC) 5 MG  tablet TAKE 1 TABLET (5 MG TOTAL) BY MOUTH DAILY.  90 tablet  0  . aspirin 81 MG tablet Take 81 mg by mouth daily.      . clopidogrel (PLAVIX) 75 MG tablet TAKE 1 TABLET (75 MG TOTAL) BY MOUTH DAILY.  90 tablet  0  . gabapentin (NEURONTIN) 800 MG tablet TAKE 1 TABLET BY MOUTH 3 TIMES DAILY  90 tablet  5  . lisinopril (PRINIVIL,ZESTRIL) 20 MG tablet TAKE 1 TABLET BY MOUTH EVERY DAY  90 tablet  1  . loratadine (CLARITIN) 10 MG tablet Take 10 mg by mouth daily as needed. For allergies.      . metoprolol succinate (TOPROL-XL) 25 MG 24 hr tablet TAKE 1 TABLET BY MOUTH DAILY  90 tablet  4  . Multiple Vitamins-Minerals (MULTIVITAMINS THER. W/MINERALS) TABS Take 1 tablet by mouth daily.      . nitroGLYCERIN (NITROSTAT) 0.4 MG SL tablet Place 1 tablet (0.4 mg total) under the tongue every 5 (five) minutes as needed. For chest pain.  30 tablet  3  . sildenafil (VIAGRA) 100 MG tablet Take 1 tablet (100 mg total) by mouth as needed.  10 tablet  3  . zolpidem (AMBIEN) 10 MG tablet Take 1 tablet (10 mg total) by mouth at bedtime.  30 tablet  5   No current facility-administered medications for this visit.    Allergies as of 09/18/2013 - Review Complete 09/18/2013  Allergen Reaction Noted  . Lipitor [atorvastatin calcium] Other (See Comments) 08/30/2010  . Mevacor [lovastatin] Other (See Comments) 08/30/2010    Vitals: BP 126/78  Pulse 76  Resp 18  Ht 6' 0.75" (1.848 m)  Wt 210 lb (95.255 kg)  BMI 27.89 kg/m2 Last Weight:  Wt Readings from Last 1 Encounters:  09/18/13 210 lb (95.255 kg)   Last Height:   Ht Readings from Last 1 Encounters:  09/18/13 6' 0.75" (1.848 m)   BMI  .  Physical exam:  General: The patient is awake, alert and appears not in acute distress. The patient is well groomed. Head: Normocephalic, atraumatic. Neck is supple. Mallampati 3 , neck circumference:15 Cardiovascular:  Regular rate and rhythm, without  murmurs or carotid bruit, and without distended neck  veins. Respiratory: Lungs are clear to auscultation. Skin:  Without evidence of edema, or rash Trunk: BMI is elevated , the  patient has normal posture, no scoliosis .  Neurologic exam :  The patient is awake and alert, oriented to place and time.  Memory subjective  described as intact.  There is a normal attention span & concentration ability. Speech is fluent without  dysarthria, dysphonia or aphasia. Mood and affect are appropriate.  Cranial nerves: Pupils are equal and briskly reactive to light. Funduscopic exam without evidence of pallor or edema. Extraocular movements  in vertical and horizontal planes intact and without nystagmus. Visual fields by finger perimetry are intact. Hearing to finger rub intact.  Facial sensation intact to fine touch. Facial motor strength is symmetric and tongue and uvula move midline.  Motor exam:   Normal tone and symmetric normal strength in all extremities. He has overcome Myalgia , attributed to statin use. Tried crestor.   Sensory:  Fine touch, pinprick and vibration were tested in all extremities.  In his fingertips, his sensation has been decreased to fine touch and vibration, but not to temperature or pain. Marland Kitchen Proprioception is tested in the upper extremities only. This was normal. Very dry finger tips, skin is "parched" . Decreased toe vibration and ankle vibration.   Coordination: Rapid alternating movements in the fingers/hands is tested and normal.  Finger-to-nose maneuver tested and normal without evidence of ataxia, dysmetria or tremor.  Gait and station: Patient walks without assistive device and is able and assisted stool climb up to the exam table. Strength within normal limits. Stance is stable and normal. Steps are unfragmented.  Deep tendon reflexes: in the  upper and lower extremities are symmetric and intact. Babinski maneuver response is downgoing.   Assessment:  After physical and neurologic examination, review of laboratory studies,  imaging, neurophysiology testing and pre-existing records, assessment is   1) Burning dysesthesias as the day progresses , mostly in feet. Numb fingertips has affected his ability to turn pages-  neuropathy . Small fiber.  No associated dysautonomia? He denies constipation, has orthostatic lightheadedness,  He no longer sweats , even in summertime.   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   2) OSA - treated well on CPAP, 10 cm water , compliance is high , Yearly RV.   Plan:  Treatment plan and additional workup :continue CPAP and Neurontin.

## 2013-09-19 ENCOUNTER — Ambulatory Visit (INDEPENDENT_AMBULATORY_CARE_PROVIDER_SITE_OTHER): Payer: Medicare Other | Admitting: Cardiology

## 2013-09-19 ENCOUNTER — Encounter: Payer: Self-pay | Admitting: Cardiology

## 2013-09-19 VITALS — BP 129/76 | HR 66 | Ht 72.0 in | Wt 206.0 lb

## 2013-09-19 DIAGNOSIS — I358 Other nonrheumatic aortic valve disorders: Secondary | ICD-10-CM

## 2013-09-19 DIAGNOSIS — I452 Bifascicular block: Secondary | ICD-10-CM | POA: Diagnosis not present

## 2013-09-19 DIAGNOSIS — E7800 Pure hypercholesterolemia, unspecified: Secondary | ICD-10-CM

## 2013-09-19 DIAGNOSIS — I10 Essential (primary) hypertension: Secondary | ICD-10-CM | POA: Diagnosis not present

## 2013-09-19 DIAGNOSIS — G4730 Sleep apnea, unspecified: Secondary | ICD-10-CM

## 2013-09-19 DIAGNOSIS — I359 Nonrheumatic aortic valve disorder, unspecified: Secondary | ICD-10-CM

## 2013-09-19 DIAGNOSIS — I2510 Atherosclerotic heart disease of native coronary artery without angina pectoris: Secondary | ICD-10-CM | POA: Diagnosis not present

## 2013-09-19 MED ORDER — EZETIMIBE 10 MG PO TABS: 10.0000 mg | ORAL_TABLET | Freq: Every day | ORAL | Status: DC

## 2013-09-19 NOTE — Assessment & Plan Note (Signed)
Patient is not having any symptoms from his mild aortic valve disease.

## 2013-09-19 NOTE — Assessment & Plan Note (Signed)
The patient has not been experiencing any recurrent chest pain or angina.  He has had some left lateral pleuritic pain from heart coughing and thinks that he may have fractured a rib several weeks ago.

## 2013-09-19 NOTE — Assessment & Plan Note (Signed)
The patient uses a CPAP machine.  Dr. Brett Fairy follows him for his sleep apnea.  He had a recent checkup with her and got a good report.

## 2013-09-19 NOTE — Progress Notes (Signed)
Gabriel [name] Date of Birth:  12-Sep-1934 [address] [place], [place]  [address] [phone]         Fax   [phone]  History of Present Illness: This pleasant 79 year old gentleman is seen for a scheduled followup office visit. The patient has a history of ischemic heart disease. He had a myocardial infarction in 2003 treated with PCI to his LAD. He also had a stent to his circumflex in 2003. 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 He has a history of hypercholesterolemia and essential hypertension. Since last visit he has been very well with no new cardiac symptoms. Patient also has a past history of kidney stones treated with lithotripsy. He tries to drink a lot of water to keep the kidney stones from reforming. He has a history of erectile dysfunction responding to Viagra. He has been having a lot of GI issues concerning abdominal and gastric bloating and is seen his gastroenterologist Dr. Cristina Gong. He recently had a CT scan of his abdomen which did not reveal any new findings. Since last visit he has had a lot of problems with sinusitis and vertigo.  He is seeing Dr. Wilburn Cornelia. Since we last saw him he has been off his statin because of myalgias.  His weight is up 12 pounds since last visit.   Current Outpatient Prescriptions  Medication Sig Dispense Refill  . amLODipine (NORVASC) 5 MG tablet TAKE 1 TABLET (5 MG TOTAL) BY MOUTH DAILY.  90 tablet  0  . amLODipine (NORVASC) 5 MG tablet TAKE 1 TABLET (5 MG TOTAL) BY MOUTH DAILY.  90 tablet  0  . aspirin 81 MG tablet Take 81 mg by mouth daily.      . clopidogrel (PLAVIX) 75 MG tablet TAKE 1 TABLET (75 MG TOTAL) BY MOUTH DAILY.  90 tablet  0  . gabapentin (NEURONTIN) 800 MG tablet Take 1 tablet (800 mg total) by mouth 3 (three) times daily.  270 tablet  5  . lisinopril (PRINIVIL,ZESTRIL) 20 MG tablet TAKE 1 TABLET BY MOUTH EVERY DAY  90 tablet  1    . loratadine (CLARITIN) 10 MG tablet Take 10 mg by mouth daily as needed. For allergies.      . metoprolol succinate (TOPROL-XL) 25 MG 24 hr tablet TAKE 1 TABLET BY MOUTH DAILY  90 tablet  4  . Multiple Vitamins-Minerals (MULTIVITAMINS THER. W/MINERALS) TABS Take 1 tablet by mouth daily.      . nitroGLYCERIN (NITROSTAT) 0.4 MG SL tablet Place 1 tablet (0.4 mg total) under the tongue every 5 (five) minutes as needed. For chest pain.  30 tablet  3  . sildenafil (VIAGRA) 100 MG tablet Take 1 tablet (100 mg total) by mouth as needed.  10 tablet  3  . zolpidem (AMBIEN) 10 MG tablet Take 1 tablet (10 mg total) by mouth at bedtime.  30 tablet  5  . ezetimibe (ZETIA) 10 MG tablet Take 1 tablet (10 mg total) by mouth daily.  90 tablet  3   No current facility-administered medications for this visit.    Allergies  Allergen Reactions  . Lipitor [Atorvastatin Calcium] Other (See Comments)    Muscle pain  . Mevacor [Lovastatin] Other (See Comments)    "makes me feel loopy"    Patient Active Problem List   Diagnosis Date Noted  . Hypercholesterolemia 10/22/2010    Priority: High  . Aortic valve disease  Priority: Medium  . Aortic insufficiency     Priority: Medium  . OSA on CPAP 09/18/2013  . Sleep apnea 03/20/2012  . 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 Neuropathy 02/21/2011  . Coronary artery disease   . Aortic valve sclerosis   . Benign hypertensive heart disease without heart failure     History  Smoking status  . Former Smoker  Smokeless tobacco  . Never Used    Comment: 1968    History  Alcohol Use  . 0.0 oz/week    Comment: rarely    Family History  Problem Relation Age of Onset  . Heart Problems Mother   . Parkinson's disease Father 66  . Other Sister     Muscular degeneration    Review of Systems: Constitutional: no fever chills diaphoresis or fatigue or change in weight.  Head and neck: no hearing loss, no  epistaxis, no photophobia or visual disturbance. Respiratory: No cough, shortness of breath or wheezing. Cardiovascular: No chest pain peripheral edema, palpitations. Gastrointestinal: No abdominal distention, no abdominal pain, no change in bowel habits hematochezia or melena. Genitourinary: No dysuria, no frequency, no urgency, no nocturia. Musculoskeletal:No arthralgias, no back pain, no gait disturbance or myalgias. Neurological: No dizziness, no headaches, no numbness, no seizures, no syncope, no weakness, no tremors. Hematologic: No lymphadenopathy, no easy bruising. Psychiatric: No confusion, no hallucinations, no sleep disturbance.    Physical Exam: Filed Vitals:   09/19/13 1450  BP: 129/76  Pulse: 66   the general appearance reveals a well-developed well-nourished gentleman in no distress.The head and neck exam reveals pupils equal and reactive.  Extraocular movements are full.  There is no scleral icterus.  The mouth and pharynx are normal.  The neck is supple.  The carotids reveal no bruits.  The jugular venous pressure is normal.  The  thyroid is not enlarged.  There is no lymphadenopathy.  The chest is clear to percussion and auscultation.  There are no rales or rhonchi.  Expansion of the chest is symmetrical.  The precordium is quiet.  The first heart sound is normal.  The second heart sound is physiologically split.  There is a soft systolic ejection murmur at the aortic area.  There is no gallop rub or click.  There is no abnormal lift or heave.  The abdomen is soft and nontender.  The bowel sounds are normal.  The liver and spleen are not enlarged.  There are no abdominal masses.  There are no abdominal bruits.  Extremities reveal good pedal pulses.  There is no phlebitis or edema.  There is no cyanosis or clubbing.  Strength is normal and symmetrical in all extremities.  There is no lateralizing weakness.  There are no sensory deficits.  The skin is warm and dry.  There is no  rash.     Assessment / Plan: Continue same medication.  Start ezetimibe 10 mg one daily. Needs to get back on history diet and also back on his exercise program and lose weight. He also understands that he needs to have a primary care provider.  I suggested the [facility] primary care practice. Recheck here in 4 months for followup office visit fasting lipid panel hepatic function panel and basal metabolic panel.

## 2013-09-19 NOTE — Assessment & Plan Note (Signed)
We reviewed his recent labs.  His lipids are not satisfactory since going off Crestor.  Previously he had also developed problems from Lipitor and Mevacor.  We will start him on ezetimibe 10 mg one daily.

## 2013-09-19 NOTE — Patient Instructions (Signed)
START ZETIA 10 MG DAILY   WORK ON WEIGHT LOSS AND DIET   Call [facility] [phone] to get established with a primary care physician (Dr Eliezer Lofts or associate)  Your physician recommends that you schedule a follow-up appointment in: 4 months with fasting labs (lp/bmet/hfp)

## 2013-09-20 ENCOUNTER — Ambulatory Visit: Payer: Medicare Other | Admitting: Cardiology

## 2013-09-23 ENCOUNTER — Encounter: Payer: Self-pay | Admitting: Neurology

## 2013-09-27 DIAGNOSIS — Z9989 Dependence on other enabling machines and devices: Secondary | ICD-10-CM | POA: Diagnosis not present

## 2013-09-27 DIAGNOSIS — J310 Chronic rhinitis: Secondary | ICD-10-CM | POA: Diagnosis not present

## 2013-09-27 DIAGNOSIS — J342 Deviated nasal septum: Secondary | ICD-10-CM | POA: Diagnosis not present

## 2013-09-27 DIAGNOSIS — H8110 Benign paroxysmal vertigo, unspecified ear: Secondary | ICD-10-CM | POA: Diagnosis not present

## 2013-10-16 ENCOUNTER — Telehealth: Payer: Self-pay | Admitting: *Deleted

## 2013-10-16 DIAGNOSIS — N529 Male erectile dysfunction, unspecified: Secondary | ICD-10-CM

## 2013-10-16 MED ORDER — SILDENAFIL CITRATE 20 MG PO TABS: ORAL_TABLET | ORAL | Status: DC

## 2013-10-16 NOTE — Telephone Encounter (Signed)
Spoke with patient and he wanted to get Viagra 20 mg tablets in place of his 100 mg tablets. Patient can get them at [facility] for $80.00 for quantity of 50. Patient usually only takes 1/2 of the 100 mg tablet. Patient thinks taking just 2 of the 20 mg would be sufficient. Will forward to  Dr. Mare Ferrari for review

## 2013-10-16 NOTE — Telephone Encounter (Signed)
Printed and will mail to patient after Dr. Mare Ferrari has signed

## 2013-10-16 NOTE — Telephone Encounter (Signed)
Sounds like a good plan.  Okay to fill.

## 2013-10-22 DIAGNOSIS — H8110 Benign paroxysmal vertigo, unspecified ear: Secondary | ICD-10-CM | POA: Diagnosis not present

## 2013-10-29 ENCOUNTER — Other Ambulatory Visit: Payer: Self-pay | Admitting: Cardiology

## 2013-11-02 ENCOUNTER — Other Ambulatory Visit: Payer: Self-pay | Admitting: Cardiology

## 2013-11-02 DIAGNOSIS — G4700 Insomnia, unspecified: Secondary | ICD-10-CM

## 2013-11-11 ENCOUNTER — Other Ambulatory Visit: Payer: Self-pay | Admitting: Cardiology

## 2013-11-11 ENCOUNTER — Telehealth: Payer: Self-pay | Admitting: *Deleted

## 2013-11-11 NOTE — Telephone Encounter (Signed)
Patient stated that his insurance will only pay for 90 of the ambien per year and that our office needs to call [phone] and his id number is [id number] to get more approved. Thanks, MI

## 2013-11-20 DIAGNOSIS — M1710 Unilateral primary osteoarthritis, unspecified knee: Secondary | ICD-10-CM | POA: Diagnosis not present

## 2013-11-26 ENCOUNTER — Telehealth: Payer: Self-pay | Admitting: *Deleted

## 2013-11-26 NOTE — Telephone Encounter (Signed)
Advised patient according to insurance needs PA not quantity override. Forms waiting to be completed by  Dr. Mare Ferrari. Will call back when hear back from insurance

## 2013-11-26 NOTE — Telephone Encounter (Signed)
Patient called for an update on the ambien for him and his wife. Do you know yet if the insurance is going to allow them to get more that 90 per year? Please advise. Thanks, MI

## 2013-11-27 NOTE — Telephone Encounter (Signed)
Forms faxed

## 2013-12-12 ENCOUNTER — Other Ambulatory Visit: Payer: Self-pay | Admitting: *Deleted

## 2013-12-12 DIAGNOSIS — G4700 Insomnia, unspecified: Secondary | ICD-10-CM

## 2013-12-12 MED ORDER — TEMAZEPAM 15 MG PO CAPS: 15.0000 mg | ORAL_CAPSULE | Freq: Every evening | ORAL | Status: DC | PRN

## 2013-12-12 NOTE — Telephone Encounter (Signed)
Advised [facility]   Gabriel Coco, MD Gabriel [name]            Try generic restoril 15 mg HS         Status: Signed       Forms faxed        Gabriel [name] at 11/26/2013 6:03 PM    Status: Signed        Advised patient according to insurance needs PA not quantity override. Forms waiting to be completed by Dr. Mare Ferrari. Will call back when hear back from insurance         [place] at 11/26/2013 9:05 AM    Status: Signed        Patient called for an update on the ambien for him and his wife. Do you know yet if the insurance is going to allow them to get more that 90 per year? Please advise. Thanks

## 2013-12-12 NOTE — Telephone Encounter (Signed)
Insurance denied, per  Dr. Mare Ferrari start Restoril 15 mg hs prn. Advised patient

## 2013-12-15 ENCOUNTER — Other Ambulatory Visit: Payer: Self-pay | Admitting: Cardiology

## 2013-12-17 DIAGNOSIS — L821 Other seborrheic keratosis: Secondary | ICD-10-CM | POA: Diagnosis not present

## 2013-12-17 DIAGNOSIS — Z85828 Personal history of other malignant neoplasm of skin: Secondary | ICD-10-CM | POA: Diagnosis not present

## 2013-12-17 DIAGNOSIS — L570 Actinic keratosis: Secondary | ICD-10-CM | POA: Diagnosis not present

## 2013-12-17 DIAGNOSIS — L820 Inflamed seborrheic keratosis: Secondary | ICD-10-CM | POA: Diagnosis not present

## 2013-12-17 DIAGNOSIS — D1739 Benign lipomatous neoplasm of skin and subcutaneous tissue of other sites: Secondary | ICD-10-CM | POA: Diagnosis not present

## 2013-12-30 ENCOUNTER — Other Ambulatory Visit: Payer: Self-pay | Admitting: Cardiology

## 2013-12-30 DIAGNOSIS — G4700 Insomnia, unspecified: Secondary | ICD-10-CM

## 2013-12-30 NOTE — Telephone Encounter (Signed)
Okay to increase dose of temazepam to 30 mg at bedtime

## 2013-12-30 NOTE — Telephone Encounter (Signed)
Will forward to  Dr. Brackbill for review 

## 2013-12-30 NOTE — Telephone Encounter (Signed)
New message    New medication temazepam 15 mg was not helping patient sleep patient states he double medication an that has help.    Need a new prescription for  30 mg    [facility] [place]

## 2013-12-30 NOTE — Telephone Encounter (Signed)
Advised patient and called in new dose

## 2013-12-31 MED ORDER — TEMAZEPAM 30 MG PO CAPS: 30.0000 mg | ORAL_CAPSULE | Freq: Every evening | ORAL | Status: DC | PRN

## 2014-01-02 DIAGNOSIS — M1710 Unilateral primary osteoarthritis, unspecified knee: Secondary | ICD-10-CM | POA: Diagnosis not present

## 2014-01-13 ENCOUNTER — Other Ambulatory Visit (INDEPENDENT_AMBULATORY_CARE_PROVIDER_SITE_OTHER): Payer: Medicare Other

## 2014-01-13 DIAGNOSIS — I10 Essential (primary) hypertension: Secondary | ICD-10-CM | POA: Diagnosis not present

## 2014-01-13 DIAGNOSIS — H40019 Open angle with borderline findings, low risk, unspecified eye: Secondary | ICD-10-CM | POA: Diagnosis not present

## 2014-01-13 DIAGNOSIS — E7800 Pure hypercholesterolemia, unspecified: Secondary | ICD-10-CM

## 2014-01-13 DIAGNOSIS — H52209 Unspecified astigmatism, unspecified eye: Secondary | ICD-10-CM | POA: Diagnosis not present

## 2014-01-13 DIAGNOSIS — Z961 Presence of intraocular lens: Secondary | ICD-10-CM | POA: Diagnosis not present

## 2014-01-13 LAB — LIPID PANEL
CHOL/HDL RATIO: 6
Cholesterol: 230 mg/dL — ABNORMAL HIGH (ref 0–200)
HDL: 40.8 mg/dL (ref >39.00)
LDL Cholesterol: 111 mg/dL — ABNORMAL HIGH (ref 0–99)
NonHDL: 189.2
TRIGLYCERIDES: 393 mg/dL — AB (ref 0.0–149.0)
VLDL: 78.6 mg/dL — ABNORMAL HIGH (ref 0.0–40.0)

## 2014-01-13 LAB — HEPATIC FUNCTION PANEL
ALBUMIN: 4 g/dL (ref 3.5–5.2)
ALK PHOS: 40 U/L (ref 39–117)
ALT: 22 U/L (ref 0–53)
AST: 26 U/L (ref 0–37)
Bilirubin, Direct: 0 mg/dL (ref 0.0–0.3)
TOTAL PROTEIN: 6.9 g/dL (ref 6.0–8.3)
Total Bilirubin: 0.9 mg/dL (ref 0.2–1.2)

## 2014-01-13 LAB — BASIC METABOLIC PANEL WITH GFR
BUN: 14 mg/dL (ref 6–23)
CO2: 27 meq/L (ref 19–32)
Calcium: 8.9 mg/dL (ref 8.4–10.5)
Chloride: 102 meq/L (ref 96–112)
Creatinine, Ser: 1.2 mg/dL (ref 0.4–1.5)
GFR: 64.18 mL/min (ref >60.00)
Glucose, Bld: 116 mg/dL — ABNORMAL HIGH (ref 70–99)
Potassium: 4.2 meq/L (ref 3.5–5.1)
Sodium: 137 meq/L (ref 135–145)

## 2014-01-13 NOTE — Progress Notes (Signed)
Quick Note:  Please make copy of labs for patient visit. ______ 

## 2014-01-16 ENCOUNTER — Encounter: Payer: Self-pay | Admitting: Cardiology

## 2014-01-16 ENCOUNTER — Ambulatory Visit (INDEPENDENT_AMBULATORY_CARE_PROVIDER_SITE_OTHER): Payer: Medicare Other | Admitting: Cardiology

## 2014-01-16 VITALS — BP 138/80 | HR 78 | Ht 73.0 in | Wt 206.8 lb

## 2014-01-16 DIAGNOSIS — R079 Chest pain, unspecified: Secondary | ICD-10-CM | POA: Diagnosis not present

## 2014-01-16 DIAGNOSIS — I2510 Atherosclerotic heart disease of native coronary artery without angina pectoris: Secondary | ICD-10-CM | POA: Diagnosis not present

## 2014-01-16 DIAGNOSIS — I209 Angina pectoris, unspecified: Secondary | ICD-10-CM | POA: Diagnosis not present

## 2014-01-16 DIAGNOSIS — E7800 Pure hypercholesterolemia, unspecified: Secondary | ICD-10-CM

## 2014-01-16 DIAGNOSIS — I119 Hypertensive heart disease without heart failure: Secondary | ICD-10-CM | POA: Diagnosis not present

## 2014-01-16 DIAGNOSIS — E785 Hyperlipidemia, unspecified: Secondary | ICD-10-CM

## 2014-01-16 DIAGNOSIS — I359 Nonrheumatic aortic valve disorder, unspecified: Secondary | ICD-10-CM | POA: Diagnosis not present

## 2014-01-16 DIAGNOSIS — I358 Other nonrheumatic aortic valve disorders: Secondary | ICD-10-CM

## 2014-01-16 DIAGNOSIS — I25119 Atherosclerotic heart disease of native coronary artery with unspecified angina pectoris: Secondary | ICD-10-CM

## 2014-01-16 MED ORDER — ATORVASTATIN CALCIUM 10 MG PO TABS: 10.0000 mg | ORAL_TABLET | Freq: Every day | ORAL | Status: DC

## 2014-01-16 MED ORDER — NITROGLYCERIN 0.4 MG SL SUBL: 0.4000 mg | SUBLINGUAL_TABLET | SUBLINGUAL | Status: DC | PRN

## 2014-01-16 NOTE — Assessment & Plan Note (Signed)
The patient has a history of aortic valve disease.  He has a known basilar systolic murmur.  His last echo was in 2010 he has not been having any symptoms of CHF.  We will have him return for an echocardiogram.

## 2014-01-16 NOTE — Progress Notes (Signed)
Gabriel [name] Date of Birth:  February 22, 1935 [place] [address] [place], [place]  [address] [phone]        Fax   [phone]   History of Present Illness: This pleasant 79 year old gentleman is seen for a scheduled followup office visit. The patient has a history of ischemic heart disease. He had a myocardial infarction in 2003 treated with PCI to his LAD. He also had a stent to his circumflex in 2003. 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 He has a history of hypercholesterolemia and essential hypertension.  Patient also has a past history of kidney stones treated with lithotripsy. He tries to drink a lot of water to keep the kidney stones from reforming. He has a history of erectile dysfunction responding to Viagra. He has been having a lot of GI issues concerning abdominal and gastric bloating and is seen his gastroenterologist Dr. Cristina Gong. He recently had a CT scan of his abdomen which did not reveal any new findings.  He is considering going to [facility] for a second opinion. He had not had any recurrent chest discomfort until this morning when he had some chest tightness at rest.  He took a single sublingual nitroglycerin with relief. His last nuclear stress test was 11/03/09 showing an ejection fraction of 53% and an old inferolateral scar without reversible ischemia. His last echocardiogram 02/19/09 showed mild to moderate left ventricular systolic dysfunction with an ejection fraction of 40-45% and there was diastolic dysfunction.  There was mild aortic sclerosis and mild to moderate aortic insufficiency.   Current Outpatient Prescriptions  Medication Sig Dispense Refill  . amLODipine (NORVASC) 5 MG tablet TAKE 1 TABLET (5 MG TOTAL) BY MOUTH DAILY.  90 tablet  0  . amLODipine (NORVASC) 5 MG tablet TAKE 1 TABLET (5 MG TOTAL) BY MOUTH DAILY.  90 tablet  0  . aspirin 81 MG tablet Take 81  mg by mouth daily.      . clopidogrel (PLAVIX) 75 MG tablet TAKE 1 TABLET (75 MG TOTAL) BY MOUTH DAILY.  90 tablet  0  . ezetimibe (ZETIA) 10 MG tablet Take 1 tablet (10 mg total) by mouth daily.  90 tablet  3  . gabapentin (NEURONTIN) 800 MG tablet Take 1 tablet (800 mg total) by mouth 3 (three) times daily.  270 tablet  5  . lisinopril (PRINIVIL,ZESTRIL) 20 MG tablet TAKE 1 TABLET BY MOUTH EVERY DAY  90 tablet  0  . loratadine (CLARITIN) 10 MG tablet Take 10 mg by mouth daily as needed. For allergies.      . metoprolol succinate (TOPROL-XL) 25 MG 24 hr tablet TAKE 1 TABLET BY MOUTH DAILY  90 tablet  4  . Multiple Vitamins-Minerals (MULTIVITAMINS THER. W/MINERALS) TABS Take 1 tablet by mouth daily.      . nitroGLYCERIN (NITROSTAT) 0.4 MG SL tablet Place 1 tablet (0.4 mg total) under the tongue every 5 (five) minutes as needed. For chest pain.  25 tablet  3  . sildenafil (REVATIO) 20 MG tablet 2 to 2 and 1/2 tablets as needed or as directed  50 tablet  1  . temazepam (RESTORIL) 30 MG capsule Take 1 capsule (30 mg total) by mouth at bedtime as needed for sleep.  30 capsule  5  . atorvastatin (LIPITOR) 10 MG tablet Take 1 tablet (10 mg total) by mouth daily.  30 tablet  3   No  current facility-administered medications for this visit.    Allergies  Allergen Reactions  . Lipitor [Atorvastatin Calcium] Other (See Comments)    Muscle pain  . Mevacor [Lovastatin] Other (See Comments)    "makes me feel loopy"    Patient Active Problem List   Diagnosis Date Noted  . Hypercholesterolemia 10/22/2010    Priority: High  . Aortic valve disease     Priority: Medium  . Aortic insufficiency     Priority: Medium  . OSA on CPAP 09/18/2013  . Sleep apnea 03/20/2012  . 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 Neuropathy 02/21/2011  . Coronary artery disease   . Aortic valve sclerosis   . Benign hypertensive heart disease without heart failure     History    Smoking status  . Former Smoker  Smokeless tobacco  . Never Used    Comment: 1968    History  Alcohol Use  . 0.0 oz/week    Comment: rarely    Family History  Problem Relation Age of Onset  . Heart Problems Mother   . Parkinson's disease Father 39  . Other Sister     Muscular degeneration    Review of Systems: Constitutional: no fever chills diaphoresis or fatigue or change in weight.  Head and neck: no hearing loss, no epistaxis, no photophobia or visual disturbance. Respiratory: No cough, shortness of breath or wheezing. Cardiovascular: No chest pain peripheral edema, palpitations. Gastrointestinal: No abdominal distention, no abdominal pain, no change in bowel habits hematochezia or melena. Genitourinary: No dysuria, no frequency, no urgency, no nocturia. Musculoskeletal:No arthralgias, no back pain, no gait disturbance or myalgias. Neurological: No dizziness, no headaches, no numbness, no seizures, no syncope, no weakness, no tremors. Hematologic: No lymphadenopathy, no easy bruising. Psychiatric: No confusion, no hallucinations, no sleep disturbance.    Physical Exam: Filed Vitals:   01/16/14 0807  BP: 138/80  Pulse: 78   the general appearance reveals a well-developed well-nourished gentleman in no distress.The head and neck exam reveals pupils equal and reactive.  Extraocular movements are full.  There is no scleral icterus.  The mouth and pharynx are normal.  The neck is supple.  The carotids reveal no bruits.  The jugular venous pressure is normal.  The  thyroid is not enlarged.  There is no lymphadenopathy.  The chest is clear to percussion and auscultation.  There are no rales or rhonchi.  Expansion of the chest is symmetrical.  The precordium is quiet.  The first heart sound is normal.  The second heart sound is physiologically split.  There is a soft basilar systolic ejection murmur.  No diastolic murmur is heard. There is no abnormal lift or heave.  The abdomen  is soft and nontender.  The bowel sounds are normal.  The liver and spleen are not enlarged.  There are no abdominal masses.  There are no abdominal bruits.  Extremities reveal good pedal pulses.  There is no phlebitis or edema.  There is no cyanosis or clubbing.  Strength is normal and symmetrical in all extremities.  There is no lateralizing weakness.  There are no sensory deficits.  The skin is warm and dry.  There is no rash.   EKG shows normal sinus rhythm with first degree AV block, bifascicular block.  Since 11/19/12, no significant change to  Assessment / Plan: 1.  Ischemic heart disease status post myocardial infarction in 2003 treated with PCI to LAD and a stent to his  circumflex also in 2003. 2. aortic valve disease 3. hypertensive heart disease without congestive heart failure 4. Dyslipidemia 5. sleep apnea 6. bifascicular block 7. erectile dysfunction 8. chronic abdominal bloating uncertain etiology followed by GI  Plan: Return for treadmill Myoview stress test to evaluate chest tightness.  Return for echocardiogram to update aortic valve status. Resume Lipitor 10 mg one daily. Return in 4 months for office visit lipid panel hepatic function panel and basal metabolic panel, or sooner if necessary.

## 2014-01-16 NOTE — Assessment & Plan Note (Signed)
Pressure has been remaining stable on current therapy.  No dizziness or syncope.

## 2014-01-16 NOTE — Assessment & Plan Note (Addendum)
Patient has a history of hypercholesterolemia.  Recent LDL is 111.  We'll have him restart low-dose generic Lipitor 10 mg daily.  Continue ezetimibe also.

## 2014-01-16 NOTE — Assessment & Plan Note (Signed)
The patient had no chest tightness for many years until this morning.  Today he had some mild precordial pressure which was relieved by a single nitroglycerin.  It occurred at rest.  It has been 4 years since his last nuclear stress test.  We will have him return for a treadmill Myoview stress test.  His EKG today shows no acute changes.

## 2014-01-16 NOTE — Patient Instructions (Signed)
Your physician has recommended you make the following change in your medication: Start lipitor (10 mg ) daily, and [facility] [place] wants you to follow-up in: 4 months with fasting labs (lp/bmet/hfp)   You will receive a reminder letter in the mail two months in advance. If you don't receive a letter, please call our office to schedule the follow-up appointment @ [phone]   Your physician has requested that you have an echocardiogram. Echocardiography is a painless test that uses sound waves to create images of your heart. It provides your doctor with information about the size and shape of your heart and how well your heart's chambers and valves are working. This procedure takes approximately one hour. There are no restrictions for this procedure.   Your physician has requested that you have en exercise stress myoview. For further information please visit [email or web address]. Please follow instruction sheet, as given.

## 2014-01-16 NOTE — Addendum Note (Signed)
Addended by: Tamsen Snider on: 01/16/2014 03:15 PM   Modules accepted: Orders

## 2014-01-20 ENCOUNTER — Other Ambulatory Visit: Payer: Self-pay | Admitting: Cardiology

## 2014-02-03 ENCOUNTER — Encounter (HOSPITAL_COMMUNITY): Payer: Self-pay | Admitting: Pharmacy Technician

## 2014-02-03 ENCOUNTER — Encounter: Payer: Self-pay | Admitting: Cardiology

## 2014-02-03 ENCOUNTER — Ambulatory Visit (HOSPITAL_COMMUNITY): Payer: Medicare Other | Attending: Cardiovascular Disease | Admitting: Radiology

## 2014-02-03 ENCOUNTER — Ambulatory Visit (INDEPENDENT_AMBULATORY_CARE_PROVIDER_SITE_OTHER): Payer: Medicare Other | Admitting: Interventional Cardiology

## 2014-02-03 ENCOUNTER — Encounter: Payer: Self-pay | Admitting: Interventional Cardiology

## 2014-02-03 VITALS — BP 137/82 | HR 62 | Ht 73.0 in | Wt 206.0 lb

## 2014-02-03 VITALS — BP 144/82 | HR 82 | Ht 73.0 in | Wt 206.8 lb

## 2014-02-03 DIAGNOSIS — Z87891 Personal history of nicotine dependence: Secondary | ICD-10-CM | POA: Diagnosis not present

## 2014-02-03 DIAGNOSIS — R079 Chest pain, unspecified: Secondary | ICD-10-CM | POA: Diagnosis not present

## 2014-02-03 DIAGNOSIS — Z9861 Coronary angioplasty status: Secondary | ICD-10-CM | POA: Diagnosis not present

## 2014-02-03 DIAGNOSIS — R9430 Abnormal result of cardiovascular function study, unspecified: Secondary | ICD-10-CM | POA: Insufficient documentation

## 2014-02-03 DIAGNOSIS — I209 Angina pectoris, unspecified: Secondary | ICD-10-CM

## 2014-02-03 DIAGNOSIS — I4510 Unspecified right bundle-branch block: Secondary | ICD-10-CM | POA: Diagnosis not present

## 2014-02-03 DIAGNOSIS — I359 Nonrheumatic aortic valve disorder, unspecified: Secondary | ICD-10-CM

## 2014-02-03 DIAGNOSIS — R0789 Other chest pain: Secondary | ICD-10-CM | POA: Diagnosis not present

## 2014-02-03 DIAGNOSIS — I2510 Atherosclerotic heart disease of native coronary artery without angina pectoris: Secondary | ICD-10-CM | POA: Diagnosis not present

## 2014-02-03 DIAGNOSIS — I252 Old myocardial infarction: Secondary | ICD-10-CM | POA: Insufficient documentation

## 2014-02-03 DIAGNOSIS — R9439 Abnormal result of other cardiovascular function study: Secondary | ICD-10-CM

## 2014-02-03 DIAGNOSIS — I358 Other nonrheumatic aortic valve disorders: Secondary | ICD-10-CM

## 2014-02-03 DIAGNOSIS — R011 Cardiac murmur, unspecified: Secondary | ICD-10-CM

## 2014-02-03 DIAGNOSIS — Z8249 Family history of ischemic heart disease and other diseases of the circulatory system: Secondary | ICD-10-CM | POA: Insufficient documentation

## 2014-02-03 DIAGNOSIS — I25119 Atherosclerotic heart disease of native coronary artery with unspecified angina pectoris: Secondary | ICD-10-CM

## 2014-02-03 DIAGNOSIS — I10 Essential (primary) hypertension: Secondary | ICD-10-CM | POA: Diagnosis not present

## 2014-02-03 LAB — CBC WITH DIFFERENTIAL/PLATELET
Basophils Absolute: 0 10*3/uL (ref 0.0–0.1)
Basophils Relative: 0.3 % (ref 0.0–3.0)
EOS PCT: 2.2 % (ref 0.0–5.0)
Eosinophils Absolute: 0.1 10*3/uL (ref 0.0–0.7)
HCT: 41 % (ref 39.0–52.0)
Hemoglobin: 13.9 g/dL (ref 13.0–17.0)
LYMPHS PCT: 17.6 % (ref 12.0–46.0)
Lymphs Abs: 1.2 10*3/uL (ref 0.7–4.0)
MCHC: 34 g/dL (ref 30.0–36.0)
MCV: 104.5 fl — ABNORMAL HIGH (ref 78.0–100.0)
MONO ABS: 0.7 10*3/uL (ref 0.1–1.0)
Monocytes Relative: 9.8 % (ref 3.0–12.0)
NEUTROS PCT: 70.1 % (ref 43.0–77.0)
Neutro Abs: 4.8 10*3/uL (ref 1.4–7.7)
PLATELETS: 213 10*3/uL (ref 150.0–400.0)
RBC: 3.92 Mil/uL — ABNORMAL LOW (ref 4.22–5.81)
RDW: 12.8 % (ref 11.5–15.5)
WBC: 6.9 10*3/uL (ref 4.0–10.5)

## 2014-02-03 LAB — BASIC METABOLIC PANEL
BUN: 12 mg/dL (ref 6–23)
CHLORIDE: 104 meq/L (ref 96–112)
CO2: 26 mEq/L (ref 19–32)
CREATININE: 1.1 mg/dL (ref 0.4–1.5)
Calcium: 8.9 mg/dL (ref 8.4–10.5)
GFR: 66.8 mL/min (ref >60.00)
Glucose, Bld: 192 mg/dL — ABNORMAL HIGH (ref 70–99)
Potassium: 4.2 mEq/L (ref 3.5–5.1)
SODIUM: 138 meq/L (ref 135–145)

## 2014-02-03 LAB — PROTIME-INR
INR: 1.1 ratio — ABNORMAL HIGH (ref 0.8–1.0)
PROTHROMBIN TIME: 12.6 s (ref 9.6–13.1)

## 2014-02-03 MED ORDER — TECHNETIUM TC 99M SESTAMIBI GENERIC - CARDIOLITE
30.0000 | Freq: Once | INTRAVENOUS | Status: AC | PRN
  Administered 2014-02-03: 30 via INTRAVENOUS

## 2014-02-03 MED ORDER — TECHNETIUM TC 99M SESTAMIBI GENERIC - CARDIOLITE
10.0000 | Freq: Once | INTRAVENOUS | Status: AC | PRN
  Administered 2014-02-03: 10 via INTRAVENOUS

## 2014-02-03 NOTE — Progress Notes (Signed)
Patient ID: Gabriel [name], male   DOB: October 05, 1934, 79 y.o.   MRN: [id number]    [address], [place], [place]  [address] Phone: [phone] Fax:  [phone]  Date:  02/03/2014   ID:  Gabriel [name], DOB Jun 07, 1935, MRN [id number]  PCP:  Darlin Coco, MD      History of Present Illness: Gabriel [name] is a 79 y.o. male who has a long h/o CAD.  He had a PTCA in 1995.  In 2003, he had a stent to another vessel.  H ethen had a rotoblator to the initial PTCA vessel.    Over the past few weeks, he had some chest burning with sexual intercourse.  He had a stress test today.  He had CP with exertion.  He has an anterior, apical reversible defect with on the images.  He had pain that relieved with rest.     Wt Readings from Last 3 Encounters:  02/03/14 206 lb 12.8 oz (93.804 kg)  02/03/14 206 lb (93.441 kg)  01/16/14 206 lb 12.8 oz (93.804 kg)     Past Medical History  Diagnosis Date  . Ischemic heart disease     Remote MI with PCI to LAD in 2003; s/p Stent to LCX in 2003  . Aortic valve disease   . Aortic valve sclerosis   . Aortic insufficiency   . Hypertension   . Neuropathy   . Hyperlipidemia   . Acute cholecystitis   . Coronary artery disease   . Dyslipidemia   . Acute myocardial infarction   . Kidney stone \  . OSA (obstructive sleep apnea)     with upper airway resistancy  . Insomnia   . Vertigo     Had Eppley maneuver, vestibular rehab.    Current Outpatient Prescriptions  Medication Sig Dispense Refill  . amLODipine (NORVASC) 5 MG tablet TAKE 1 TABLET (5 MG TOTAL) BY MOUTH DAILY.  90 tablet  0  . aspirin 81 MG tablet Take 81 mg by mouth daily.      Marland Kitchen atorvastatin (LIPITOR) 10 MG tablet Take 1 tablet (10 mg total) by mouth daily.  30 tablet  3  . clopidogrel (PLAVIX) 75 MG tablet TAKE 1 TABLET (75 MG TOTAL) BY MOUTH DAILY.  90 tablet  0  . ezetimibe (ZETIA) 10 MG tablet Take 1 tablet (10 mg total) by mouth daily.  90 tablet  3  . gabapentin  (NEURONTIN) 800 MG tablet Take 1 tablet (800 mg total) by mouth 3 (three) times daily.  270 tablet  5  . lisinopril (PRINIVIL,ZESTRIL) 20 MG tablet TAKE 1 TABLET BY MOUTH EVERY DAY  90 tablet  0  . loratadine (CLARITIN) 10 MG tablet Take 10 mg by mouth daily as needed. For allergies.      . metoprolol succinate (TOPROL-XL) 25 MG 24 hr tablet TAKE 1 TABLET BY MOUTH DAILY  90 tablet  4  . Multiple Vitamins-Minerals (MULTIVITAMINS THER. W/MINERALS) TABS Take 1 tablet by mouth daily.      . nitroGLYCERIN (NITROSTAT) 0.4 MG SL tablet Place 1 tablet (0.4 mg total) under the tongue every 5 (five) minutes as needed. For chest pain.  25 tablet  3  . sildenafil (REVATIO) 20 MG tablet 2 to 2 and 1/2 tablets as needed or as directed  50 tablet  1  . temazepam (RESTORIL) 30 MG capsule Take 1 capsule (30 mg total) by mouth at bedtime as needed for sleep.  30 capsule  5   No current facility-administered medications for this visit.    Allergies:    Allergies  Allergen Reactions  . Lipitor [Atorvastatin Calcium] Other (See Comments)    Muscle pain--Pt stated he can tolerate a low dose  . Mevacor [Lovastatin] Other (See Comments)    "makes me feel loopy"    Social History:  The patient  reports that he has quit smoking. He has never used smokeless tobacco. He reports that he drinks alcohol. He reports that he does not use illicit drugs.   Family History:  The patient's family history includes Heart Problems in his mother; Other in his sister; Parkinson's disease (age of onset: 57) in his father.   ROS:  Please see the history of present illness.  No nausea, vomiting.  No fevers, chills.  No focal weakness.  No dysuria.    All other systems reviewed and negative.   PHYSICAL EXAM: VS:  BP 144/82  Pulse 82  Ht 6\' 1"  (1.854 m)  Wt 206 lb 12.8 oz (93.804 kg)  BMI 27.29 kg/m2  SpO2 99% Well nourished, well developed, in no acute distress HEENT: normal Neck: no JVD, no carotid bruits Cardiac:  normal  S1, S2; RRR;  Lungs:  clear to auscultation bilaterally, no wheezing, rhonchi or rales Abd: soft, nontender, no hepatomegaly Ext: no edema Skin: warm and dry Neuro:   no focal abnormalities noted  EKG:  NSR, RBBBat baseline; LBBB noted at peak stress    ASSESSMENT AND PLAN:  1. CAD:  Multiple revascularizations in the past.   2. Abnormal stress test: Has sx on 2 antianginals.  Plan for cardiac cath.  Risks and benefits explained the the patient and the wife.  He is willing to proceed. 3. Went vinstructions for NTG use.  WIll have cath scheduled this week.  4. Murmur on exam. KNown aortic sclerosis.  Echo tomorrow.  Signed, Mina Marble, MD, [facility] 02/03/2014 11:48 AM

## 2014-02-03 NOTE — Progress Notes (Signed)
[facility] [address] [place], [place] [address] [phone]    Cardiology Nuclear Med Study  Gabriel [name] is a 79 y.o. male     MRN : [id number]     DOB: Aug 03, 1934  Procedure Date: 02/03/2014  Nuclear Med Background Indication for Stress Test:  Evaluation for Ischemia, Stent Patency and PTCA Patency History:  CAD, MI 2003, Cath, Stent (Cfx), PTCA (LAD), Echo 2010 EF 40-45%, MPI 2011 ([facility]) EF 53% Cardiac Risk Factors: Family History - CAD, History of Smoking, Hypertension, Lipids and RBBB  Symptoms:  Chest Pain with Exertion (last date of chest discomfort was 01/16/14)   Nuclear Pre-Procedure Caffeine/Decaff Intake:  None NPO After: 7:00pm   Lungs:  clear O2 Sat: 95% on room air. IV 0.9% NS with Angio Cath:  20g  IV Site: R Hand  IV Started by:  Matilde Haymaker, RN  Chest Size (in):  44 Cup Size: n/a  Height: 6\' 1"  (1.854 m)  Weight:  206 lb (93.441 kg)  BMI:  Body mass index is 27.18 kg/(m^2). Tech Comments:  No Toprol x 24 hrs    Nuclear Med Study 1 or 2 day study: 1 day  Stress Test Type:  Stress  Reading MD: n/a  Order Authorizing Provider:  Henrene Dodge  Resting Radionuclide: Technetium 40m Sestamibi  Resting Radionuclide Dose: 11.0 mCi   Stress Radionuclide:  Technetium 18m Sestamibi  Stress Radionuclide Dose: 33.0 mCi           Stress Protocol Rest HR: 62 Stress HR: 131  Rest BP: 137/2 Stress BP: 167/72  Exercise Time (min): 7:00 METS: 8.5           Dose of Adenosine (mg):  n/a Dose of Lexiscan: n/a mg  Dose of Atropine (mg): n/a Dose of Dobutamine: n/a mcg/kg/min (at max HR)  Stress Test Technologist: Glade Lloyd, BS-ES  Nuclear Technologist:  Annye Rusk, CNMT     Rest Procedure:  Myocardial perfusion imaging was performed at rest 45 minutes following the intravenous administration of Technetium 94m Sestamibi. Rest ECG: NSR-RBBB  Stress Procedure:  The patient exercised on the treadmill utilizing the  Bruce Protocol for 7:00 minutes. The patient stopped due to 6/10 chest tightness.  Technetium 43m Sestamibi was injected at peak exercise and myocardial perfusion imaging was performed after a brief delay. Stress ECG: ? short run of atrial arrhythmia and NSVT  QPS Raw Data Images:  Normal; no motion artifact; normal heart/lung ratio. Stress Images:  Mixed infarct/ischemia in circumflex/LAD territories Rest Images:  Mixed infarct/ischemia in circumflex/LAD territory Subtraction (SDS):  Mixed infarct/ischemia Transient Ischemic Dilatation (Normal <1.22):  1.00 Lung/Heart Ratio (Normal <0.45):  0.37  Quantitative Gated Spect Images QGS EDV:  168 ml QGS ESV:  103 ml  Impression Exercise Capacity:  Fair exercise capacity. BP Response:  Normal blood pressure response. Clinical Symptoms:  Typical chest pain. ECG Impression:  No significant ST segment change suggestive of ischemia. Comparison with Prior Nuclear Study: No images to compare  Overall Impression:  High risk stress nuclear study Compatable with multivessel disease Distal anterior wall, apical infarct with peri infarct ischemia  Inferolateral wall infarct at mid and basal level with peri infarct ischemia.  Not typical angina with stress and NSVT  Moderate decrease in EF with diffuse hypokinesis.  LV Ejection Fraction: 39%.  LV Wall Motion: Diffuse hypokinesis    Patient seen by MD same day and heart catheterization arranged    Jenkins Rouge

## 2014-02-03 NOTE — Patient Instructions (Signed)
Your physician recommends that you return for lab work today for cbc with diff, bmet, and pt/inr.  Your physician has requested that you have a cardiac catheterization. Cardiac catheterization is used to diagnose and/or treat various heart conditions. Doctors may recommend this procedure for a number of different reasons. The most common reason is to evaluate chest pain. Chest pain can be a symptom of coronary artery disease (CAD), and cardiac catheterization can show whether plaque is narrowing or blocking your heart's arteries. This procedure is also used to evaluate the valves, as well as measure the blood flow and oxygen levels in different parts of your heart. For further information please visit [email or web address]. Please follow instruction sheet, as given.

## 2014-02-04 ENCOUNTER — Other Ambulatory Visit: Payer: Self-pay | Admitting: Interventional Cardiology

## 2014-02-04 ENCOUNTER — Ambulatory Visit (HOSPITAL_COMMUNITY): Payer: Medicare Other | Attending: Cardiology | Admitting: Cardiology

## 2014-02-04 ENCOUNTER — Other Ambulatory Visit (HOSPITAL_COMMUNITY): Payer: Self-pay | Admitting: Interventional Cardiology

## 2014-02-04 DIAGNOSIS — I10 Essential (primary) hypertension: Secondary | ICD-10-CM | POA: Insufficient documentation

## 2014-02-04 DIAGNOSIS — I079 Rheumatic tricuspid valve disease, unspecified: Secondary | ICD-10-CM | POA: Insufficient documentation

## 2014-02-04 DIAGNOSIS — E785 Hyperlipidemia, unspecified: Secondary | ICD-10-CM | POA: Diagnosis not present

## 2014-02-04 DIAGNOSIS — I2510 Atherosclerotic heart disease of native coronary artery without angina pectoris: Secondary | ICD-10-CM | POA: Insufficient documentation

## 2014-02-04 DIAGNOSIS — Z87891 Personal history of nicotine dependence: Secondary | ICD-10-CM | POA: Diagnosis not present

## 2014-02-04 DIAGNOSIS — R9430 Abnormal result of cardiovascular function study, unspecified: Secondary | ICD-10-CM

## 2014-02-04 DIAGNOSIS — I359 Nonrheumatic aortic valve disorder, unspecified: Secondary | ICD-10-CM

## 2014-02-04 NOTE — Progress Notes (Signed)
Echo performed. 

## 2014-02-05 ENCOUNTER — Ambulatory Visit (HOSPITAL_COMMUNITY)
Admission: RE | Admit: 2014-02-05 | Discharge: 2014-02-06 | Disposition: A | Discharge status: Home / Self Care | Payer: Medicare Other | Source: Ambulatory Visit | Attending: Interventional Cardiology | Admitting: Interventional Cardiology

## 2014-02-05 ENCOUNTER — Encounter (HOSPITAL_COMMUNITY)
Admission: RE | Disposition: A | Discharge status: Home / Self Care | Payer: Self-pay | Source: Ambulatory Visit | Attending: Interventional Cardiology

## 2014-02-05 ENCOUNTER — Encounter (HOSPITAL_COMMUNITY): Payer: Self-pay | Admitting: General Practice

## 2014-02-05 ENCOUNTER — Other Ambulatory Visit: Payer: Self-pay

## 2014-02-05 DIAGNOSIS — I359 Nonrheumatic aortic valve disorder, unspecified: Secondary | ICD-10-CM | POA: Insufficient documentation

## 2014-02-05 DIAGNOSIS — Y849 Medical procedure, unspecified as the cause of abnormal reaction of the patient, or of later complication, without mention of misadventure at the time of the procedure: Secondary | ICD-10-CM | POA: Insufficient documentation

## 2014-02-05 DIAGNOSIS — Z7982 Long term (current) use of aspirin: Secondary | ICD-10-CM | POA: Insufficient documentation

## 2014-02-05 DIAGNOSIS — I2510 Atherosclerotic heart disease of native coronary artery without angina pectoris: Secondary | ICD-10-CM | POA: Diagnosis not present

## 2014-02-05 DIAGNOSIS — Z23 Encounter for immunization: Secondary | ICD-10-CM | POA: Diagnosis not present

## 2014-02-05 DIAGNOSIS — E785 Hyperlipidemia, unspecified: Secondary | ICD-10-CM | POA: Diagnosis not present

## 2014-02-05 DIAGNOSIS — T82898A Other specified complication of vascular prosthetic devices, implants and grafts, initial encounter: Secondary | ICD-10-CM | POA: Diagnosis not present

## 2014-02-05 DIAGNOSIS — R61 Generalized hyperhidrosis: Secondary | ICD-10-CM | POA: Insufficient documentation

## 2014-02-05 DIAGNOSIS — R9430 Abnormal result of cardiovascular function study, unspecified: Secondary | ICD-10-CM

## 2014-02-05 DIAGNOSIS — I10 Essential (primary) hypertension: Secondary | ICD-10-CM | POA: Diagnosis not present

## 2014-02-05 DIAGNOSIS — I209 Angina pectoris, unspecified: Secondary | ICD-10-CM | POA: Diagnosis not present

## 2014-02-05 DIAGNOSIS — Z9861 Coronary angioplasty status: Secondary | ICD-10-CM | POA: Diagnosis not present

## 2014-02-05 DIAGNOSIS — E7800 Pure hypercholesterolemia, unspecified: Secondary | ICD-10-CM

## 2014-02-05 DIAGNOSIS — G4733 Obstructive sleep apnea (adult) (pediatric): Secondary | ICD-10-CM | POA: Insufficient documentation

## 2014-02-05 DIAGNOSIS — I4510 Unspecified right bundle-branch block: Secondary | ICD-10-CM | POA: Diagnosis not present

## 2014-02-05 DIAGNOSIS — I440 Atrioventricular block, first degree: Secondary | ICD-10-CM | POA: Insufficient documentation

## 2014-02-05 DIAGNOSIS — Z7902 Long term (current) use of antithrombotics/antiplatelets: Secondary | ICD-10-CM | POA: Diagnosis not present

## 2014-02-05 DIAGNOSIS — Z87891 Personal history of nicotine dependence: Secondary | ICD-10-CM | POA: Diagnosis not present

## 2014-02-05 DIAGNOSIS — G4700 Insomnia, unspecified: Secondary | ICD-10-CM | POA: Diagnosis not present

## 2014-02-05 DIAGNOSIS — I252 Old myocardial infarction: Secondary | ICD-10-CM | POA: Diagnosis not present

## 2014-02-05 DIAGNOSIS — E782 Mixed hyperlipidemia: Secondary | ICD-10-CM | POA: Diagnosis present

## 2014-02-05 DIAGNOSIS — Z955 Presence of coronary angioplasty implant and graft: Secondary | ICD-10-CM

## 2014-02-05 DIAGNOSIS — I255 Ischemic cardiomyopathy: Secondary | ICD-10-CM | POA: Diagnosis present

## 2014-02-05 DIAGNOSIS — K219 Gastro-esophageal reflux disease without esophagitis: Secondary | ICD-10-CM | POA: Insufficient documentation

## 2014-02-05 DIAGNOSIS — I25110 Atherosclerotic heart disease of native coronary artery with unstable angina pectoris: Secondary | ICD-10-CM

## 2014-02-05 DIAGNOSIS — I25119 Atherosclerotic heart disease of native coronary artery with unspecified angina pectoris: Secondary | ICD-10-CM

## 2014-02-05 HISTORY — DX: Atherosclerotic heart disease of native coronary artery without angina pectoris: I25.10

## 2014-02-05 HISTORY — PX: LEFT HEART CATHETERIZATION WITH CORONARY ANGIOGRAM: SHX5451

## 2014-02-05 HISTORY — PX: LEFT HEART CATH: SHX5946

## 2014-02-05 HISTORY — DX: Ischemic cardiomyopathy: I25.5

## 2014-02-05 HISTORY — DX: Atrioventricular block, first degree: I44.0

## 2014-02-05 HISTORY — PX: PERCUTANEOUS CORONARY STENT INTERVENTION (PCI-S): SHX5485

## 2014-02-05 HISTORY — DX: Gastro-esophageal reflux disease without esophagitis: K21.9

## 2014-02-05 HISTORY — DX: Unspecified right bundle-branch block: I45.10

## 2014-02-05 LAB — POCT ACTIVATED CLOTTING TIME
ACTIVATED CLOTTING TIME: 242 s
ACTIVATED CLOTTING TIME: 253 s
Activated Clotting Time: 259 seconds

## 2014-02-05 SURGERY — LEFT HEART CATHETERIZATION WITH CORONARY ANGIOGRAM
Anesthesia: LOCAL | Site: Hand | Laterality: Right

## 2014-02-05 MED ORDER — LORATADINE 10 MG PO TABS
10.0000 mg | ORAL_TABLET | Freq: Every day | ORAL | Status: DC | PRN
  Filled 2014-02-05: qty 1

## 2014-02-05 MED ORDER — SODIUM CHLORIDE 0.9 % IJ SOLN: 3.0000 mL | Freq: Two times a day (BID) | INTRAMUSCULAR | Status: DC

## 2014-02-05 MED ORDER — ACETAMINOPHEN 325 MG PO TABS: 650.0000 mg | ORAL_TABLET | ORAL | Status: DC | PRN

## 2014-02-05 MED ORDER — HEPARIN SODIUM (PORCINE) 1000 UNIT/ML IJ SOLN
INTRAMUSCULAR | Status: AC
  Filled 2014-02-05: qty 1

## 2014-02-05 MED ORDER — CLOPIDOGREL BISULFATE 75 MG PO TABS: 300.0000 mg | ORAL_TABLET | Freq: Once | ORAL | Status: DC

## 2014-02-05 MED ORDER — EZETIMIBE 10 MG PO TABS
10.0000 mg | ORAL_TABLET | Freq: Every day | ORAL | Status: DC
  Filled 2014-02-05: qty 1

## 2014-02-05 MED ORDER — SODIUM CHLORIDE 0.9 % IV SOLN
1.0000 mL/kg/h | INTRAVENOUS | Status: AC
  Administered 2014-02-05 (×2): 1 mL/kg/h via INTRAVENOUS

## 2014-02-05 MED ORDER — SODIUM CHLORIDE 0.9 % IV SOLN: 250.0000 mL | INTRAVENOUS | Status: DC | PRN

## 2014-02-05 MED ORDER — LISINOPRIL 20 MG PO TABS
20.0000 mg | ORAL_TABLET | Freq: Every day | ORAL | Status: DC
  Filled 2014-02-05: qty 1

## 2014-02-05 MED ORDER — NITROGLYCERIN 0.2 MG/ML ON CALL CATH LAB
INTRAVENOUS | Status: AC
  Filled 2014-02-05: qty 1

## 2014-02-05 MED ORDER — GABAPENTIN 400 MG PO CAPS
800.0000 mg | ORAL_CAPSULE | Freq: Three times a day (TID) | ORAL | Status: DC
  Administered 2014-02-05 (×2): 800 mg via ORAL
  Filled 2014-02-05 (×6): qty 2

## 2014-02-05 MED ORDER — VERAPAMIL HCL 2.5 MG/ML IV SOLN
INTRAVENOUS | Status: AC
  Filled 2014-02-05: qty 2

## 2014-02-05 MED ORDER — ASPIRIN 81 MG PO CHEW: 81.0000 mg | CHEWABLE_TABLET | Freq: Every day | ORAL | Status: DC

## 2014-02-05 MED ORDER — ASPIRIN EC 81 MG PO TBEC
81.0000 mg | DELAYED_RELEASE_TABLET | Freq: Every day | ORAL | Status: DC
  Filled 2014-02-05: qty 1

## 2014-02-05 MED ORDER — LIDOCAINE HCL (PF) 1 % IJ SOLN
INTRAMUSCULAR | Status: AC
  Filled 2014-02-05: qty 30

## 2014-02-05 MED ORDER — FENTANYL CITRATE 0.05 MG/ML IJ SOLN
INTRAMUSCULAR | Status: AC
  Filled 2014-02-05: qty 2

## 2014-02-05 MED ORDER — MIDAZOLAM HCL 2 MG/2ML IJ SOLN
INTRAMUSCULAR | Status: AC
  Filled 2014-02-05: qty 2

## 2014-02-05 MED ORDER — CLOPIDOGREL BISULFATE 75 MG PO TABS
75.0000 mg | ORAL_TABLET | Freq: Every day | ORAL | Status: DC
  Filled 2014-02-05: qty 1

## 2014-02-05 MED ORDER — CLOPIDOGREL BISULFATE 75 MG PO TABS: 75.0000 mg | ORAL_TABLET | Freq: Every day | ORAL | Status: DC

## 2014-02-05 MED ORDER — METOPROLOL SUCCINATE ER 25 MG PO TB24
25.0000 mg | ORAL_TABLET | Freq: Every day | ORAL | Status: DC
  Filled 2014-02-05: qty 1

## 2014-02-05 MED ORDER — HEPARIN (PORCINE) IN NACL 2-0.9 UNIT/ML-% IJ SOLN
INTRAMUSCULAR | Status: AC
  Filled 2014-02-05: qty 1500

## 2014-02-05 MED ORDER — NITROGLYCERIN 0.4 MG SL SUBL: 0.4000 mg | SUBLINGUAL_TABLET | SUBLINGUAL | Status: DC | PRN

## 2014-02-05 MED ORDER — BIVALIRUDIN 250 MG IV SOLR
INTRAVENOUS | Status: AC
  Filled 2014-02-05: qty 250

## 2014-02-05 MED ORDER — GABAPENTIN 800 MG PO TABS
800.0000 mg | ORAL_TABLET | Freq: Three times a day (TID) | ORAL | Status: DC
  Filled 2014-02-05 (×2): qty 1

## 2014-02-05 MED ORDER — SODIUM CHLORIDE 0.9 % IJ SOLN: 3.0000 mL | INTRAMUSCULAR | Status: DC | PRN

## 2014-02-05 MED ORDER — AMLODIPINE BESYLATE 5 MG PO TABS
5.0000 mg | ORAL_TABLET | Freq: Every day | ORAL | Status: DC
  Filled 2014-02-05: qty 1

## 2014-02-05 MED ORDER — ATORVASTATIN CALCIUM 10 MG PO TABS
10.0000 mg | ORAL_TABLET | Freq: Every day | ORAL | Status: DC
  Filled 2014-02-05: qty 1

## 2014-02-05 MED ORDER — TEMAZEPAM 15 MG PO CAPS: 30.0000 mg | ORAL_CAPSULE | Freq: Every evening | ORAL | Status: DC | PRN

## 2014-02-05 MED ORDER — SODIUM CHLORIDE 0.9 % IV SOLN
INTRAVENOUS | Status: DC
  Administered 2014-02-05: 10:00:00 via INTRAVENOUS

## 2014-02-05 MED ORDER — ONDANSETRON HCL 4 MG/2ML IJ SOLN: 4.0000 mg | Freq: Four times a day (QID) | INTRAMUSCULAR | Status: DC | PRN

## 2014-02-05 MED ORDER — ASPIRIN 81 MG PO CHEW: 81.0000 mg | CHEWABLE_TABLET | ORAL | Status: DC

## 2014-02-05 MED ORDER — PNEUMOCOCCAL VAC POLYVALENT 25 MCG/0.5ML IJ INJ
0.5000 mL | INJECTION | INTRAMUSCULAR | Status: AC
  Administered 2014-02-06: 0.5 mL via INTRAMUSCULAR
  Filled 2014-02-05: qty 0.5

## 2014-02-05 MED ORDER — SILDENAFIL CITRATE 20 MG PO TABS
40.0000 mg | ORAL_TABLET | Freq: Every day | ORAL | Status: DC | PRN
  Filled 2014-02-05: qty 2

## 2014-02-05 NOTE — Progress Notes (Signed)
TR BAND REMOVAL  LOCATION:    right radial  DEFLATED PER PROTOCOL:    Yes.    TIME BAND OFF / DRESSING APPLIED:    1900   SITE UPON ARRIVAL:    Level 0  SITE AFTER BAND REMOVAL:    Level 0  REVERSE ALLEN'S TEST:     positive  CIRCULATION SENSATION AND MOVEMENT:    Within Normal Limits   Yes.    COMMENTS:   Tolerated procedure well 

## 2014-02-05 NOTE — Interval H&P Note (Signed)
Cath Lab Visit (complete for each Cath Lab visit)  Clinical Evaluation Leading to the Procedure:   ACS: No.  Non-ACS:    Anginal Classification: CCS III  Anti-ischemic medical therapy: Maximal Therapy (2 or more classes of medications)  Non-Invasive Test Results: High-risk stress test findings: cardiac mortality >3%/year  Prior CABG: No previous CABG      History and Physical Interval Note:  02/05/2014 12:47 PM  Gabriel [name]  has presented today for surgery, with the diagnosis of abnormal nuc/cp  The various methods of treatment have been discussed with the patient and family. After consideration of risks, benefits and other options for treatment, the patient has consented to  Procedure(s): LEFT HEART CATHETERIZATION WITH CORONARY ANGIOGRAM (N/A) as a surgical intervention .  The patient's history has been reviewed, patient examined, no change in status, stable for surgery.  I have reviewed the patient's chart and labs.  Questions were answered to the patient's satisfaction.     Gabriel [name] S.

## 2014-02-05 NOTE — Care Management Note (Addendum)
  Page 1 of 1   02/05/2014     3:22:18 PM CARE MANAGEMENT NOTE 02/05/2014  Patient:  IZZY, COURVILLE   Account Number:  [id number]  Date Initiated:  02/05/2014  Documentation initiated by:  Letesha Klecker  Subjective/Objective Assessment:   PROCEDURE:  Left heart catheterization with selective coronary angiography, left ventriculogram.  PCI LAD     Action/Plan:   CM to follow for disposition needs   Anticipated DC Date:  02/06/2014   Anticipated DC Plan:           Choice offered to / List presented to:             Status of service:  Completed, signed off Medicare Important Message given?   (If response is "NO", the following Medicare IM given date fields will be blank) Date Medicare IM given:   Medicare IM given by:   Date Additional Medicare IM given:   Additional Medicare IM given by:    Discharge Disposition:  HOME/SELF CARE  Per UR Regulation:    If discussed at Long Length of Stay Meetings, dates discussed:    Comments:  Gamble Enderle RN, BSN, MSHL, CCM  Nurse - Case Manager, (Unit [phone]  02/05/2014 PROCEDURE:  Left heart catheterization with selective coronary angiography, left ventriculogram.  PCI LAD Med Review:  Plavix Disposition Plan:  Home / Self Care.

## 2014-02-05 NOTE — CV Procedure (Signed)
PROCEDURE:  Left heart catheterization with selective coronary angiography, left ventriculogram.  PCI LAD  INDICATIONS:  High-risk abnormal stress test, angina  The risks, benefits, and details of the procedure were explained to the patient.  The patient verbalized understanding and wanted to proceed.  Informed written consent was obtained.  PROCEDURE TECHNIQUE:  After Xylocaine anesthesia a 29F slender sheath was placed in the right radial artery with a single anterior needle wall stick. IV heparin was given.  Right coronary angiography was done using a Judkins R4 guide catheter.  Left coronary angiography was done using a Judkins L3.5 guide catheter.  Left ventriculography was done using a pigtail catheter. The intervention was performed. Please see below for details. A TR band was used for hemostasis.   CONTRAST:  Total of 170 cc.  COMPLICATIONS:  None.    HEMODYNAMICS:  Aortic pressure was 129/67; LV pressure was 141/11; LVEDP 19.  There was no gradient between the left ventricle and aorta.    ANGIOGRAPHIC DATA:   The left main coronary artery is a short vessel but widely patent.  The left anterior descending artery is a large vessel which wraps around the apex. There is a large first diagonal which is patent. The second diagonal is patent. In the mid LAD, there is a widely patent stent. This stent covers the origin of the second diagonal. After a more tortuous segment in the mid LAD, there is a focal 90% stenosis with an apparent mild, spontaneous dissection at the plaque. The mid to distal LAD is widely patent. There is mild calcification of this area at the focal stenosis in the mid LAD.  The left circumflex artery is a large vessel extending across the lateral wall. There is mild, proximal disease. The stents in the mid vessel has mild in-stent restenosis. There is a small first obtuse marginal which is patent. The second obtuse marginal which is a first significant branch has  mild disease proximally. This is a prior angioplasty site. The continuation of the circumflex after this branch has moderate disease. The third obtuse marginal is widely patent.  The right coronary artery is a large dominant vessel. In the mid vessel, there is moderate disease. The stent in the distal RCA has mild, in-stent restenosis. The posterior lateral artery is medium-sized and patent. The posterior descending artery is widely patent.  LEFT VENTRICULOGRAM:  Left ventricular angiogram was done in the 30 RAO projection and revealed mildly decreased systolic function globally with an estimated ejection fraction of 40-45 %.  LVEDP was 19 mmHg.  PCI NARRATIVE: A CLS 3.0 guiding catheter was used to engage the left main. It was selected in the LAD. Additional heparin was given for any coagulation. ACT was used to check that the heparin was therapeutic. A pro-water wire was placed across the area disease in the mid LAD. It was noted that the pro-water wire was difficult to advance, likely due to calcification in the mid vessel. Initially, a 2.5 x 12 balloon was advanced but would not cross the tortuosity in the mid LAD. A 1.5 balloon was then advanced but would not cross the lesion. A 1.5 balloon was inflated in the hopes that it would help create a tract to cross the lesion. This was unsuccessful. A 1.2 x 12 balloon was then advanced successfully crossed the lesion. Was inflated to high pressure. A BMW wire was placed as a buddy wire in the LAD. The 1.5 balloon was then placed across the  lesion and inflated to high pressure. A 2.0 x 12 balloon was then used to further predilate, inflated to 14 atmospheres. A 2.25 x 12 balloon was then used to predilate. The balloons would inflate fully, but there was significant recoil after the balloon was deflated.  A 2.25 x 12 Promus drug-eluting stent was then advanced to the area disease and deployed at 16 atmospheres. There is no residual stenosis, a 2.5 x 8 balloon was  used to post dilate the stented segment. Several doses of nitroglycerin were given during the procedure. There was an excellent angiographic result.  IMPRESSIONS:  1. Normal left main coronary artery. 2. 90% focal stenosis in the mid left anterior descending artery. This was successfully treated with a 2.25 x 12 promus drug-eluting stent, postdilated to 2.6 mm in diameter. Previously placed stent in the more proximal portion of the LAD is widely patent. 3. Patent stents in the mid left circumflex artery. Mild to moderate disease in the circumflex system. 4. Mild to moderate disease right coronary artery system. The distal RCA stent has mild in-stent restenosis. 5. Mildly decreased left ventricular systolic function.  LVEDP 19 mmHg.  Ejection fraction 40-45%.  RECOMMENDATION:  Continue dual antiplatelet therapy indefinitely. The angiogram does not reveal much calcium in the LAD. It was difficult to get devices to the mid LAD due to tortuosity and likely calcification. He'll be watched overnight. Consider cardiac rehabilitation. If there no complications, anticipate discharge tomorrow. Followup with Dr. Mare Ferrari.

## 2014-02-05 NOTE — H&P (View-Only) (Signed)
Patient ID: Gabriel [name], male   DOB: January 03, 1935, 79 y.o.   MRN: [id number]    [place] [place], [place]  [address] Phone: [phone] Fax:  [phone]  Date:  02/03/2014   ID:  Gabriel [name], DOB Jun 19, 1935, MRN [id number]  PCP:  Darlin Coco, MD      History of Present Illness: Gabriel [name] is a 79 y.o. male who has a long h/o CAD.  He had a PTCA in 1995.  In 2003, he had a stent to another vessel.  H ethen had a rotoblator to the initial PTCA vessel.    Over the past few weeks, he had some chest burning with sexual intercourse.  He had a stress test today.  He had CP with exertion.  He has an anterior, apical reversible defect with on the images.  He had pain that relieved with rest.     Wt Readings from Last 3 Encounters:  02/03/14 206 lb 12.8 oz (93.804 kg)  02/03/14 206 lb (93.441 kg)  01/16/14 206 lb 12.8 oz (93.804 kg)     Past Medical History  Diagnosis Date  . Ischemic heart disease     Remote MI with PCI to LAD in 2003; s/p Stent to LCX in 2003  . Aortic valve disease   . Aortic valve sclerosis   . Aortic insufficiency   . Hypertension   . Neuropathy   . Hyperlipidemia   . Acute cholecystitis   . Coronary artery disease   . Dyslipidemia   . Acute myocardial infarction   . Kidney stone \  . OSA (obstructive sleep apnea)     with upper airway resistancy  . Insomnia   . Vertigo     Had Eppley maneuver, vestibular rehab.    Current Outpatient Prescriptions  Medication Sig Dispense Refill  . amLODipine (NORVASC) 5 MG tablet TAKE 1 TABLET (5 MG TOTAL) BY MOUTH DAILY.  90 tablet  0  . aspirin 81 MG tablet Take 81 mg by mouth daily.      Marland Kitchen atorvastatin (LIPITOR) 10 MG tablet Take 1 tablet (10 mg total) by mouth daily.  30 tablet  3  . clopidogrel (PLAVIX) 75 MG tablet TAKE 1 TABLET (75 MG TOTAL) BY MOUTH DAILY.  90 tablet  0  . ezetimibe (ZETIA) 10 MG tablet Take 1 tablet (10 mg total) by mouth daily.  90 tablet  3  . gabapentin  (NEURONTIN) 800 MG tablet Take 1 tablet (800 mg total) by mouth 3 (three) times daily.  270 tablet  5  . lisinopril (PRINIVIL,ZESTRIL) 20 MG tablet TAKE 1 TABLET BY MOUTH EVERY DAY  90 tablet  0  . loratadine (CLARITIN) 10 MG tablet Take 10 mg by mouth daily as needed. For allergies.      . metoprolol succinate (TOPROL-XL) 25 MG 24 hr tablet TAKE 1 TABLET BY MOUTH DAILY  90 tablet  4  . Multiple Vitamins-Minerals (MULTIVITAMINS THER. W/MINERALS) TABS Take 1 tablet by mouth daily.      . nitroGLYCERIN (NITROSTAT) 0.4 MG SL tablet Place 1 tablet (0.4 mg total) under the tongue every 5 (five) minutes as needed. For chest pain.  25 tablet  3  . sildenafil (REVATIO) 20 MG tablet 2 to 2 and 1/2 tablets as needed or as directed  50 tablet  1  . temazepam (RESTORIL) 30 MG capsule Take 1 capsule (30 mg total) by mouth at bedtime as needed for sleep.  30 capsule  5   No current facility-administered medications for this visit.    Allergies:    Allergies  Allergen Reactions  . Lipitor [Atorvastatin Calcium] Other (See Comments)    Muscle pain--Pt stated he can tolerate a low dose  . Mevacor [Lovastatin] Other (See Comments)    "makes me feel loopy"    Social History:  The patient  reports that he has quit smoking. He has never used smokeless tobacco. He reports that he drinks alcohol. He reports that he does not use illicit drugs.   Family History:  The patient's family history includes Heart Problems in his mother; Other in his sister; Parkinson's disease (age of onset: 65) in his father.   ROS:  Please see the history of present illness.  No nausea, vomiting.  No fevers, chills.  No focal weakness.  No dysuria.    All other systems reviewed and negative.   PHYSICAL EXAM: VS:  BP 144/82  Pulse 82  Ht 6\' 1"  (1.854 m)  Wt 206 lb 12.8 oz (93.804 kg)  BMI 27.29 kg/m2  SpO2 99% Well nourished, well developed, in no acute distress HEENT: normal Neck: no JVD, no carotid bruits Cardiac:  normal  S1, S2; RRR;  Lungs:  clear to auscultation bilaterally, no wheezing, rhonchi or rales Abd: soft, nontender, no hepatomegaly Ext: no edema Skin: warm and dry Neuro:   no focal abnormalities noted  EKG:  NSR, RBBBat baseline; LBBB noted at peak stress    ASSESSMENT AND PLAN:  1. CAD:  Multiple revascularizations in the past.   2. Abnormal stress test: Has sx on 2 antianginals.  Plan for cardiac cath.  Risks and benefits explained the the patient and the wife.  He is willing to proceed. 3. Went vinstructions for NTG use.  WIll have cath scheduled this week.  4. Murmur on exam. KNown aortic sclerosis.  Echo tomorrow.  Signed, Mina Marble, MD, [facility] 02/03/2014 11:48 AM

## 2014-02-06 ENCOUNTER — Encounter (HOSPITAL_COMMUNITY): Payer: Self-pay | Admitting: Physician Assistant

## 2014-02-06 DIAGNOSIS — R9430 Abnormal result of cardiovascular function study, unspecified: Secondary | ICD-10-CM | POA: Diagnosis not present

## 2014-02-06 DIAGNOSIS — I359 Nonrheumatic aortic valve disorder, unspecified: Secondary | ICD-10-CM | POA: Diagnosis present

## 2014-02-06 DIAGNOSIS — I255 Ischemic cardiomyopathy: Secondary | ICD-10-CM | POA: Diagnosis present

## 2014-02-06 DIAGNOSIS — E782 Mixed hyperlipidemia: Secondary | ICD-10-CM | POA: Diagnosis present

## 2014-02-06 DIAGNOSIS — N200 Calculus of kidney: Secondary | ICD-10-CM | POA: Insufficient documentation

## 2014-02-06 DIAGNOSIS — I209 Angina pectoris, unspecified: Secondary | ICD-10-CM | POA: Diagnosis not present

## 2014-02-06 DIAGNOSIS — R42 Dizziness and giddiness: Secondary | ICD-10-CM | POA: Insufficient documentation

## 2014-02-06 DIAGNOSIS — I10 Essential (primary) hypertension: Secondary | ICD-10-CM | POA: Diagnosis not present

## 2014-02-06 DIAGNOSIS — I440 Atrioventricular block, first degree: Secondary | ICD-10-CM | POA: Diagnosis present

## 2014-02-06 DIAGNOSIS — I200 Unstable angina: Secondary | ICD-10-CM

## 2014-02-06 DIAGNOSIS — E7800 Pure hypercholesterolemia, unspecified: Secondary | ICD-10-CM

## 2014-02-06 DIAGNOSIS — I2510 Atherosclerotic heart disease of native coronary artery without angina pectoris: Secondary | ICD-10-CM | POA: Diagnosis not present

## 2014-02-06 DIAGNOSIS — G4733 Obstructive sleep apnea (adult) (pediatric): Secondary | ICD-10-CM | POA: Insufficient documentation

## 2014-02-06 DIAGNOSIS — K219 Gastro-esophageal reflux disease without esophagitis: Secondary | ICD-10-CM | POA: Insufficient documentation

## 2014-02-06 DIAGNOSIS — Z9861 Coronary angioplasty status: Secondary | ICD-10-CM | POA: Diagnosis not present

## 2014-02-06 DIAGNOSIS — Z23 Encounter for immunization: Secondary | ICD-10-CM | POA: Diagnosis not present

## 2014-02-06 LAB — BASIC METABOLIC PANEL
ANION GAP: 14 (ref 5–15)
BUN: 12 mg/dL (ref 6–23)
CALCIUM: 8.4 mg/dL (ref 8.4–10.5)
CO2: 24 mEq/L (ref 19–32)
Chloride: 105 mEq/L (ref 96–112)
Creatinine, Ser: 1.03 mg/dL (ref 0.50–1.35)
GFR, EST AFRICAN AMERICAN: 79 mL/min — AB (ref >90)
GFR, EST NON AFRICAN AMERICAN: 68 mL/min — AB (ref >90)
Glucose, Bld: 105 mg/dL — ABNORMAL HIGH (ref 70–99)
Potassium: 4.2 mEq/L (ref 3.7–5.3)
Sodium: 143 mEq/L (ref 137–147)

## 2014-02-06 LAB — CBC
HCT: 36.7 % — ABNORMAL LOW (ref 39.0–52.0)
HEMOGLOBIN: 12.8 g/dL — AB (ref 13.0–17.0)
MCH: 35.4 pg — AB (ref 26.0–34.0)
MCHC: 34.9 g/dL (ref 30.0–36.0)
MCV: 101.4 fL — ABNORMAL HIGH (ref 78.0–100.0)
PLATELETS: 174 10*3/uL (ref 150–400)
RBC: 3.62 MIL/uL — ABNORMAL LOW (ref 4.22–5.81)
RDW: 12.9 % (ref 11.5–15.5)
WBC: 6.6 10*3/uL (ref 4.0–10.5)

## 2014-02-06 NOTE — Progress Notes (Signed)
  Patient: Gabriel [name] / Admit Date: 02/05/2014 / Date of Encounter: 02/06/2014, 6:29 AM   Subjective: Feels good. No chest pain or SOB. Back feels stiff from laying in bed.   Objective: Telemetry: NSR occ PVCs (alarms rang for VT but this is artifact - QRS visible throughout; nursing states this was when patient was moving around quickly in bed) Physical Exam: Blood pressure 105/51, pulse 70, temperature 98.4 F (36.9 C), temperature source Oral, resp. rate 18, height 6\' 1"  (1.854 m), weight 207 lb 3.7 oz (94 kg), SpO2 93.00%. General: Well developed, well nourished WM, in no acute distress. Head: Normocephalic, atraumatic, sclera non-icteric, no xanthomas, nares are without discharge. Neck: JVP not elevated. Lungs: Clear bilaterally to auscultation without wheezes, rales, or rhonchi. Breathing is unlabored. Heart: RRR S1 S2 mild SEM, no murmurs, rubs, or gallops.  Abdomen: Soft, non-tender, non-distended with normoactive bowel sounds. No rebound/guarding. Extremities: No clubbing or cyanosis. No edema. Distal pedal pulses are 2+ and equal bilaterally. R wrist cath site without hematoma, ecchymosis; pulse is in tact Neuro: Alert and oriented X 3. Moves all extremities spontaneously. Psych:  Responds to questions appropriately with a normal affect.   Intake/Output Summary (Last 24 hours) at 02/06/14 0629 Last data filed at 02/05/14 2200  Gross per 24 hour  Intake 990.45 ml  Output   1020 ml  Net -29.55 ml    Inpatient Medications:  . amLODipine  5 mg Oral Daily  . aspirin EC  81 mg Oral Daily  . atorvastatin  10 mg Oral q1800  . clopidogrel  300 mg Oral Once  . clopidogrel  75 mg Oral Daily  . ezetimibe  10 mg Oral Daily  . gabapentin  800 mg Oral TID  . lisinopril  20 mg Oral Daily  . metoprolol succinate  25 mg Oral Daily  . pneumococcal 23 valent vaccine  0.5 mL Intramuscular Tomorrow-1000   Infusions:    Labs:  Recent Labs  02/03/14 1213 02/06/14 0325  NA 138 143   K 4.2 4.2  CL 104 105  CO2 26 24  GLUCOSE 192* 105*  BUN 12 12  CREATININE 1.1 1.03  CALCIUM 8.9 8.4    Recent Labs  02/03/14 1213 02/06/14 0325  WBC 6.9 6.6  NEUTROABS 4.8  --   HGB 13.9 12.8*  HCT 41.0 36.7*  MCV 104.5* 101.4*  PLT 213.0 174   Radiology/Studies:  Cath, see report EKG NSR 1st degree AVB/RBBB   Assessment and Plan  1. CAD s/p PTCA 1995, MI in 2003 s/p stenting to LAD 2003, s/p stent to Cx 2003, prior rotablator to PTCA vessel, with abnormal stress test - cath 02/05/14 with DES to LAD, mild-mod nonobstructive disease otherwise. 2. HTN 3. Hyperlipidemia, can only tolerate low dose statins 4. Mild AS/AI by echo 02/04/14  5. 1st degree AVB/RBBB - no bradycardia noted  Cardiac rehab this AM. If he does well, OK to DC home today.  Signed, Melina Copa PA-C Patient seen and examined and history reviewed. Agree with above findings and plan. Doing well post PCI. No chest pain. Ecg is stable. No radial site hematoma. Stable for DC today. Follow up with Dr. Mare Ferrari. Remain on DAPT indefinitely.  Gabriel [name], Gabriel [name] 02/06/2014 9:16 AM

## 2014-02-06 NOTE — Discharge Summary (Signed)
Discharge Summary   Patient ID: Gabriel [name] MRN: [id number], DOB/AGE: [birth year] 79 y.o. Admit date: 02/05/2014 D/C date:     02/06/2014  Primary Care Provider: Darlin Coco, MD Primary Cardiologist: Mare Ferrari  Primary Discharge Diagnoses:  1. CAD with recent abnormal stress test/unstable angina - cath 02/05/14 with DES to LAD, mild-mod nonobstructive disease otherwise - history:  s/p PTCA 1995, MI in 2003 s/p stenting to LAD 2003, s/p stent to Cx 2003, prior rotablator to PTCA vessel 2. HTN  3. Hyperlipidemia, can only tolerate low dose statins  4. Mild AS/AI by echo 02/04/14  5. 1st degree AVB/RBBB - no bradycardia noted 6. Ischemic cardiomyopathy EF  40-45% by echo, 40-45% by cath 02/05/14 (similar to 2010)  Secondary Discharge Diagnoses:  1. Neuropathy 2. H/o kidney stone 3. Vertigo s/p Epley maneuver, vestibular rehab 4. OSA with upper airway resistance 5. GERD   Hospital Course: Gabriel [name] is a 79 y/o M with history of CAD outlined above, mild AS/AI by recent echo, HLD, OSA who presented to [facility] with complaints of chest burning with exertion, including sexual intercourse. This was relieved with rest. Nuclear stress test was performed which demonstrated an anterior, apical reversible defect with on the images. Recent echo 02/04/14 showed mild LVH with focal basal hypertrophy, EF 40-45% with moderate hypokinesis of the midanteroseptal myocardium, increased relative contribution of atrial contraction to ventricular filling, and mild AS/AI. Cardiac cath was recommended. He came in for this procedure yesterday which demonstrated: 1. Normal left main coronary artery. 2. 90% focal stenosis in the mid left anterior descending artery. This was successfully treated with a 2.25 x 12 promus drug-eluting stent, postdilated to 2.6 mm in diameter. Previously placed stent in the more proximal portion of the LAD is widely patent. 3. Patent stents in the mid left circumflex artery. Mild  to moderate disease in the circumflex system. 4. Mild to moderate disease right coronary artery system. The distal RCA stent has mild in-stent restenosis. 5. Mildly decreased left ventricular systolic function. LVEDP 19 mmHg. Ejection fraction 40-45%. Dr. Irish Lack notes that the angiogram did not reveal much calcium in the LAD. It was difficult to get devices to the mid LAD due to tortuosity and likely calcification. DAPT indefinitely was recommended. He was observed overnight and did well. There have been no medicine changes. Of note he takes sildenafil PRN ED - He was instructed not to take Sildenafil within 24 hrs of taking NTG, and vice versa. Dr. Martinique has seen and examined the patient today and feels he is stable for discharge. Due to summer appointment availability, his post-hospital followup appointment will be at our [facility] office. He should then go back to following up with Dr. Mare Ferrari as usual afterwards.   Discharge Vitals: Blood pressure 132/74, pulse 70, temperature 98.6 F (37 C), temperature source Oral, resp. rate 20, height 6\' 1"  (1.854 m), weight 207 lb 3.7 oz (94 kg), SpO2 96.00%.  Labs: Lab Results  Component Value Date   WBC 6.6 02/06/2014   HGB 12.8* 02/06/2014   HCT 36.7* 02/06/2014   MCV 101.4* 02/06/2014   PLT 174 02/06/2014    Recent Labs Lab 02/06/14 0325  NA 143  K 4.2  CL 105  CO2 24  BUN 12  CREATININE 1.03  CALCIUM 8.4  GLUCOSE 105*    Lab Results  Component Value Date   CHOL 230* 01/13/2014   HDL 40.80 01/13/2014   LDLCALC 111* 01/13/2014   TRIG 393.0* 01/13/2014  No results found for this basename: [id number]    Diagnostic Studies/Procedures   Cardiac Cath 02/05/14 PROCEDURE: Left heart catheterization with selective coronary angiography, left ventriculogram. PCI LAD  INDICATIONS: High-risk abnormal stress test, angina  The risks, benefits, and details of the procedure were explained to the patient. The patient verbalized understanding and wanted to  proceed. Informed written consent was obtained.  PROCEDURE TECHNIQUE: After Xylocaine anesthesia a 31F slender sheath was placed in the right radial artery with a single anterior needle wall stick. IV heparin was given. Right coronary angiography was done using a Judkins R4 guide catheter. Left coronary angiography was done using a Judkins L3.5 guide catheter. Left ventriculography was done using a pigtail catheter. The intervention was performed. Please see below for details. A TR band was used for hemostasis.  CONTRAST: Total of 170 cc.  COMPLICATIONS: None.  HEMODYNAMICS: Aortic pressure was 129/67; LV pressure was 141/11; LVEDP 19. There was no gradient between the left ventricle and aorta.  ANGIOGRAPHIC DATA: The left main coronary artery is a short vessel but widely patent.  The left anterior descending artery is a large vessel which wraps around the apex. There is a large first diagonal which is patent. The second diagonal is patent. In the mid LAD, there is a widely patent stent. This stent covers the origin of the second diagonal. After a more tortuous segment in the mid LAD, there is a focal 90% stenosis with an apparent mild, spontaneous dissection at the plaque. The mid to distal LAD is widely patent. There is mild calcification of this area at the focal stenosis in the mid LAD.  The left circumflex artery is a large vessel extending across the lateral wall. There is mild, proximal disease. The stents in the mid vessel has mild in-stent restenosis. There is a small first obtuse marginal which is patent. The second obtuse marginal which is a first significant branch has mild disease proximally. This is a prior angioplasty site. The continuation of the circumflex after this branch has moderate disease. The third obtuse marginal is widely patent.  The right coronary artery is a large dominant vessel. In the mid vessel, there is moderate disease. The stent in the distal RCA has mild, in-stent  restenosis. The posterior lateral artery is medium-sized and patent. The posterior descending artery is widely patent.  LEFT VENTRICULOGRAM: Left ventricular angiogram was done in the 30 RAO projection and revealed mildly decreased systolic function globally with an estimated ejection fraction of 40-45 %. LVEDP was 19 mmHg.  PCI NARRATIVE: A CLS 3.0 guiding catheter was used to engage the left main. It was selected in the LAD. Additional heparin was given for any coagulation. ACT was used to check that the heparin was therapeutic. A pro-water wire was placed across the area disease in the mid LAD. It was noted that the pro-water wire was difficult to advance, likely due to calcification in the mid vessel. Initially, a 2.5 x 12 balloon was advanced but would not cross the tortuosity in the mid LAD. A 1.5 balloon was then advanced but would not cross the lesion. A 1.5 balloon was inflated in the hopes that it would help create a tract to cross the lesion. This was unsuccessful. A 1.2 x 12 balloon was then advanced successfully crossed the lesion. Was inflated to high pressure. A BMW wire was placed as a buddy wire in the LAD. The 1.5 balloon was then placed across the lesion and inflated to high pressure. A 2.0 x  12 balloon was then used to further predilate, inflated to 14 atmospheres. A 2.25 x 12 balloon was then used to predilate. The balloons would inflate fully, but there was significant recoil after the balloon was deflated. A 2.25 x 12 Promus drug-eluting stent was then advanced to the area disease and deployed at 16 atmospheres. There is no residual stenosis, a 2.5 x 8 balloon was used to post dilate the stented segment. Several doses of nitroglycerin were given during the procedure. There was an excellent angiographic result.  IMPRESSIONS:  6. Normal left main coronary artery. 7. 90% focal stenosis in the mid left anterior descending artery. This was successfully treated with a 2.25 x 12 promus  drug-eluting stent, postdilated to 2.6 mm in diameter. Previously placed stent in the more proximal portion of the LAD is widely patent. 8. Patent stents in the mid left circumflex artery. Mild to moderate disease in the circumflex system. 9. Mild to moderate disease right coronary artery system. The distal RCA stent has mild in-stent restenosis. 10. Mildly decreased left ventricular systolic function. LVEDP 19 mmHg. Ejection fraction 40-45%. RECOMMENDATION: Continue dual antiplatelet therapy indefinitely. The angiogram does not reveal much calcium in the LAD. It was difficult to get devices to the mid LAD due to tortuosity and likely calcification. He'll be watched overnight. Consider cardiac rehabilitation. If there no complications, anticipate discharge tomorrow. Followup with Dr. Mare Ferrari.    Discharge Medications   Current Discharge Medication List    CONTINUE these medications which have NOT CHANGED   Details  amLODipine (NORVASC) 5 MG tablet Take 5 mg by mouth daily.    aspirin EC 81 MG tablet Take 81 mg by mouth daily.    atorvastatin (LIPITOR) 10 MG tablet Take 1 tablet (10 mg total) by mouth daily.    Associated Diagnoses: Aortic valve sclerosis; Benign hypertensive heart disease without heart failure; Dyslipidemia; Chest pain    clopidogrel (PLAVIX) 75 MG tablet Take 75 mg by mouth daily.    ezetimibe (ZETIA) 10 MG tablet Take 1 tablet (10 mg total) by mouth daily.     gabapentin (NEURONTIN) 800 MG tablet Take 1 tablet (800 mg total) by mouth 3 (three) times daily.    Associated Diagnoses: Neuropathy; OSA on CPAP    lisinopril (PRINIVIL,ZESTRIL) 20 MG tablet Take 20 mg by mouth daily.    loratadine (CLARITIN) 10 MG tablet Take 10 mg by mouth daily as needed. For allergies.   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    Multiple Vitamins-Minerals (MULTIVITAMINS THER. W/MINERALS) TABS Take 1 tablet by mouth daily.    nitroGLYCERIN (NITROSTAT) 0.4 MG SL tablet Place 0.4 mg under the tongue every 5 (five) minutes as needed for chest pain.    sildenafil (REVATIO) 20 MG tablet Take 40 mg by mouth daily as needed (erectile dysfunction).     temazepam (RESTORIL) 30 MG capsule Take 1 capsule (30 mg total) by mouth at bedtime as needed for sleep.    Associated Diagnoses: Insomnia        Disposition   The patient will be discharged in stable condition to home. Discharge Instructions   Diet - low sodium heart healthy    Complete by:  As directed      Increase activity slowly    Complete by:  As directed  No driving for 2 days. No lifting over 5 lbs for 1 week. No sexual activity for 1 week. Keep procedure site clean & dry. If you notice increased pain, swelling, bleeding or pus, call/return!  You may shower, but no soaking baths/hot tubs/pools for 1 week.   Do not take Nitroglycerin if you have taken sildenafil (Viagra/Revatio) within the last 24 hours. Likewise, do not take sildenafil (Viagra/Revatio) if you have taken any Nitrogylcerin within the last 24 hours. This can lead to a dangerous drop in blood pressure.          Follow-up Information   Follow up with [facility] R, NP On 02/21/2014. (At 2:30pm. Due to summer appointment availability, your post-hospital followup appointment will be at our [facility] office - different location, same cardiology group. You will then go back to following up with Dr. Mare Ferrari as usual afterwards.)    Specialty:  Cardiology   Contact information:   [address] [place] [address] [phone]         Duration of Discharge Encounter: Greater than 30 minutes including physician and PA time.  Signed, Melina Copa PA-C 02/06/2014, 8:51  AM  Patient seen and examined and history reviewed. Agree with above findings and plan. See earlier rounding note.  [name], [place] 02/06/2014 9:18 AM

## 2014-02-06 NOTE — Progress Notes (Signed)
CARDIAC REHAB PHASE I   PRE:  Rate/Rhythm: 76 SR    BP: sitting 138/76    SaO2:   MODE:  Ambulation: 1000 ft   POST:  Rate/Rhythm: 101 ST    BP: sitting 158/81     SaO2:   Tolerated very well. Feels good. Ed completed with pt and wife. Interested in [place] and will send referral to [facility]. [phone]   Darrick Meigs CES, ACSM 02/06/2014 8:55 AM

## 2014-02-07 ENCOUNTER — Other Ambulatory Visit: Payer: Self-pay | Admitting: Cardiology

## 2014-02-13 ENCOUNTER — Other Ambulatory Visit: Payer: Self-pay | Admitting: Cardiology

## 2014-02-21 ENCOUNTER — Encounter: Payer: Self-pay | Admitting: Cardiology

## 2014-02-21 ENCOUNTER — Ambulatory Visit (INDEPENDENT_AMBULATORY_CARE_PROVIDER_SITE_OTHER): Payer: Medicare Other | Admitting: Cardiology

## 2014-02-21 VITALS — BP 130/84 | HR 66 | Ht 73.0 in | Wt 204.0 lb

## 2014-02-21 DIAGNOSIS — I2510 Atherosclerotic heart disease of native coronary artery without angina pectoris: Secondary | ICD-10-CM | POA: Diagnosis not present

## 2014-02-21 DIAGNOSIS — E785 Hyperlipidemia, unspecified: Secondary | ICD-10-CM

## 2014-02-21 DIAGNOSIS — G4733 Obstructive sleep apnea (adult) (pediatric): Secondary | ICD-10-CM

## 2014-02-21 DIAGNOSIS — I440 Atrioventricular block, first degree: Secondary | ICD-10-CM

## 2014-02-21 DIAGNOSIS — Z9989 Dependence on other enabling machines and devices: Secondary | ICD-10-CM

## 2014-02-21 NOTE — Progress Notes (Signed)
02/27/2014   PCP: Darlin Coco, MD   Chief Complaint  Patient presents with  . Hospitalization Follow-up    No complaints.    Primary Cardiologist: Dr. Mare Ferrari  HPI:  79 y/o M with history of CAD outlined below, mild AS/AI by recent echo, HLD, OSA who presented to [facility] with complaints of chest burning with exertion, including sexual intercourse. This was relieved with rest. Nuclear stress test was performed which demonstrated an anterior, apical reversible defect with on the images. Recent echo 02/04/14 showed mild LVH with focal basal hypertrophy, EF 40-45% with moderate hypokinesis of the midanteroseptal myocardium, increased relative contribution of atrial contraction to ventricular filling, and mild AS/AI. Cardiac cath was recommended.   90% focal stenosis in the mid left anterior descending artery. This was successfully treated with a 2.25 x 12 promus drug-eluting stent, postdilated to 2.6 mm in diameter. Previously placed stent in the more proximal portion of the LAD is widely patent. Previous stents in mLCX and RCA were patent or with mild in stent stenosis.   Pt is here for follow up.  No chest pain and no SOB.  He feel well.  Is compiant with his meds.  Eating a heart health diet.    Allergies  Allergen Reactions  . Lipitor [Atorvastatin Calcium] Other (See Comments)    Muscle pain--Pt stated he can tolerate a low dose  . Mevacor [Lovastatin] Other (See Comments)    "makes me feel loopy"    Current Outpatient Prescriptions  Medication Sig Dispense Refill  . amLODipine (NORVASC) 5 MG tablet Take 5 mg by mouth daily.      Marland Kitchen aspirin EC 81 MG tablet Take 81 mg by mouth daily.      Marland Kitchen atorvastatin (LIPITOR) 10 MG tablet Take 1 tablet (10 mg total) by mouth daily.  30 tablet  3  . clopidogrel (PLAVIX) 75 MG tablet Take 75 mg by mouth daily.      Marland Kitchen ezetimibe (ZETIA) 10 MG tablet Take 1 tablet (10 mg total) by mouth daily.  90 tablet  3  .  gabapentin (NEURONTIN) 800 MG tablet Take 1 tablet (800 mg total) by mouth 3 (three) times daily.  270 tablet  5  . lisinopril (PRINIVIL,ZESTRIL) 20 MG tablet Take 20 mg by mouth daily.      Marland Kitchen loratadine (CLARITIN) 10 MG tablet Take 10 mg by mouth daily as needed. For allergies.      . metoprolol succinate (TOPROL-XL) 25 MG 24 hr tablet Take 25 mg by mouth daily.      . Multiple Vitamins-Minerals (MULTIVITAMINS THER. W/MINERALS) TABS Take 1 tablet by mouth daily.      . nitroGLYCERIN (NITROSTAT) 0.4 MG SL tablet Place 0.4 mg under the tongue every 5 (five) minutes as needed for chest pain.      . sildenafil (REVATIO) 20 MG tablet Take 40 mg by mouth daily as needed (erectile dysfunction).       . temazepam (RESTORIL) 30 MG capsule Take 1 capsule (30 mg total) by mouth at bedtime as needed for sleep.  30 capsule  5   No current facility-administered medications for this visit.    Past Medical History  Diagnosis Date  . CAD (coronary artery disease)     a. s/p PTCA 1995. b. MI in 2003 s/p stenting to LAD 2003, s/p stent to Cx 2003. c. prior rotablator to PTCA vessel. d. Abnl nuc 01/2014: s/p DES to LAD,  mild-mod nonobstructive disease otherwise.  Marland Kitchen Aortic valve disease     a. Mild AS, AI by echo 01/2014.  Marland Kitchen Hypertension   . Neuropathy   . Hyperlipidemia     a. Can only tolerate low dose statins.  . Kidney stone   . Insomnia   . Vertigo     Had Epley maneuver, vestibular rehab.  . OSA (obstructive sleep apnea)     with upper airway resistancy  . GERD (gastroesophageal reflux disease)   . 1St degree AV block   . RBBB   . Ischemic cardiomyopathy     a. EF 40-45% by echo 02/04/14, 40-45% by cath 02/05/14 (similar to 2010).    Past Surgical History  Procedure Laterality Date  . Coronary stent placement  2003    LCX  . Cholecystectomy  2009  . Meniscus removal- knee Right   . Shoulder open rotator cuff repair  1990  . Hernia repair  1994    double  . Cataract extraction, bilateral  2004   . Kidney stone surgery  10/2005  . Lithotripsy  10/2005  . Cardiac catheterization  2003    Following MI  . Coronary angioplasty  2003    LAD  . Left heart cath  02/05/2014    VOJ:[id number]:WE colds or fevers, no significant weight changes Skin:no rashes or ulcers HEENT:no blurred vision, no congestion CV:see HPI PUL:see HPI GI:no diarrhea constipation or melena, no indigestion GU:no hematuria, no dysuria MS:no joint pain, no claudication Neuro:no syncope, no lightheadedness Endo:no diabetes, no thyroid disease  Wt Readings from Last 3 Encounters:  02/21/14 204 lb (92.534 kg)  02/06/14 207 lb 3.7 oz (94 kg)  02/06/14 207 lb 3.7 oz (94 kg)    PHYSICAL EXAM BP 130/84  Pulse 66  Ht 6\' 1"  (1.854 m)  Wt 204 lb (92.534 kg)  BMI 26.92 kg/m2 General:Pleasant affect, NAD Skin:Warm and dry, brisk capillary refill HEENT:normocephalic, sclera clear, mucus membranes moist Neck:supple, no JVD, no bruits  Heart:S1S2 RRR without murmur, gallup, rub or click Lungs:clear without rales, rhonchi, or wheezes XHB:ZJIR, non tender, + BS, do not palpate liver spleen or masses Ext:no lower ext edema, 2+ pedal pulses, 2+ radial pulses- cath site without hematoma Neuro:alert and oriented, MAE, follows commands, + facial symmetry EKG:SR with 1st degree AV block RBBB, LAFB   ASSESSMENT AND PLAN CAD (coronary artery disease) a. s/p PTCA 1995. b. MI in 2003 s/p stenting to LAD 2003, s/p stent to Cx 2003. c. prior rotablator to PTCA vessel. d. Abnl nuc 01/2014: s/p DES to LAD, mild-mod nonobstructive disease otherwise. No chest pain, no complaints today.  1St degree AV block chronic  OSA on CPAP stable  Hyperlipidemia Lipid Panel     Component Value Date/Time   CHOL 230* 01/13/2014 0810   TRIG 393.0* 01/13/2014 0810   HDL 40.80 01/13/2014 0810   CHOLHDL 6 01/13/2014 0810   VLDL 78.6* 01/13/2014 0810   LDLCALC 111* 01/13/2014 0810   Can only tolerate low dose statins

## 2014-02-21 NOTE — Patient Instructions (Signed)
Cecilie Kicks, NP has made no changes in your current medications or treatment.  Your physician recommends that you schedule a follow-up appointment in 6-8 weeks with Dr Mare Ferrari.

## 2014-02-27 NOTE — Assessment & Plan Note (Signed)
chronic

## 2014-02-27 NOTE — Assessment & Plan Note (Signed)
Lipid Panel     Component Value Date/Time   CHOL 230* 01/13/2014 0810   TRIG 393.0* 01/13/2014 0810   HDL 40.80 01/13/2014 0810   CHOLHDL 6 01/13/2014 0810   VLDL 78.6* 01/13/2014 0810   LDLCALC 111* 01/13/2014 0810   Can only tolerate low dose statins

## 2014-02-27 NOTE — Assessment & Plan Note (Signed)
stable °

## 2014-02-27 NOTE — Assessment & Plan Note (Signed)
a. s/p PTCA 1995. b. MI in 2003 s/p stenting to LAD 2003, s/p stent to Cx 2003. c. prior rotablator to PTCA vessel. d. Abnl nuc 01/2014: s/p DES to LAD, mild-mod nonobstructive disease otherwise. No chest pain, no complaints today.

## 2014-03-13 ENCOUNTER — Other Ambulatory Visit: Payer: Self-pay | Admitting: Cardiology

## 2014-03-26 ENCOUNTER — Encounter: Payer: Self-pay | Admitting: Cardiology

## 2014-03-26 ENCOUNTER — Ambulatory Visit (INDEPENDENT_AMBULATORY_CARE_PROVIDER_SITE_OTHER): Payer: Medicare Other | Admitting: Cardiology

## 2014-03-26 VITALS — BP 110/80 | HR 70 | Ht 73.0 in | Wt 206.4 lb

## 2014-03-26 DIAGNOSIS — E7800 Pure hypercholesterolemia, unspecified: Secondary | ICD-10-CM

## 2014-03-26 DIAGNOSIS — I25119 Atherosclerotic heart disease of native coronary artery with unspecified angina pectoris: Secondary | ICD-10-CM

## 2014-03-26 DIAGNOSIS — I2510 Atherosclerotic heart disease of native coronary artery without angina pectoris: Secondary | ICD-10-CM | POA: Diagnosis not present

## 2014-03-26 DIAGNOSIS — I2589 Other forms of chronic ischemic heart disease: Secondary | ICD-10-CM | POA: Diagnosis not present

## 2014-03-26 DIAGNOSIS — I358 Other nonrheumatic aortic valve disorders: Secondary | ICD-10-CM

## 2014-03-26 DIAGNOSIS — I209 Angina pectoris, unspecified: Secondary | ICD-10-CM

## 2014-03-26 DIAGNOSIS — I359 Nonrheumatic aortic valve disorder, unspecified: Secondary | ICD-10-CM | POA: Diagnosis not present

## 2014-03-26 DIAGNOSIS — I255 Ischemic cardiomyopathy: Secondary | ICD-10-CM

## 2014-03-26 DIAGNOSIS — I119 Hypertensive heart disease without heart failure: Secondary | ICD-10-CM

## 2014-03-26 NOTE — Assessment & Plan Note (Signed)
Patient has not had any headaches.  No palpitations.  No dizziness or syncope

## 2014-03-26 NOTE — Assessment & Plan Note (Signed)
Patient has done well since his cardiac catheterization and PCI.  He has had no recurrent angina.  He is on dual antiplatelet therapy with aspirin and Plavix.

## 2014-03-26 NOTE — Progress Notes (Signed)
Gabriel [name] Date of Birth:  12-Nov-1934 [place] [address] [place], [place]  [address] [phone]        Fax   [phone]   History of Present Illness: This pleasant 79 year old gentleman is seen for a scheduled followup office visit. The patient has a history of ischemic heart disease. He had a myocardial infarction in 2003 treated with PCI to his LAD. He also had a stent to his circumflex in 2003. 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 He has a history of hypercholesterolemia and essential hypertension. Patient also has a past history of kidney stones treated with lithotripsy. He tries to drink a lot of water to keep the kidney stones from reforming.  The patient presented to [facility] with complaints of chest burning with exertion, including sexual intercourse. This was relieved with rest. Nuclear stress test was performed which demonstrated an anterior, apical reversible defect with on the images. Recent echo 02/04/14 showed mild LVH with focal basal hypertrophy, EF 40-45% with moderate hypokinesis of the midanteroseptal myocardium, increased relative contribution of atrial contraction to ventricular filling, and mild AS/AI. Cardiac cath was recommended.  Dr. Irish Lack performed the cardiac catheterization. 90% focal stenosis in the mid left anterior descending artery. This was successfully treated with a 2.25 x 12 promus drug-eluting stent, postdilated to 2.6 mm in diameter. Previously placed stent in the more proximal portion of the LAD is widely patent. Previous stents in mLCX and RCA were patent or with mild in stent stenosis.    Current Outpatient Prescriptions  Medication Sig Dispense Refill  . amLODipine (NORVASC) 5 MG tablet TAKE 1 TABLET (5 MG TOTAL) BY MOUTH DAILY.  90 tablet  0  . aspirin EC 81 MG tablet Take 81 mg by mouth daily.      Marland Kitchen atorvastatin (LIPITOR) 10  MG tablet Take 1 tablet (10 mg total) by mouth daily.  30 tablet  3  . clopidogrel (PLAVIX) 75 MG tablet Take 75 mg by mouth daily.      Marland Kitchen ezetimibe (ZETIA) 10 MG tablet Take 1 tablet (10 mg total) by mouth daily.  90 tablet  3  . gabapentin (NEURONTIN) 800 MG tablet Take 1 tablet (800 mg total) by mouth 3 (three) times daily.  270 tablet  5  . lisinopril (PRINIVIL,ZESTRIL) 20 MG tablet Take 20 mg by mouth daily.      Marland Kitchen loratadine (CLARITIN) 10 MG tablet Take 10 mg by mouth daily as needed. For allergies.      . metoprolol succinate (TOPROL-XL) 25 MG 24 hr tablet Take 25 mg by mouth daily.      . Multiple Vitamins-Minerals (MULTIVITAMINS THER. W/MINERALS) TABS Take 1 tablet by mouth daily.      . nitroGLYCERIN (NITROSTAT) 0.4 MG SL tablet Place 0.4 mg under the tongue every 5 (five) minutes as needed for chest pain.      . sildenafil (REVATIO) 20 MG tablet Take 40 mg by mouth daily as needed (erectile dysfunction).       . temazepam (RESTORIL) 30 MG capsule Take 1 capsule (30 mg total) by mouth at bedtime as needed for sleep.  30 capsule  5   No current facility-administered medications for this visit.    Allergies  Allergen Reactions  . Lipitor [Atorvastatin Calcium] Other (See Comments)    Muscle pain--Pt stated he can tolerate a low dose  . Mevacor [Lovastatin]  Other (See Comments)    "makes me feel loopy"    Patient Active Problem List   Diagnosis Date Noted  . Hypercholesterolemia 10/22/2010    Priority: High  . CAD (coronary artery disease)   . Aortic valve disease   . Hypertension   . 1St degree AV block   . Ischemic cardiomyopathy   . GERD (gastroesophageal reflux disease)   . OSA (obstructive sleep apnea)   . Vertigo   . Kidney stone   . Hyperlipidemia   . Nonspecific abnormal unspecified cardiovascular function study 02/03/2014  . OSA on CPAP 09/18/2013  . Sleep apnea 03/20/2012  . Neuropathy 02/21/2011  . Benign hypertensive heart disease without heart failure      History  Smoking status  . Former Smoker  Smokeless tobacco  . Never Used    Comment: 1968    History  Alcohol Use  . 0.0 oz/week    Comment: rarely    Family History  Problem Relation Age of Onset  . Heart Problems Mother   . Parkinson's disease Father 43  . Other Sister     Muscular degeneration    Review of Systems: Constitutional: no fever chills diaphoresis or fatigue or change in weight.  Head and neck: no hearing loss, no epistaxis, no photophobia or visual disturbance. Respiratory: No cough, shortness of breath or wheezing. Cardiovascular: No chest pain peripheral edema, palpitations. Gastrointestinal: No abdominal distention, no abdominal pain, no change in bowel habits hematochezia or melena. Genitourinary: No dysuria, no frequency, no urgency, no nocturia. Musculoskeletal:No arthralgias, no back pain, no gait disturbance or myalgias. Neurological: No dizziness, no headaches, no numbness, no seizures, no syncope, no weakness, no tremors. Hematologic: No lymphadenopathy, no easy bruising. Psychiatric: No confusion, no hallucinations, no sleep disturbance.    Physical Exam: Filed Vitals:   03/26/14 1346  BP: 110/80  Pulse: 70  The patient appears to be in no distress.  Head and neck exam reveals that the pupils are equal and reactive.  The extraocular movements are full.  There is no scleral icterus.  Mouth and pharynx are benign.  No lymphadenopathy.  No carotid bruits.  The jugular venous pressure is normal.  Thyroid is not enlarged or tender.  Chest is clear to percussion and auscultation.  No rales or rhonchi.  Expansion of the chest is symmetrical.  Heart reveals no abnormal lift or heave.  First and second heart sounds are normal.  There is grade 2/6 systolic murmur at the base.  No diastolic murmur.  The abdomen is soft and nontender.  Bowel sounds are normoactive.  There is no hepatosplenomegaly or mass.  There are no abdominal  bruits.  Extremities reveal no phlebitis or edema.  Pedal pulses are good.  There is no cyanosis or clubbing.  Neurologic exam is normal strength and no lateralizing weakness.  No sensory deficits.  Integument reveals no rash    Assessment / Plan: 1.  Ischemic heart disease with cardial infarction in 2003 with PCI to his LAD and circumflex at that time.  Also a prior stent to his right coronary artery.  In July 2015 the patient had drug-eluting stent placed in the mid LAD , distal to the previously placed stent which is still widely patent. 2. ischemic cardiomyopathy with ejection fraction 40-45% 3. Hypercholesterolemia 4. aortic valve sclerosis 5.  Benign hypertensive heart disease without heart failure  Plan: Continue current therapy.  Recheck in 2 months for office visit EKG lipid panel hepatic function panel and basal  metabolic panel

## 2014-03-26 NOTE — Patient Instructions (Signed)
Your physician recommends that you continue on your current medications as directed. Please refer to the Current Medication list given to you today.  Your physician recommends that you schedule a follow-up appointment in: 2 month [id number]

## 2014-03-26 NOTE — Assessment & Plan Note (Signed)
The patient is back on a low dose of atorvastatin and so far he is tolerating it without side effects.  He is also on ezetimibe.  He has had a mild alone was not sufficient to bring down his LDL.  We will plan to recheck his lipids in 2 months

## 2014-04-15 ENCOUNTER — Other Ambulatory Visit: Payer: Self-pay | Admitting: Cardiology

## 2014-05-07 ENCOUNTER — Other Ambulatory Visit: Payer: Self-pay | Admitting: Cardiology

## 2014-05-24 ENCOUNTER — Other Ambulatory Visit: Payer: Self-pay | Admitting: Cardiology

## 2014-05-26 ENCOUNTER — Other Ambulatory Visit (INDEPENDENT_AMBULATORY_CARE_PROVIDER_SITE_OTHER): Payer: Medicare Other | Admitting: *Deleted

## 2014-05-26 DIAGNOSIS — I119 Hypertensive heart disease without heart failure: Secondary | ICD-10-CM | POA: Diagnosis not present

## 2014-05-26 DIAGNOSIS — R079 Chest pain, unspecified: Secondary | ICD-10-CM

## 2014-05-26 DIAGNOSIS — E785 Hyperlipidemia, unspecified: Secondary | ICD-10-CM

## 2014-05-26 DIAGNOSIS — I358 Other nonrheumatic aortic valve disorders: Secondary | ICD-10-CM | POA: Diagnosis not present

## 2014-05-26 LAB — LIPID PANEL
CHOLESTEROL: 140 mg/dL (ref 0–200)
HDL: 42.8 mg/dL (ref >39.00)
LDL Cholesterol: 60 mg/dL (ref 0–99)
NonHDL: 97.2
TRIGLYCERIDES: 185 mg/dL — AB (ref 0.0–149.0)
Total CHOL/HDL Ratio: 3
VLDL: 37 mg/dL (ref 0.0–40.0)

## 2014-05-26 LAB — BASIC METABOLIC PANEL
BUN: 14 mg/dL (ref 6–23)
CHLORIDE: 102 meq/L (ref 96–112)
CO2: 27 mEq/L (ref 19–32)
CREATININE: 1.2 mg/dL (ref 0.4–1.5)
Calcium: 8.8 mg/dL (ref 8.4–10.5)
GFR: 60.52 mL/min (ref >60.00)
GLUCOSE: 103 mg/dL — AB (ref 70–99)
Potassium: 4.2 mEq/L (ref 3.5–5.1)
Sodium: 137 mEq/L (ref 135–145)

## 2014-05-26 LAB — HEPATIC FUNCTION PANEL
ALBUMIN: 3.4 g/dL — AB (ref 3.5–5.2)
ALT: 21 U/L (ref 0–53)
AST: 24 U/L (ref 0–37)
Alkaline Phosphatase: 46 U/L (ref 39–117)
BILIRUBIN TOTAL: 0.9 mg/dL (ref 0.2–1.2)
Bilirubin, Direct: 0 mg/dL (ref 0.0–0.3)
TOTAL PROTEIN: 7.1 g/dL (ref 6.0–8.3)

## 2014-05-27 ENCOUNTER — Telehealth: Payer: Self-pay | Admitting: Cardiology

## 2014-05-27 MED ORDER — AZITHROMYCIN 250 MG PO TABS: ORAL_TABLET | ORAL | Status: DC

## 2014-05-27 NOTE — Telephone Encounter (Signed)
New message    Patient would like a zpack called in

## 2014-05-27 NOTE — Telephone Encounter (Signed)
Spoke with patient and he has same head congestion and cough had, requesting Zpak. Discussed with  Dr. Mare Ferrari and ok to Rx. advised patient and he will keep ov next week

## 2014-05-27 NOTE — Progress Notes (Signed)
Quick Note:  Please make copy of labs for patient visit. ______ 

## 2014-06-02 ENCOUNTER — Ambulatory Visit (INDEPENDENT_AMBULATORY_CARE_PROVIDER_SITE_OTHER): Payer: Medicare Other | Admitting: Cardiology

## 2014-06-02 ENCOUNTER — Encounter: Payer: Self-pay | Admitting: Cardiology

## 2014-06-02 VITALS — BP 122/74 | HR 72 | Ht 73.0 in | Wt 204.0 lb

## 2014-06-02 DIAGNOSIS — E7800 Pure hypercholesterolemia, unspecified: Secondary | ICD-10-CM

## 2014-06-02 DIAGNOSIS — I119 Hypertensive heart disease without heart failure: Secondary | ICD-10-CM

## 2014-06-02 DIAGNOSIS — I2510 Atherosclerotic heart disease of native coronary artery without angina pectoris: Secondary | ICD-10-CM | POA: Diagnosis not present

## 2014-06-02 DIAGNOSIS — I359 Nonrheumatic aortic valve disorder, unspecified: Secondary | ICD-10-CM | POA: Diagnosis not present

## 2014-06-02 DIAGNOSIS — I10 Essential (primary) hypertension: Secondary | ICD-10-CM | POA: Diagnosis not present

## 2014-06-02 DIAGNOSIS — I25119 Atherosclerotic heart disease of native coronary artery with unspecified angina pectoris: Secondary | ICD-10-CM

## 2014-06-02 NOTE — Assessment & Plan Note (Signed)
The patient is not having any symptoms from his aortic stenosis

## 2014-06-02 NOTE — Assessment & Plan Note (Signed)
The patient is on Lipitor and ezetimibe.  His lipids are much improved.  He is not having any myalgias

## 2014-06-02 NOTE — Assessment & Plan Note (Signed)
Blood pressure has been remaining stable on current therapy.  The patient has not been having any symptoms of CHF.  He has not been doing as much walking because of the bad weather outside.  He plans to do more walking inside at a church or [employer]

## 2014-06-02 NOTE — Progress Notes (Signed)
Gabriel [name] Date of Birth:  Aug 26, 1934 [place] [address] [place], [place]  [address] [phone]        Fax   [phone]   History of Present Illness: This pleasant 79 year old gentleman is seen for a scheduled followup office visit. The patient has a history of ischemic heart disease. He had a myocardial infarction in 2003 treated with PCI to his LAD. He also had a stent to his circumflex in 2003. 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 He has a history of hypercholesterolemia and essential hypertension. Patient also has a past history of kidney stones treated with lithotripsy. He tries to drink a lot of water to keep the kidney stones from reforming.  The patient presented to [facility] with complaints of chest burning with exertion, including sexual intercourse. This was relieved with rest. Nuclear stress test was performed which demonstrated an anterior, apical reversible defect with on the images. Recent echo 02/04/14 showed mild LVH with focal basal hypertrophy, EF 40-45% with moderate hypokinesis of the midanteroseptal myocardium, increased relative contribution of atrial contraction to ventricular filling, and mild AS/AI. Cardiac cath was recommended.  Dr. Irish Lack performed the cardiac catheterization. 90% focal stenosis in the mid left anterior descending artery. This was successfully treated with a 2.25 x 12 promus drug-eluting stent, postdilated to 2.6 mm in diameter. Previously placed stent in the more proximal portion of the LAD is widely patent. Previous stents in mLCX and RCA were patent or with mild in stent stenosis. Since his cardiac catheterization he has been doing well.   Current Outpatient Prescriptions  Medication Sig Dispense Refill  . amLODipine (NORVASC) 5 MG tablet TAKE 1 TABLET (5 MG TOTAL) BY MOUTH DAILY. 90 tablet 0  . aspirin EC 81 MG tablet Take  81 mg by mouth daily.    Marland Kitchen atorvastatin (LIPITOR) 10 MG tablet TAKE 1 TABLET (10 MG TOTAL) BY MOUTH DAILY. 30 tablet 6  . azithromycin (ZITHROMAX) 250 MG tablet 2 tablets on day 1 and then 1 daily until finished 6 each 0  . clopidogrel (PLAVIX) 75 MG tablet Take 75 mg by mouth daily.    Marland Kitchen ezetimibe (ZETIA) 10 MG tablet Take 1 tablet (10 mg total) by mouth daily. 90 tablet 3  . fluticasone (FLONASE) 50 MCG/ACT nasal spray   11  . gabapentin (NEURONTIN) 800 MG tablet Take 1 tablet (800 mg total) by mouth 3 (three) times daily. 270 tablet 5  . lisinopril (PRINIVIL,ZESTRIL) 20 MG tablet Take 20 mg by mouth daily.    Marland Kitchen loratadine (CLARITIN) 10 MG tablet Take 10 mg by mouth daily as needed. For allergies.    . metoprolol succinate (TOPROL-XL) 25 MG 24 hr tablet Take 25 mg by mouth daily.    . Multiple Vitamins-Minerals (MULTIVITAMINS THER. W/MINERALS) TABS Take 1 tablet by mouth daily.    . nitroGLYCERIN (NITROSTAT) 0.4 MG SL tablet Place 0.4 mg under the tongue every 5 (five) minutes as needed for chest pain.    . sildenafil (REVATIO) 20 MG tablet Take 40 mg by mouth daily as needed (erectile dysfunction).     . temazepam (RESTORIL) 30 MG capsule Take 1 capsule (30 mg total) by mouth at bedtime as needed for sleep. 30 capsule 5   No current facility-administered medications for this visit.    Allergies  Allergen Reactions  . Lipitor [Atorvastatin Calcium] Other (See Comments)  Muscle pain--Pt stated he can tolerate a low dose  . Mevacor [Lovastatin] Other (See Comments)    "makes me feel loopy"    Patient Active Problem List   Diagnosis Date Noted  . Hypercholesterolemia 10/22/2010    Priority: High  . CAD (coronary artery disease)   . Aortic valve disease   . Hypertension   . 1St degree AV block   . Ischemic cardiomyopathy   . GERD (gastroesophageal reflux disease)   . OSA (obstructive sleep apnea)   . Vertigo   . Kidney stone   . Hyperlipidemia   . Nonspecific abnormal  unspecified cardiovascular function study 02/03/2014  . OSA on CPAP 09/18/2013  . Sleep apnea 03/20/2012  . Neuropathy 02/21/2011  . Benign hypertensive heart disease without heart failure     History  Smoking status  . Former Smoker  Smokeless tobacco  . Never Used    Comment: 1968    History  Alcohol Use  . 0.0 oz/week    Comment: rarely    Family History  Problem Relation Age of Onset  . Heart Problems Mother   . Parkinson's disease Father 57  . Other Sister     Muscular degeneration    Review of Systems: Constitutional: no fever chills diaphoresis or fatigue or change in weight.  Head and neck: no hearing loss, no epistaxis, no photophobia or visual disturbance. Respiratory: No cough, shortness of breath or wheezing. Cardiovascular: No chest pain peripheral edema, palpitations. Gastrointestinal: No abdominal distention, no abdominal pain, no change in bowel habits hematochezia or melena. Genitourinary: No dysuria, no frequency, no urgency, no nocturia. Musculoskeletal:No arthralgias, no back pain, no gait disturbance or myalgias. Neurological: No dizziness, no headaches, no numbness, no seizures, no syncope, no weakness, no tremors. Hematologic: No lymphadenopathy, no easy bruising. Psychiatric: No confusion, no hallucinations, no sleep disturbance.    Physical Exam: Filed Vitals:   06/02/14 0843  BP: 122/74  Pulse: 72  The patient appears to be in no distress.  Head and neck exam reveals that the pupils are equal and reactive.  The extraocular movements are full.  There is no scleral icterus.  Mouth and pharynx are benign.  No lymphadenopathy.  No carotid bruits.  The jugular venous pressure is normal.  Thyroid is not enlarged or tender.  Chest is clear to percussion and auscultation.  No rales or rhonchi.  Expansion of the chest is symmetrical.  Heart reveals no abnormal lift or heave.  First and second heart sounds are normal.  There is grade 2/6 systolic  murmur at the base.  No diastolic murmur.  The abdomen is soft and nontender.  Bowel sounds are normoactive.  There is no hepatosplenomegaly or mass.  There are no abdominal bruits.  Extremities reveal no phlebitis or edema.  Pedal pulses are good.  There is no cyanosis or clubbing.  Neurologic exam is normal strength and no lateralizing weakness.  No sensory deficits.  Integument reveals no rash  EKG shows normal sinus rhythm with first-degree AV block and bifascicular block and is unchanged from 02/21/14  Assessment / Plan: 1.  Ischemic heart disease with cardial infarction in 2003 with PCI to his LAD and circumflex at that time.  Also a prior stent to his right coronary artery.  In July 2015 the patient had drug-eluting stent placed in the mid LAD , distal to the previously placed stent which is still widely patent. 2. ischemic cardiomyopathy with ejection fraction 40-45% 3. Hypercholesterolemia 4. aortic valve sclerosis 5.  Benign hypertensive heart disease without heart failure 6. Bifascicular block and first-degree AV block asymptomatic 7.  Recurrent episodes of abdominal bloating.  He is scheduled for a second opinion at [facility].  Dr. Cristina Gong is his gastroenterologist here  Plan: Continue current therapy.  Recheck in 4 months for office visit  lipid panel hepatic function panel and basal metabolic panel

## 2014-06-02 NOTE — Assessment & Plan Note (Signed)
No recurrent angina since last stent in July 2015

## 2014-06-02 NOTE — Patient Instructions (Signed)
Your physician recommends that you continue on your current medications as directed. Please refer to the Current Medication list given to you today.  Your physician recommends that you schedule a follow-up appointment in: 4 months with fasting labs (lp/bmet/hfp)  

## 2014-06-05 DIAGNOSIS — R140 Abdominal distension (gaseous): Secondary | ICD-10-CM | POA: Diagnosis not present

## 2014-06-13 ENCOUNTER — Other Ambulatory Visit: Payer: Self-pay | Admitting: *Deleted

## 2014-06-13 DIAGNOSIS — G4700 Insomnia, unspecified: Secondary | ICD-10-CM

## 2014-06-13 MED ORDER — ZOLPIDEM TARTRATE 10 MG PO TABS: 10.0000 mg | ORAL_TABLET | Freq: Every evening | ORAL | Status: DC | PRN

## 2014-06-13 NOTE — Telephone Encounter (Signed)
Patients wife in office today for ov and told  Dr. Mare Ferrari Temazepam not working as well for her husband and would like to go back on Ambien.  Per  Dr. Mare Ferrari ok to call in Ambien 10 mg at bedtime as needed 90 day with 1 refill

## 2014-06-14 ENCOUNTER — Other Ambulatory Visit: Payer: Self-pay | Admitting: Cardiology

## 2014-07-02 ENCOUNTER — Telehealth: Payer: Self-pay | Admitting: Cardiology

## 2014-07-02 NOTE — Telephone Encounter (Signed)
New Msg  Left vm on pt home number advising of flu clinic Saturday.

## 2014-07-05 ENCOUNTER — Other Ambulatory Visit: Payer: Self-pay | Admitting: Cardiology

## 2014-07-10 ENCOUNTER — Encounter (HOSPITAL_COMMUNITY): Payer: Self-pay | Admitting: Interventional Cardiology

## 2014-07-20 ENCOUNTER — Telehealth: Payer: Medicare Other | Admitting: Family

## 2014-07-20 DIAGNOSIS — J019 Acute sinusitis, unspecified: Secondary | ICD-10-CM

## 2014-07-20 MED ORDER — AMOXICILLIN-POT CLAVULANATE 875-125 MG PO TABS: 1.0000 | ORAL_TABLET | Freq: Two times a day (BID) | ORAL | Status: DC

## 2014-07-20 NOTE — Progress Notes (Signed)

## 2014-08-01 HISTORY — PX: CARPAL TUNNEL RELEASE: SHX101

## 2014-08-05 ENCOUNTER — Other Ambulatory Visit: Payer: Self-pay | Admitting: Cardiology

## 2014-09-10 DIAGNOSIS — J342 Deviated nasal septum: Secondary | ICD-10-CM | POA: Diagnosis not present

## 2014-09-10 DIAGNOSIS — J343 Hypertrophy of nasal turbinates: Secondary | ICD-10-CM | POA: Diagnosis not present

## 2014-09-10 DIAGNOSIS — G4733 Obstructive sleep apnea (adult) (pediatric): Secondary | ICD-10-CM | POA: Diagnosis not present

## 2014-09-18 ENCOUNTER — Encounter: Payer: Self-pay | Admitting: Neurology

## 2014-09-18 ENCOUNTER — Ambulatory Visit: Payer: Medicare Other | Admitting: Neurology

## 2014-09-18 ENCOUNTER — Ambulatory Visit (INDEPENDENT_AMBULATORY_CARE_PROVIDER_SITE_OTHER): Payer: Medicare Other | Admitting: Neurology

## 2014-09-18 VITALS — BP 125/73 | HR 66 | Resp 18 | Ht 72.0 in | Wt 212.0 lb

## 2014-09-18 DIAGNOSIS — J322 Chronic ethmoidal sinusitis: Secondary | ICD-10-CM | POA: Diagnosis not present

## 2014-09-18 DIAGNOSIS — G5792 Unspecified mononeuropathy of left lower limb: Secondary | ICD-10-CM | POA: Diagnosis not present

## 2014-09-18 DIAGNOSIS — G4733 Obstructive sleep apnea (adult) (pediatric): Secondary | ICD-10-CM | POA: Diagnosis not present

## 2014-09-18 DIAGNOSIS — G5791 Unspecified mononeuropathy of right lower limb: Secondary | ICD-10-CM

## 2014-09-18 DIAGNOSIS — G5793 Unspecified mononeuropathy of bilateral lower limbs: Secondary | ICD-10-CM | POA: Insufficient documentation

## 2014-09-18 DIAGNOSIS — Z9989 Dependence on other enabling machines and devices: Secondary | ICD-10-CM

## 2014-09-18 NOTE — Progress Notes (Signed)
[facility]  Provider:  Larey Seat, M D  Referring Provider: Darlin Coco, MD Primary Care Physician:  Darlin Coco, MD    OSA on CPAP :   HPI:  Gabriel [name] is a 80 y.o. male  Is seen here as a  revisit  from Dr. Mare [name] , his cardiologist , for a CPAP follow up.  [name] has been followed in this clinic since 2012 he underwent a sleep study, ordered by Dr. Mare [name], on 04-19-11.  At that time he has a past medical history of coronary artery disease, optic valve disease, peripheral neuropathy and hypertension.  He had mostly complained of restless legs and witnessed apneas. The patient was diagnosed with an AHI of 24.9 and an RDI of 28.5. There was a clear accentuation of apnea during REM sleep - the REM AHI being 48.9,  supine AHI was also elevated at 40 over the nonsupine AHI of of 14. Nadir of  oxygen saturation was 77% and 59 minutes of desaturations total time.  The patient returned for a titration study and was titrated to 6 cm water pressure and 2 centimeter EPR. He had frequent sinusitis before CPAP use, and not since. He is not longer snoring. He may have one nocturia if any at all. Overall sleep duration is 7 hours at night , occasional naps in the afternoon.   09-18-14  CPAP download shows the patient using the machine 7 hours and 28 minutes nightly, with a residual AHI of 3.0 , a moderate air leak.  The patient uses a nasal pillow with heated humidity. He has a 100% compliance for CPAP use above 4 hours nightly user time.  Out of 90 days the machine was used 90 days, exemplary user.  His restless legs have been asymptomatic - he had PLMs , no longer RLS.   The pets states that he feels he can breathe much better once he starts using CPAP otherwise he often has an congested nose he also has trouble with repeated sinusitis infections, and he has spoken to Dr. Wilburn [name] who diagnosed him with a deviated septum and wanted me to discuss today with  the patient as this could improve his overall nasal airflow as well as the CPAP compliance or tolerance. I think , the patient is obviously doing very well with his CPAP as is but he has discomfort and frequent  Nasal discharges he also complains of a postnasal drip. He could benefit indeed from an septal correction surgery and perhaps an adenoid  trimming.  Review of Systems: 09-18-14  Out of a complete 14 system review, the patient complains of only the following symptoms, and all other reviewed systems are negative. The patient endorsed the geriatric depression score at 0 points, the restless leg score at mild degree of impairment. FSS at  19 from 28 and Epworth at  6 and was at 6 points.   History   Social History  . Marital Status: Married    Spouse Name: Gabriel [name]  . Number of Children: 6  . Years of Education: hs   Occupational History  .     Social History Main Topics  . Smoking status: Former Research scientist (life sciences)  . Smokeless tobacco: Never Used     Comment: 1968  . Alcohol Use: 0.0 oz/week    0 Standard drinks or equivalent per week     Comment: rarely  . Drug Use: No  . Sexual Activity: Yes   Other Topics Concern  . Not  on file   Social History Narrative   Patient is married Gabriel [name]).   Patient has four biological children and two adopted children.   Patient is retired from Electronics engineer.   Patient has a high school education.   Patient drinks coffee (12oz) daily.   Patient is right-handed.                Family History  Problem Relation Age of Onset  . Heart Problems Mother   . Parkinson's disease Father 68  . Other Sister     Muscular degeneration    Past Medical History  Diagnosis Date  . CAD (coronary artery disease)     a. s/p PTCA 1995. b. MI in 2003 s/p stenting to LAD 2003, s/p stent to Cx 2003. c. prior rotablator to PTCA vessel. d. Abnl nuc 01/2014: s/p DES to LAD, mild-mod nonobstructive disease otherwise.  Marland Kitchen Aortic valve disease     a.  Mild AS, AI by echo 01/2014.  Marland Kitchen Hypertension   . Neuropathy   . Hyperlipidemia     a. Can only tolerate low dose statins.  . Kidney stone   . Insomnia   . Vertigo     Had Epley maneuver, vestibular rehab.  . OSA (obstructive sleep apnea)     with upper airway resistancy  . GERD (gastroesophageal reflux disease)   . 1St degree AV block   . RBBB   . Ischemic cardiomyopathy     a. EF 40-45% by echo 02/04/14, 40-45% by cath 02/05/14 (similar to 2010).    Past Surgical History  Procedure Laterality Date  . Coronary stent placement  2003    LCX  . Cholecystectomy  2009  . Meniscus removal- knee Right   . Shoulder open rotator cuff repair  1990  . Hernia repair  1994    double  . Cataract extraction, bilateral  2004  . Kidney stone surgery  10/2005  . Lithotripsy  10/2005  . Cardiac catheterization  2003    Following MI  . Coronary angioplasty  2003    LAD  . Left heart cath  02/05/2014  . Left heart catheterization with coronary angiogram N/A 02/05/2014    Procedure: LEFT HEART CATHETERIZATION WITH CORONARY ANGIOGRAM;  Surgeon: Gabriel Booze, MD;  Location: [facility] CATH LAB;  Service: Cardiovascular;  Laterality: N/A;  . Percutaneous coronary stent intervention (pci-s) Right 02/05/2014    Procedure: PERCUTANEOUS CORONARY STENT INTERVENTION (PCI-S);  Surgeon: Gabriel Booze, MD;  Location: [facility] CATH LAB;  Service: Cardiovascular;  Laterality: Right;    Current Outpatient Prescriptions  Medication Sig Dispense Refill  . amLODipine (NORVASC) 5 MG tablet TAKE 1 TABLET (5 MG TOTAL) BY MOUTH DAILY. 90 tablet 0  . aspirin EC 81 MG tablet Take 81 mg by mouth daily.    Marland Kitchen atorvastatin (LIPITOR) 10 MG tablet TAKE 1 TABLET (10 MG TOTAL) BY MOUTH DAILY. 30 tablet 6  . azithromycin (ZITHROMAX) 250 MG tablet 2 tablets on day 1 and then 1 daily until finished 6 each 0  . clopidogrel (PLAVIX) 75 MG tablet TAKE 1 TABLET BY MOUTH DAILY. 90 tablet 1  . ezetimibe (ZETIA) 10 MG tablet Take 1 tablet (10  mg total) by mouth daily. 90 tablet 3  . fluticasone (FLONASE) 50 MCG/ACT nasal spray   11  . gabapentin (NEURONTIN) 800 MG tablet Take 1 tablet (800 mg total) by mouth 3 (three) times daily. 270 tablet 5  . lisinopril (PRINIVIL,ZESTRIL) 20 MG tablet Take 20 mg  by mouth daily.    Marland Kitchen loratadine (CLARITIN) 10 MG tablet Take 10 mg by mouth daily as needed. For allergies.    . metoprolol succinate (TOPROL-XL) 25 MG 24 hr tablet TAKE 1 TABLET BY MOUTH DAILY 90 tablet 3  . Multiple Vitamins-Minerals (MULTIVITAMINS THER. W/MINERALS) TABS Take 1 tablet by mouth daily.    . nitroGLYCERIN (NITROSTAT) 0.4 MG SL tablet Place 0.4 mg under the tongue every 5 (five) minutes as needed for chest pain.    . sildenafil (REVATIO) 20 MG tablet Take 40 mg by mouth daily as needed (erectile dysfunction).     . temazepam (RESTORIL) 30 MG capsule Take 1 capsule (30 mg total) by mouth at bedtime as needed for sleep. 30 capsule 5  . zolpidem (AMBIEN) 10 MG tablet Take 10 mg by mouth at bedtime as needed for sleep.     No current facility-administered medications for this visit.    Allergies as of 09/18/2014 - Review Complete 09/18/2014  Allergen Reaction Noted  . Lipitor [atorvastatin calcium] Other (See Comments) 08/30/2010  . Mevacor [lovastatin] Other (See Comments) 08/30/2010    Vitals: BP 125/73 mmHg  Pulse 66  Resp 18  Ht 6' (1.829 m)  Wt 212 lb (96.163 kg)  BMI 28.75 kg/m2 Last Weight:  Wt Readings from Last 1 Encounters:  09/18/14 212 lb (96.163 kg)   Last Height:   Ht Readings from Last 1 Encounters:  09/18/14 6' (1.829 m)     Physical exam:  General: The patient is awake, alert and appears not in acute distress. The patient is well groomed. Head: Normocephalic, atraumatic. Neck is supple. Mallampati 3 , neck circumference:15.25  inches  Cardiovascular:  Regular rate and rhythm, without  murmurs or carotid bruit, and without distended neck veins. Respiratory: Lungs are clear to  auscultation. Skin:  Without evidence of edema, or rash Trunk: BMI is elevated , the patient has normal posture, no scoliosis .  Neurologic exam : The patient is awake and alert, oriented to place and time. Memory subjective  described as intact. There is a normal attention span & concentration ability. Speech is fluent without  dysarthria, dysphonia or aphasia. Mood and affect are appropriate.  Cranial nerves: Pupils are equal and briskly reactive to light. Funduscopic exam without evidence of pallor or edema. Extraocular movements  in vertical and horizontal planes intact and without nystagmus. Visual fields by finger perimetry are intact. Hearing to finger rub intact.  Facial sensation intact to fine touch. Facial motor strength is symmetric and tongue and uvula move midline. Motor exam:  Normal tone and symmetric normal strength in all extremities.  Sensory:  Fine touch, pinprick and vibration were tested in all extremities. In his fingertips, his sensation has been decreased to fine touch and vibration, but not to temperature or pain. Marland Kitchen Proprioception is tested in the upper extremities only. This was normal. Very dry finger tips, skin is "parched". Decreased toe vibration and ankle vibration.  Coordination: Rapid alternating movements in the fingers/hands is tested and normal.  Finger-to-nose maneuver tested and normal without evidence of ataxia, dysmetria or tremor. Gait and station: Patient walks without assistive device and is able and assisted stool climb up to the exam table. Strength within normal limits. Stance is stable and normal. Steps are unfragmented. Deep tendon reflexes: in the  upper and lower extremities are symmetric and intact. Babinski maneuver response is downgoing.   Assessment:  After physical and neurologic examination, review of laboratory studies, imaging, neurophysiology testing and pre-existing  records, assessment is   1) Burning dysesthesias as the day progresses  , mostly in feet. Numb fingertips has affected his ability to turn pages-  neuropathy . Small fiber.  No associated dysautonomia?.  He denies constipation, has orthostatic lightheadedness, but he no longer sweats , even in summertime.   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   2) OSA - treated well on CPAP, 10 cm water , compliance is high , Yearly RV.   3) He has overcome Myalgia , attributed to statin use. He tolerates Lipitor 10 mg.   4) gluten free and lactate free diet.  Lost body weight.   5) not allergic rhinitis, but nasal congestion all year round,  Nasal septal deviation, Will ask Dr Gabriel [name] to see patient regarding surgical correction.    Plan:  Treatment plan and additional workup :continue CPAP and Neurontin.   Referral to ENT, he may benefit from nasal septal correction.

## 2014-09-26 ENCOUNTER — Other Ambulatory Visit (INDEPENDENT_AMBULATORY_CARE_PROVIDER_SITE_OTHER): Payer: Medicare Other | Admitting: *Deleted

## 2014-09-26 ENCOUNTER — Other Ambulatory Visit: Payer: Self-pay | Admitting: Neurology

## 2014-09-26 DIAGNOSIS — E7800 Pure hypercholesterolemia, unspecified: Secondary | ICD-10-CM

## 2014-09-26 DIAGNOSIS — I10 Essential (primary) hypertension: Secondary | ICD-10-CM | POA: Diagnosis not present

## 2014-09-26 LAB — HEPATIC FUNCTION PANEL
ALBUMIN: 4 g/dL (ref 3.5–5.2)
ALK PHOS: 42 U/L (ref 39–117)
ALT: 19 U/L (ref 0–53)
AST: 21 U/L (ref 0–37)
Bilirubin, Direct: 0.1 mg/dL (ref 0.0–0.3)
Total Bilirubin: 0.8 mg/dL (ref 0.2–1.2)
Total Protein: 6.9 g/dL (ref 6.0–8.3)

## 2014-09-26 LAB — LIPID PANEL
CHOLESTEROL: 151 mg/dL (ref 0–200)
HDL: 46.6 mg/dL (ref >39.00)
LDL CALC: 64 mg/dL (ref 0–99)
NonHDL: 104.4
Total CHOL/HDL Ratio: 3
Triglycerides: 200 mg/dL — ABNORMAL HIGH (ref 0.0–149.0)
VLDL: 40 mg/dL (ref 0.0–40.0)

## 2014-09-26 LAB — BASIC METABOLIC PANEL
BUN: 17 mg/dL (ref 6–23)
CO2: 31 mEq/L (ref 19–32)
Calcium: 9 mg/dL (ref 8.4–10.5)
Chloride: 103 mEq/L (ref 96–112)
Creatinine, Ser: 1.28 mg/dL (ref 0.40–1.50)
GFR: 57.75 mL/min — AB (ref >60.00)
GLUCOSE: 112 mg/dL — AB (ref 70–99)
Potassium: 4.1 mEq/L (ref 3.5–5.1)
Sodium: 138 mEq/L (ref 135–145)

## 2014-09-26 NOTE — Addendum Note (Signed)
Addended by: Eulis Foster on: 09/26/2014 07:51 AM   Modules accepted: Orders

## 2014-09-28 NOTE — Progress Notes (Signed)
Quick Note:  Please make copy of labs for patient visit. ______ 

## 2014-10-01 ENCOUNTER — Encounter: Payer: Self-pay | Admitting: Cardiology

## 2014-10-01 ENCOUNTER — Ambulatory Visit (INDEPENDENT_AMBULATORY_CARE_PROVIDER_SITE_OTHER): Payer: Medicare Other | Admitting: Cardiology

## 2014-10-01 VITALS — BP 130/82 | HR 70 | Ht 72.0 in | Wt 205.0 lb

## 2014-10-01 DIAGNOSIS — I358 Other nonrheumatic aortic valve disorders: Secondary | ICD-10-CM | POA: Diagnosis not present

## 2014-10-01 DIAGNOSIS — E7800 Pure hypercholesterolemia, unspecified: Secondary | ICD-10-CM

## 2014-10-01 DIAGNOSIS — I2510 Atherosclerotic heart disease of native coronary artery without angina pectoris: Secondary | ICD-10-CM

## 2014-10-01 DIAGNOSIS — I119 Hypertensive heart disease without heart failure: Secondary | ICD-10-CM

## 2014-10-01 NOTE — Progress Notes (Signed)
Cardiology Office Note   Date:  10/01/2014   ID:  Gabriel [name], DOB May 18, 1935, MRN [id number]  PCP:  Darlin Coco, MD  Cardiologist:   Darlin Coco, MD   No chief complaint on file.     History of Present Illness: Gabriel [name] is a 80 y.o. male who presents for follow-up office visit.  This pleasant 80 year old gentleman is seen for a scheduled followup office visit. The patient has a history of ischemic heart disease. He had a myocardial infarction in 2003 treated with PCI to his LAD. He also had a stent to his circumflex in 2003. 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 He has a history of hypercholesterolemia and essential hypertension. Patient also has a past history of kidney stones treated with lithotripsy. He tries to drink a lot of water to keep the kidney stones from reforming. The patient presented to [facility] with complaints of chest burning with exertion, including sexual intercourse. This was relieved with rest. Nuclear stress test was performed which demonstrated an anterior, apical reversible defect with on the images. Recent echo 02/04/14 showed mild LVH with focal basal hypertrophy, EF 40-45% with moderate hypokinesis of the midanteroseptal myocardium, increased relative contribution of atrial contraction to ventricular filling, and mild AS/AI. Cardiac cath was recommended. Dr. Irish Lack performed the cardiac catheterization.  Catheterization revealed the following: 90% focal stenosis in the mid left anterior descending artery. This was successfully treated with a 2.25 x 12 promus drug-eluting stent, postdilated to 2.6 mm in diameter. Previously placed stent in the more proximal portion of the LAD is widely patent. Previous stents in mLCX and RCA were patent or with mild in stent stenosis. Since his cardiac catheterization he has been doing well.  He has had no recurrent chest  pain. He has a lot of breathing problems especially at night.  He has seen Dr. Wilburn Cornelia, ENT physician, who has recommended nasal septal surgery.  The patient has not decided about surgery.  Patient has sleep apnea and uses a CPAP machine. He reports that his gastrointestinal symptoms of bloating have improved since he has been on a gluten-free and lactose-free diet.  Past Medical History  Diagnosis Date  . CAD (coronary artery disease)     a. s/p PTCA 1995. b. MI in 2003 s/p stenting to LAD 2003, s/p stent to Cx 2003. c. prior rotablator to PTCA vessel. d. Abnl nuc 01/2014: s/p DES to LAD, mild-mod nonobstructive disease otherwise.  Marland Kitchen Aortic valve disease     a. Mild AS, AI by echo 01/2014.  Marland Kitchen Hypertension   . Neuropathy   . Hyperlipidemia     a. Can only tolerate low dose statins.  . Kidney stone   . Insomnia   . Vertigo     Had Epley maneuver, vestibular rehab.  . OSA (obstructive sleep apnea)     with upper airway resistancy  . GERD (gastroesophageal reflux disease)   . 1St degree AV block   . RBBB   . Ischemic cardiomyopathy     a. EF 40-45% by echo 02/04/14, 40-45% by cath 02/05/14 (similar to 2010).    Past Surgical History  Procedure Laterality Date  . Coronary stent placement  2003    LCX  . Cholecystectomy  2009  . Meniscus removal- knee Right   . Shoulder open rotator cuff repair  1990  . Hernia repair  1994    double  .  Cataract extraction, bilateral  2004  . Kidney stone surgery  10/2005  . Lithotripsy  10/2005  . Cardiac catheterization  2003    Following MI  . Coronary angioplasty  2003    LAD  . Left heart cath  02/05/2014  . Left heart catheterization with coronary angiogram N/A 02/05/2014    Procedure: LEFT HEART CATHETERIZATION WITH CORONARY ANGIOGRAM;  Surgeon: Jettie Booze, MD;  Location: [facility] CATH LAB;  Service: Cardiovascular;  Laterality: N/A;  . Percutaneous coronary stent intervention (pci-s) Right 02/05/2014    Procedure: PERCUTANEOUS CORONARY  STENT INTERVENTION (PCI-S);  Surgeon: Jettie Booze, MD;  Location: [facility] CATH LAB;  Service: Cardiovascular;  Laterality: Right;     Current Outpatient Prescriptions  Medication Sig Dispense Refill  . amLODipine (NORVASC) 5 MG tablet TAKE 1 TABLET (5 MG TOTAL) BY MOUTH DAILY. 90 tablet 0  . aspirin EC 81 MG tablet Take 81 mg by mouth daily.    Marland Kitchen atorvastatin (LIPITOR) 10 MG tablet TAKE 1 TABLET (10 MG TOTAL) BY MOUTH DAILY. 30 tablet 6  . clopidogrel (PLAVIX) 75 MG tablet TAKE 1 TABLET BY MOUTH DAILY. 90 tablet 1  . ezetimibe (ZETIA) 10 MG tablet Take 1 tablet (10 mg total) by mouth daily. 90 tablet 3  . fluticasone (FLONASE) 50 MCG/ACT nasal spray Place 2 sprays into both nostrils as needed.   11  . gabapentin (NEURONTIN) 800 MG tablet TAKE 1 TABLET (800 MG TOTAL) BY MOUTH 3 (THREE) TIMES DAILY. 270 tablet 2  . lisinopril (PRINIVIL,ZESTRIL) 20 MG tablet Take 20 mg by mouth daily.    Marland Kitchen loratadine (CLARITIN) 10 MG tablet Take 10 mg by mouth daily as needed. For allergies.    . metoprolol succinate (TOPROL-XL) 25 MG 24 hr tablet TAKE 1 TABLET BY MOUTH DAILY 90 tablet 3  . Multiple Vitamins-Minerals (MULTIVITAMINS THER. W/MINERALS) TABS Take 1 tablet by mouth daily.    . nitroGLYCERIN (NITROSTAT) 0.4 MG SL tablet Place 0.4 mg under the tongue every 5 (five) minutes as needed for chest pain.    . sildenafil (REVATIO) 20 MG tablet Take 40 mg by mouth daily as needed (erectile dysfunction).     Marland Kitchen zolpidem (AMBIEN) 10 MG tablet Take 10 mg by mouth at bedtime as needed for sleep.     No current facility-administered medications for this visit.    Allergies:   Lipitor and Mevacor    Social History:  The patient  reports that he has quit smoking. He has never used smokeless tobacco. He reports that he drinks alcohol. He reports that he does not use illicit drugs.   Family History:  The patient's family history includes Heart Problems in his mother; Other in his sister; Parkinson's disease (age  of onset: 87) in his father.    ROS:  Please see the history of present illness.   Otherwise, review of systems are positive for none.   All other systems are reviewed and negative.    PHYSICAL EXAM: VS:  BP 130/82 mmHg  Pulse 70  Ht 6' (1.829 m)  Wt 205 lb (92.987 kg)  BMI 27.80 kg/m2 , BMI Body mass index is 27.8 kg/(m^2). GEN: Well nourished, well developed, in no acute distress HEENT: normal Neck: no JVD, carotid bruits, or masses Cardiac: RRR; grade 2/6 systolic ejection murmur at the base.  No rubs, or gallops,no edema  Respiratory:  clear to auscultation bilaterally, normal work of breathing GI: soft, nontender, nondistended, + BS MS: no deformity or atrophy Skin:  warm and dry, no rash Neuro:  Strength and sensation are intact Psych: euthymic mood, full affect   EKG:  EKG is not ordered today.    Recent Labs: 02/06/2014: Hemoglobin 12.8*; Platelets 174 09/26/2014: ALT 19; BUN 17; Creatinine 1.28; Potassium 4.1; Sodium 138    Lipid Panel    Component Value Date/Time   CHOL 151 09/26/2014 0751   TRIG 200.0* 09/26/2014 0751   HDL 46.60 09/26/2014 0751   CHOLHDL 3 09/26/2014 0751   VLDL 40.0 09/26/2014 0751   LDLCALC 64 09/26/2014 0751   LDLDIRECT 145.5 09/16/2013 0811      Wt Readings from Last 3 Encounters:  10/01/14 205 lb (92.987 kg)  09/18/14 212 lb (96.163 kg)  06/02/14 204 lb (92.534 kg)         ASSESSMENT AND PLAN:  1. Ischemic heart disease with cardial infarction in 2003 with PCI to his LAD and circumflex at that time. Also a prior stent to his right coronary artery. In July 2015 the patient had drug-eluting stent placed in the mid LAD , distal to the previously placed stent which is still widely patent. 2. ischemic cardiomyopathy with ejection fraction 40-45% 3. Hypercholesterolemia 4. aortic valve sclerosis 5. Benign hypertensive heart disease without heart failure 6. Bifascicular block and first-degree AV block asymptomatic 7.  Recurrent episodes of abdominal bloating, improved on gluten-free lactose-free diet.  Followed at [facility].  Plan: Continue current therapy. Recheck in 4 months for office visit lipid panel hepatic function panel and basal metabolic panel   Current medicines are reviewed at length with the patient today.  The patient does not have concerns regarding medicines.  The following changes have been made:  no change  Labs/ tests ordered today include:  No orders of the defined types were placed in this encounter.     Disposition:   FU with Dr. Mare Ferrari in 4 months for office visit lipid panel hepatic function panel and basal metabolic panel   Signed, Darlin Coco, MD  10/01/2014 1:15 PM    [facility] [address], [place], [place]  [address] Phone: [phone]; Fax: [phone]

## 2014-10-01 NOTE — Patient Instructions (Signed)
Your physician recommends that you continue on your current medications as directed. Please refer to the Current Medication list given to you today.  Your physician wants you to follow-up in: 4 months with fasting labs (lp/bmet/hfp) You will receive a reminder letter in the mail two months in advance. If you don't receive a letter, please call our office to schedule the follow-up appointment.  

## 2014-10-06 ENCOUNTER — Other Ambulatory Visit: Payer: Self-pay | Admitting: *Deleted

## 2014-10-06 MED ORDER — EZETIMIBE 10 MG PO TABS: 10.0000 mg | ORAL_TABLET | Freq: Every day | ORAL | Status: DC

## 2014-10-31 ENCOUNTER — Other Ambulatory Visit: Payer: Self-pay | Admitting: Cardiology

## 2014-12-05 ENCOUNTER — Other Ambulatory Visit: Payer: Self-pay

## 2014-12-05 ENCOUNTER — Telehealth: Payer: Self-pay

## 2014-12-05 MED ORDER — ZOLPIDEM TARTRATE 10 MG PO TABS: 10.0000 mg | ORAL_TABLET | Freq: Every evening | ORAL | Status: DC | PRN

## 2014-12-05 NOTE — Telephone Encounter (Signed)
Okay to refill ambien 

## 2014-12-09 ENCOUNTER — Other Ambulatory Visit: Payer: Self-pay

## 2014-12-10 ENCOUNTER — Telehealth: Payer: Self-pay | Admitting: *Deleted

## 2014-12-10 NOTE — Telephone Encounter (Signed)
Patients wife stated that the [place] requires prior authorization. Please advise. Thanks, MI

## 2014-12-10 NOTE — Telephone Encounter (Signed)
Advise wife medication approved, left message at pharmacy

## 2014-12-22 ENCOUNTER — Other Ambulatory Visit: Payer: Self-pay | Admitting: Cardiology

## 2014-12-23 ENCOUNTER — Other Ambulatory Visit: Payer: Self-pay | Admitting: Cardiology

## 2014-12-30 DIAGNOSIS — L821 Other seborrheic keratosis: Secondary | ICD-10-CM | POA: Diagnosis not present

## 2014-12-30 DIAGNOSIS — Z85828 Personal history of other malignant neoplasm of skin: Secondary | ICD-10-CM | POA: Diagnosis not present

## 2014-12-30 DIAGNOSIS — L570 Actinic keratosis: Secondary | ICD-10-CM | POA: Diagnosis not present

## 2015-01-22 ENCOUNTER — Other Ambulatory Visit: Payer: Self-pay | Admitting: Cardiology

## 2015-01-25 ENCOUNTER — Other Ambulatory Visit: Payer: Self-pay | Admitting: Cardiology

## 2015-01-26 ENCOUNTER — Other Ambulatory Visit: Payer: Self-pay

## 2015-01-26 ENCOUNTER — Other Ambulatory Visit: Payer: Self-pay | Admitting: Cardiology

## 2015-01-26 MED ORDER — LISINOPRIL 20 MG PO TABS: 20.0000 mg | ORAL_TABLET | Freq: Every day | ORAL | Status: DC

## 2015-02-05 ENCOUNTER — Other Ambulatory Visit (INDEPENDENT_AMBULATORY_CARE_PROVIDER_SITE_OTHER): Payer: Medicare Other | Admitting: *Deleted

## 2015-02-05 DIAGNOSIS — I10 Essential (primary) hypertension: Secondary | ICD-10-CM

## 2015-02-05 DIAGNOSIS — E7800 Pure hypercholesterolemia, unspecified: Secondary | ICD-10-CM

## 2015-02-05 LAB — BASIC METABOLIC PANEL
BUN: 20 mg/dL (ref 6–23)
CALCIUM: 9.1 mg/dL (ref 8.4–10.5)
CHLORIDE: 103 meq/L (ref 96–112)
CO2: 28 meq/L (ref 19–32)
Creatinine, Ser: 1.39 mg/dL (ref 0.40–1.50)
GFR: 52.46 mL/min — ABNORMAL LOW (ref >60.00)
Glucose, Bld: 97 mg/dL (ref 70–99)
Potassium: 4.3 mEq/L (ref 3.5–5.1)
SODIUM: 138 meq/L (ref 135–145)

## 2015-02-05 LAB — HEPATIC FUNCTION PANEL
ALT: 19 U/L (ref 0–53)
AST: 21 U/L (ref 0–37)
Albumin: 3.9 g/dL (ref 3.5–5.2)
Alkaline Phosphatase: 42 U/L (ref 39–117)
BILIRUBIN DIRECT: 0.2 mg/dL (ref 0.0–0.3)
Total Bilirubin: 0.9 mg/dL (ref 0.2–1.2)
Total Protein: 6.7 g/dL (ref 6.0–8.3)

## 2015-02-05 LAB — LIPID PANEL
CHOL/HDL RATIO: 3
CHOLESTEROL: 153 mg/dL (ref 0–200)
HDL: 48.3 mg/dL (ref >39.00)
LDL Cholesterol: 74 mg/dL (ref 0–99)
NonHDL: 104.7
TRIGLYCERIDES: 154 mg/dL — AB (ref 0.0–149.0)
VLDL: 30.8 mg/dL (ref 0.0–40.0)

## 2015-02-05 NOTE — Progress Notes (Signed)
Quick Note:  Please make copy of labs for patient visit. ______ 

## 2015-02-05 NOTE — Addendum Note (Signed)
Addended by: Eulis Foster on: 02/05/2015 08:02 AM   Modules accepted: Orders

## 2015-02-09 ENCOUNTER — Encounter: Payer: Self-pay | Admitting: Cardiology

## 2015-02-09 ENCOUNTER — Other Ambulatory Visit: Payer: Self-pay | Admitting: Cardiology

## 2015-02-09 ENCOUNTER — Ambulatory Visit (INDEPENDENT_AMBULATORY_CARE_PROVIDER_SITE_OTHER): Payer: Medicare Other | Admitting: Cardiology

## 2015-02-09 VITALS — BP 111/70 | HR 72 | Ht 72.0 in | Wt 201.0 lb

## 2015-02-09 DIAGNOSIS — I358 Other nonrheumatic aortic valve disorders: Secondary | ICD-10-CM | POA: Diagnosis not present

## 2015-02-09 DIAGNOSIS — I2510 Atherosclerotic heart disease of native coronary artery without angina pectoris: Secondary | ICD-10-CM

## 2015-02-09 DIAGNOSIS — I119 Hypertensive heart disease without heart failure: Secondary | ICD-10-CM

## 2015-02-09 DIAGNOSIS — E7800 Pure hypercholesterolemia, unspecified: Secondary | ICD-10-CM

## 2015-02-09 MED ORDER — SILDENAFIL CITRATE 20 MG PO TABS: 40.0000 mg | ORAL_TABLET | Freq: Every day | ORAL | Status: DC | PRN

## 2015-02-09 NOTE — Progress Notes (Signed)
Cardiology Office Note   Date:  02/09/2015   ID:  Gabriel [name], DOB Dec 06, 1934, MRN [id number]  PCP:  Warren Danes, MD  Cardiologist: Darlin Coco MD  Chief Complaint  Patient presents with  . HYPERTENSION      History of Present Illness: Gabriel [name] is a 80 y.o. male who presents for a four-month follow-up office visit This pleasant 80 year old gentleman is seen for a scheduled followup office visit. The patient has a history of ischemic heart disease. He had a myocardial infarction in 2003 treated with PCI to his LAD. He also had a stent to his circumflex in 2003. 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 He has a history of hypercholesterolemia and essential hypertension. Patient also has a past history of kidney stones treated with lithotripsy. He tries to drink a lot of water to keep the kidney stones from reforming. The patient presented to [facility] with complaints of chest burning with exertion, including sexual intercourse. This was relieved with rest. Nuclear stress test was performed which demonstrated an anterior, apical reversible defect with on the images. Recent echo 02/04/14 showed mild LVH with focal basal hypertrophy, EF 40-45% with moderate hypokinesis of the midanteroseptal myocardium, increased relative contribution of atrial contraction to ventricular filling, and mild AS/AI. Cardiac cath was recommended. Dr. Irish Lack performed the cardiac catheterization. Catheterization revealed the following: 90% focal stenosis in the mid left anterior descending artery. This was successfully treated with a 2.25 x 12 promus drug-eluting stent, postdilated to 2.6 mm in diameter. Previously placed stent in the more proximal portion of the LAD is widely patent. Previous stents in mLCX and RCA were patent or with mild in stent stenosis. Since his cardiac catheterization he has been doing well.  He has had no recurrent chest pain. He has a lot of breathing problems especially at night. He has seen Dr. Wilburn Cornelia, ENT physician, who has recommended nasal septal surgery. The patient has not decided about surgery. Patient has sleep apnea and uses a CPAP machine.  He continues to have a lot of sinus congestion He has chronic intermittent GI problems.  He has been seen by Dr. Johnney Killian and also down at [facility].  For a while it appeared that his symptoms had improved on a gluten free and lactose free diet.  However now in retrospect he does not think that that change in diet has helped much. The patient has been very busy this summer.  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  He has had some numbness in his fingers and wonders about carpal tunnel.  He will be seeing Dr. Amedeo Plenty.   Past Medical History  Diagnosis Date  . CAD (coronary artery disease)     a. s/p PTCA 1995. b. MI in 2003 s/p stenting to LAD 2003, s/p stent to Cx 2003. c. prior rotablator to PTCA vessel. d. Abnl nuc 01/2014: s/p DES to LAD, mild-mod nonobstructive disease otherwise.  Marland Kitchen Aortic valve disease     a. Mild AS, AI by echo 01/2014.  Marland Kitchen Hypertension   . Neuropathy   . Hyperlipidemia     a. Can only tolerate low dose statins.  . Kidney stone   . Insomnia   . Vertigo     Had Epley maneuver, vestibular rehab.  . OSA (obstructive sleep apnea)     with upper airway resistancy  .  GERD (gastroesophageal reflux disease)   . 1St degree AV block   . RBBB   . Ischemic cardiomyopathy     a. EF 40-45% by echo 02/04/14, 40-45% by cath 02/05/14 (similar to 2010).    Past Surgical History  Procedure Laterality Date  . Coronary stent placement  2003    LCX  . Cholecystectomy  2009  . Meniscus removal- knee Right   . Shoulder open rotator cuff repair  1990  . Hernia repair  1994    double  . Cataract extraction, bilateral  2004  . Kidney stone surgery  10/2005  . Lithotripsy  10/2005  .  Cardiac catheterization  2003    Following MI  . Coronary angioplasty  2003    LAD  . Left heart cath  02/05/2014  . Left heart catheterization with coronary angiogram N/A 02/05/2014    Procedure: LEFT HEART CATHETERIZATION WITH CORONARY ANGIOGRAM;  Surgeon: Jettie Booze, MD;  [facility] LAB;  Service: Cardiovascular;  Laterality: N/A;  . Percutaneous coronary stent intervention (pci-s) Right 02/05/2014    Procedure: PERCUTANEOUS CORONARY STENT INTERVENTION (PCI-S);  Surgeon: Jettie Booze, MD;  Location: [facility] CATH LAB;  Service: Cardiovascular;  Laterality: Right;     Current Outpatient Prescriptions  Medication Sig Dispense Refill  . amLODipine (NORVASC) 5 MG tablet TAKE 1 TABLET (5 MG TOTAL) BY MOUTH DAILY. 90 tablet 1  . aspirin EC 81 MG tablet Take 81 mg by mouth daily.    Marland Kitchen atorvastatin (LIPITOR) 10 MG tablet TAKE 1 TABLET BY MOUTH EVERY DAY 30 tablet 3  . clopidogrel (PLAVIX) 75 MG tablet TAKE 1 TABLET BY MOUTH DAILY. 90 tablet 1  . ezetimibe (ZETIA) 10 MG tablet Take 1 tablet (10 mg total) by mouth daily. 90 tablet 1  . fluticasone (FLONASE) 50 MCG/ACT nasal spray Place 2 sprays into both nostrils as needed.   11  . gabapentin (NEURONTIN) 800 MG tablet TAKE 1 TABLET (800 MG TOTAL) BY MOUTH 3 (THREE) TIMES DAILY. 270 tablet 2  . lisinopril (PRINIVIL,ZESTRIL) 20 MG tablet Take 1 tablet (20 mg total) by mouth daily. 30 tablet 9  . loratadine (CLARITIN) 10 MG tablet Take 10 mg by mouth daily as needed. For allergies.    . metoprolol succinate (TOPROL-XL) 25 MG 24 hr tablet TAKE 1 TABLET BY MOUTH DAILY 90 tablet 3  . Multiple Vitamins-Minerals (MULTIVITAMINS THER. W/MINERALS) TABS Take 1 tablet by mouth daily.    . nitroGLYCERIN (NITROSTAT) 0.4 MG SL tablet Place 0.4 mg under the tongue every 5 (five) minutes as needed for chest pain.    . sildenafil (REVATIO) 20 MG tablet Take 2 tablets (40 mg total) by mouth daily as needed. 100 tablet 3  . zolpidem (AMBIEN) 10 MG tablet  Take 1 tablet (10 mg total) by mouth at bedtime as needed for sleep. 30 tablet 5   No current facility-administered medications for this visit.    Allergies:   Lipitor and Mevacor    Social History:  The patient  reports that he has quit smoking. He has never used smokeless tobacco. He reports that he drinks alcohol. He reports that he does not use illicit drugs.   Family History:  The patient's family history includes Heart Problems in his mother; Other in his sister; Parkinson's disease (age of onset: 56) in his father. There is no history of Heart attack or Stroke.    ROS:  Please see the history of present illness.   Otherwise, review of  systems are positive for none.   All other systems are reviewed and negative.    PHYSICAL EXAM: VS:  BP 111/70 mmHg  Pulse 72  Ht 6' (1.829 m)  Wt 201 lb (91.173 kg)  BMI 27.25 kg/m2  SpO2 95% , BMI Body mass index is 27.25 kg/(m^2). GEN: Well nourished, well developed, in no acute distress HEENT: normal Neck: no JVD, carotid bruits, or masses Cardiac: RRR; soft systolic ejection murmur at base. Respiratory:  clear to auscultation bilaterally, normal work of breathing GI: soft, nontender, nondistended, + BS MS: no deformity or atrophy Skin: warm and dry, no rash Neuro:  Strength and sensation are intact Psych: euthymic mood, full affect   EKG:  EKG is not ordered today.    Recent Labs: 02/05/2015: ALT 19; BUN 20; Creatinine, Ser 1.39; Potassium 4.3; Sodium 138    Lipid Panel    Component Value Date/Time   CHOL 153 02/05/2015 0802   TRIG 154.0* 02/05/2015 0802   HDL 48.30 02/05/2015 0802   CHOLHDL 3 02/05/2015 0802   VLDL 30.8 02/05/2015 0802   LDLCALC 74 02/05/2015 0802   LDLDIRECT 145.5 09/16/2013 0811      Wt Readings from Last 3 Encounters:  02/09/15 201 lb (91.173 kg)  10/01/14 205 lb (92.987 kg)  09/18/14 212 lb (96.163 kg)       ASSESSMENT AND PLAN:  1. Ischemic heart disease with cardial infarction in 2003  with PCI to his LAD and circumflex at that time. Also a prior stent to his right coronary artery. In July 2015 the patient had drug-eluting stent placed in the mid LAD , distal to the previously placed stent which is still widely patent. 2. ischemic cardiomyopathy with ejection fraction 40-45% 3. Hypercholesterolemia 4. aortic valve sclerosis 5. Benign hypertensive heart disease without heart failure 6. Bifascicular block and first-degree AV block asymptomatic 7. Recurrent episodes of abdominal bloating, followed at [facility] and followed by Dr. Cristina Gong. 8.  Paresthesias of fingers of both hands.  He will be seeing a hand specialist Dr. Amedeo Plenty  Plan: Continue current therapy. Recheck in 4 months for office visit, EKG, lipid panel hepatic function panel and basal metabolic panel   Current medicines are reviewed at length with the patient today.  The patient does not have concerns regarding medicines.  The following changes have been made:  no change  Labs/ tests ordered today include:   Orders Placed This Encounter  Procedures  . Lipid panel  . Hepatic function panel  . Basic metabolic panel      Signed, Darlin Coco MD 02/09/2015 10:27 AM    [address], [place], [place]  [address] Phone: [phone]; Fax: [phone]

## 2015-02-09 NOTE — Patient Instructions (Signed)
Medication Instructions:  Your physician recommends that you continue on your current medications as directed. Please refer to the Current Medication list given to you today.  Labwork: None  Testing/Procedures: None  Follow-Up: Your physician wants you to follow-up in: 4 months with fasting labs (lp/bmet/hfp) and ekg  You will receive a reminder letter in the mail two months in advance. If you don't receive a letter, please call our office to schedule the follow-up appointment.

## 2015-03-03 DIAGNOSIS — H40013 Open angle with borderline findings, low risk, bilateral: Secondary | ICD-10-CM | POA: Diagnosis not present

## 2015-03-03 DIAGNOSIS — Z961 Presence of intraocular lens: Secondary | ICD-10-CM | POA: Diagnosis not present

## 2015-03-03 DIAGNOSIS — H349 Unspecified retinal vascular occlusion: Secondary | ICD-10-CM | POA: Diagnosis not present

## 2015-03-03 DIAGNOSIS — H52203 Unspecified astigmatism, bilateral: Secondary | ICD-10-CM | POA: Diagnosis not present

## 2015-03-05 ENCOUNTER — Other Ambulatory Visit: Payer: Self-pay

## 2015-03-05 DIAGNOSIS — H349 Unspecified retinal vascular occlusion: Secondary | ICD-10-CM

## 2015-03-11 ENCOUNTER — Telehealth: Payer: Self-pay | Admitting: *Deleted

## 2015-03-11 DIAGNOSIS — H349 Unspecified retinal vascular occlusion: Secondary | ICD-10-CM

## 2015-03-11 NOTE — Telephone Encounter (Signed)
-----   Message from Deboraha Sprang, MD sent at 03/04/2015 11:46 AM EDT ----- Pt presented with retinal artery occlusion to eye MD   Dr Ellie Lunch  ophthomology called a nd requested carotid dopplers  Could you do and send results to  [place]

## 2015-03-11 NOTE — Telephone Encounter (Signed)
Patient has been scheduled for doppler

## 2015-03-17 ENCOUNTER — Ambulatory Visit (HOSPITAL_COMMUNITY)
Admission: RE | Admit: 2015-03-17 | Discharge: 2015-03-17 | Disposition: A | Discharge status: Home / Self Care | Payer: Medicare Other | Source: Ambulatory Visit | Attending: Cardiology | Admitting: Cardiology

## 2015-03-17 DIAGNOSIS — H349 Unspecified retinal vascular occlusion: Secondary | ICD-10-CM

## 2015-03-17 DIAGNOSIS — I6523 Occlusion and stenosis of bilateral carotid arteries: Secondary | ICD-10-CM | POA: Insufficient documentation

## 2015-04-13 ENCOUNTER — Other Ambulatory Visit: Payer: Self-pay | Admitting: Cardiology

## 2015-04-26 ENCOUNTER — Other Ambulatory Visit: Payer: Self-pay | Admitting: Cardiology

## 2015-04-27 NOTE — Telephone Encounter (Signed)
Gabriel Coco, MD at 02/09/2015 8:46 AM  atorvastatin (LIPITOR) 10 MG tabletTAKE 1 TABLET BY MOUTH EVERY DAY Plan: Continue current therapy. Recheck in 4 months for office visit, EKG, lipid panel hepatic function panel and basal metabolic panel

## 2015-05-20 DIAGNOSIS — M19032 Primary osteoarthritis, left wrist: Secondary | ICD-10-CM | POA: Diagnosis not present

## 2015-05-20 DIAGNOSIS — G5603 Carpal tunnel syndrome, bilateral upper limbs: Secondary | ICD-10-CM | POA: Diagnosis not present

## 2015-05-20 DIAGNOSIS — M19031 Primary osteoarthritis, right wrist: Secondary | ICD-10-CM | POA: Diagnosis not present

## 2015-05-30 DIAGNOSIS — G5603 Carpal tunnel syndrome, bilateral upper limbs: Secondary | ICD-10-CM | POA: Diagnosis not present

## 2015-06-03 ENCOUNTER — Other Ambulatory Visit (INDEPENDENT_AMBULATORY_CARE_PROVIDER_SITE_OTHER): Payer: Medicare Other | Admitting: *Deleted

## 2015-06-03 DIAGNOSIS — I10 Essential (primary) hypertension: Secondary | ICD-10-CM | POA: Diagnosis not present

## 2015-06-03 DIAGNOSIS — E7800 Pure hypercholesterolemia, unspecified: Secondary | ICD-10-CM | POA: Diagnosis not present

## 2015-06-03 DIAGNOSIS — I119 Hypertensive heart disease without heart failure: Secondary | ICD-10-CM | POA: Diagnosis not present

## 2015-06-03 LAB — HEPATIC FUNCTION PANEL
ALT: 21 U/L (ref 9–46)
AST: 22 U/L (ref 10–35)
Albumin: 3.9 g/dL (ref 3.6–5.1)
Alkaline Phosphatase: 39 U/L — ABNORMAL LOW (ref 40–115)
BILIRUBIN DIRECT: 0.2 mg/dL (ref <=0.2)
BILIRUBIN TOTAL: 0.8 mg/dL (ref 0.2–1.2)
Indirect Bilirubin: 0.6 mg/dL (ref 0.2–1.2)
Total Protein: 6.8 g/dL (ref 6.1–8.1)

## 2015-06-03 LAB — BASIC METABOLIC PANEL
BUN: 17 mg/dL (ref 7–25)
CALCIUM: 8.7 mg/dL (ref 8.6–10.3)
CO2: 26 mmol/L (ref 20–31)
CREATININE: 1.14 mg/dL (ref 0.70–1.18)
Chloride: 103 mmol/L (ref 98–110)
GLUCOSE: 106 mg/dL — AB (ref 65–99)
Potassium: 4 mmol/L (ref 3.5–5.3)
Sodium: 138 mmol/L (ref 135–146)

## 2015-06-03 LAB — LIPID PANEL
CHOLESTEROL: 142 mg/dL (ref 125–200)
HDL: 53 mg/dL (ref >=40)
LDL Cholesterol: 62 mg/dL (ref <130)
Total CHOL/HDL Ratio: 2.7 Ratio (ref <=5.0)
Triglycerides: 137 mg/dL (ref <150)
VLDL: 27 mg/dL (ref <30)

## 2015-06-03 NOTE — Addendum Note (Signed)
Addended by: Eulis Foster on: 06/03/2015 08:08 AM   Modules accepted: Orders

## 2015-06-03 NOTE — Progress Notes (Signed)
Quick Note:  Please make copy of labs for patient visit. ______ 

## 2015-06-10 ENCOUNTER — Encounter: Payer: Self-pay | Admitting: Cardiology

## 2015-06-10 ENCOUNTER — Ambulatory Visit (INDEPENDENT_AMBULATORY_CARE_PROVIDER_SITE_OTHER): Payer: Medicare Other | Admitting: Cardiology

## 2015-06-10 VITALS — BP 136/76 | HR 67 | Ht 73.0 in | Wt 203.1 lb

## 2015-06-10 DIAGNOSIS — I358 Other nonrheumatic aortic valve disorders: Secondary | ICD-10-CM

## 2015-06-10 DIAGNOSIS — E7800 Pure hypercholesterolemia, unspecified: Secondary | ICD-10-CM

## 2015-06-10 DIAGNOSIS — I255 Ischemic cardiomyopathy: Secondary | ICD-10-CM | POA: Diagnosis not present

## 2015-06-10 DIAGNOSIS — I119 Hypertensive heart disease without heart failure: Secondary | ICD-10-CM | POA: Diagnosis not present

## 2015-06-10 NOTE — Progress Notes (Signed)
Cardiology Office Note   Date:  06/10/2015   ID:  Gabriel [name], DOB 1934-12-30, MRN [id number]  PCP:  No PCP Per Patient  Cardiologist: Darlin Coco MD  Chief Complaint  Patient presents with  . Coronary Artery Disease       History of Present Illness: Gabriel [name] is a 80 y.o. male who presents for scheduled four-month follow-up visit This pleasant 80 year old gentleman is seen for a scheduled followup office visit. The patient has a history of ischemic heart disease. He had a myocardial infarction in 2003 treated with PCI to his LAD. He also had a stent to his circumflex in 2003. 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 He has a history of hypercholesterolemia and essential hypertension. Patient also has a past history of kidney stones treated with lithotripsy. He tries to drink a lot of water to keep the kidney stones from reforming. The patient presented to [facility] with complaints of chest burning with exertion, including sexual intercourse. This was relieved with rest. Nuclear stress test was performed which demonstrated an anterior, apical reversible defect with on the images. Recent echo 02/04/14 showed mild LVH with focal basal hypertrophy, EF 40-45% with moderate hypokinesis of the midanteroseptal myocardium, increased relative contribution of atrial contraction to ventricular filling, and mild AS/AI. Cardiac cath was recommended. Dr. Irish Lack performed the cardiac catheterization. Catheterization revealed the following: 90% focal stenosis in the mid left anterior descending artery. This was successfully treated with a 2.25 x 12 promus drug-eluting stent, postdilated to 2.6 mm in diameter. Previously placed stent in the more proximal portion of the LAD is widely patent. Previous stents in mLCX and RCA were patent or with mild in stent stenosis. Since his cardiac catheterization he has been  doing well. He has had no recurrent chest pain. He has a lot of breathing problems especially at night. He has seen Dr. Wilburn Cornelia, ENT physician, who has recommended nasal septal surgery. The patient has not decided about surgery. Patient has sleep apnea and uses a CPAP machine. He continues to have a lot of sinus congestion He has chronic intermittent GI problems. He has been seen by Dr. Cristina Gong and also down at [facility]. For a while it appeared that his symptoms had improved on a gluten free and lactose free diet. However now in retrospect he does not think that that change in diet has helped much. Since we last saw him he has seen the hand specialist Dr. Amedeo Plenty who diagnosed bilateral carpal tunnel syndrome.  The patient will require surgery on each wrist.  We will start with the left wrist.  His stent was more than a year ago and he will stop his Plavix 5 days prior to surgery The patient has not been experiencing any recurrent chest pain or angina.  He had had some myalgias related to statin therapy which have improved since he started taking coenzyme Q10. He had had some lightheadedness which has improved since he cut back on the dose of gabapentin for his peripheral neuropathy  Past Medical History  Diagnosis Date  . CAD (coronary artery disease)     a. s/p PTCA 1995. b. MI in 2003 s/p stenting to LAD 2003, s/p stent to Cx 2003. c. prior rotablator to PTCA vessel. d. Abnl nuc 01/2014: s/p DES to LAD, mild-mod nonobstructive disease otherwise.  Marland Kitchen Aortic valve disease     a. Mild AS, AI by echo  01/2014.  Marland Kitchen Hypertension   . Neuropathy ([place])   . Hyperlipidemia     a. Can only tolerate low dose statins.  . Kidney stone   . Insomnia   . Vertigo     Had Epley maneuver, vestibular rehab.  . OSA (obstructive sleep apnea)     with upper airway resistancy  . GERD (gastroesophageal reflux disease)   . 1St degree AV block   . RBBB   . Ischemic cardiomyopathy     a. EF 40-45% by echo 02/04/14,  40-45% by cath 02/05/14 (similar to 2010).    Past Surgical History  Procedure Laterality Date  . Coronary stent placement  2003    LCX  . Cholecystectomy  2009  . Meniscus removal- knee Right   . Shoulder open rotator cuff repair  1990  . Hernia repair  1994    double  . Cataract extraction, bilateral  2004  . Kidney stone surgery  10/2005  . Lithotripsy  10/2005  . Cardiac catheterization  2003    Following MI  . Coronary angioplasty  2003    LAD  . Left heart cath  02/05/2014  . Left heart catheterization with coronary angiogram N/A 02/05/2014    Procedure: LEFT HEART CATHETERIZATION WITH CORONARY ANGIOGRAM;  Surgeon: Jettie Booze, MD;  Location: [facility] CATH LAB;  Service: Cardiovascular;  Laterality: N/A;  . Percutaneous coronary stent intervention (pci-s) Right 02/05/2014    Procedure: PERCUTANEOUS CORONARY STENT INTERVENTION (PCI-S);  Surgeon: Jettie Booze, MD;  Location: [facility] CATH LAB;  Service: Cardiovascular;  Laterality: Right;     Current Outpatient Prescriptions  Medication Sig Dispense Refill  . amLODipine (NORVASC) 5 MG tablet TAKE 1 TABLET (5 MG TOTAL) BY MOUTH DAILY. 90 tablet 1  . aspirin EC 81 MG tablet Take 81 mg by mouth daily.    Marland Kitchen atorvastatin (LIPITOR) 10 MG tablet TAKE 1 TABLET BY MOUTH EVERY DAY 90 tablet 2  . clopidogrel (PLAVIX) 75 MG tablet TAKE 1 TABLET BY MOUTH DAILY. 90 tablet 1  . Coenzyme Q10 (CO Q 10 PO) Take 1 capsule by mouth daily.    . fluticasone (FLONASE) 50 MCG/ACT nasal spray Place 2 sprays into both nostrils as needed (allergies/rhinitis).   11  . gabapentin (NEURONTIN) 800 MG tablet TAKE 1 TABLET (800 MG TOTAL) BY MOUTH 3 (THREE) TIMES DAILY. 270 tablet 2  . lisinopril (PRINIVIL,ZESTRIL) 20 MG tablet Take 1 tablet (20 mg total) by mouth daily. 30 tablet 9  . loratadine (CLARITIN) 10 MG tablet Take 10 mg by mouth daily as needed. For allergies.    . metoprolol succinate (TOPROL-XL) 25 MG 24 hr tablet TAKE 1 TABLET BY MOUTH DAILY 90  tablet 3  . Multiple Vitamins-Minerals (MULTIVITAMINS THER. W/MINERALS) TABS Take 1 tablet by mouth daily.    . nitroGLYCERIN (NITROSTAT) 0.4 MG SL tablet Place 0.4 mg under the tongue every 5 (five) minutes as needed for chest pain.    . Omega-3 Fatty Acids (OMEGA 3 PO) Take 1 tablet by mouth daily.    . sildenafil (REVATIO) 20 MG tablet Take 40 mg by mouth daily as needed (erectile dysfunction).    Marland Kitchen ZETIA 10 MG tablet TAKE 1 TABLET (10 MG TOTAL) BY MOUTH DAILY. 90 tablet 2  . zolpidem (AMBIEN) 10 MG tablet Take 1 tablet (10 mg total) by mouth at bedtime as needed for sleep. 30 tablet 5   No current facility-administered medications for this visit.    Allergies:   Lipitor and  Mevacor    Social History:  The patient  reports that he has quit smoking. He has never used smokeless tobacco. He reports that he drinks alcohol. He reports that he does not use illicit drugs.   Family History:  The patient's family history includes Heart Problems in his mother; Other in his sister; Parkinson's disease (age of onset: 80) in his father. There is no history of Heart attack or Stroke.    ROS:  Please see the history of present illness.   Otherwise, review of systems are positive for none.   All other systems are reviewed and negative.    PHYSICAL EXAM: VS:  BP 136/76 mmHg  Pulse 67  Ht 6\' 1"  (1.854 m)  Wt 203 lb 1.9 oz (92.135 kg)  BMI 26.80 kg/m2 , BMI Body mass index is 26.8 kg/(m^2). GEN: Well nourished, well developed, in no acute distress HEENT: normal Neck: no JVD, carotid bruits, or masses Cardiac: RRR; no murmurs, rubs, or gallops,no edema  Respiratory:  clear to auscultation bilaterally, normal work of breathing GI: soft, nontender, nondistended, + BS MS: no deformity or atrophy Skin: warm and dry, no rash Neuro:  Strength and sensation are intact Psych: euthymic mood, full affect   EKG:  EKG is ordered today. The ekg ordered today demonstrates normal sinus rhythm with  first-degree AV block.  Right bundle branch block with left anterior fascicular block.  Since prior tracing of 06/03/15, no significant change   Recent Labs: 06/03/2015: ALT 21; BUN 17; Creat 1.14; Potassium 4.0; Sodium 138    Lipid Panel    Component Value Date/Time   CHOL 142 06/03/2015 0808   TRIG 137 06/03/2015 0808   HDL 53 06/03/2015 0808   CHOLHDL 2.7 06/03/2015 0808   VLDL 27 06/03/2015 0808   LDLCALC 62 06/03/2015 0808   LDLDIRECT 145.5 09/16/2013 0811      Wt Readings from Last 3 Encounters:  06/10/15 203 lb 1.9 oz (92.135 kg)  02/09/15 201 lb (91.173 kg)  10/01/14 205 lb (92.987 kg)         ASSESSMENT AND PLAN:  1. Ischemic heart disease with cardial infarction in 2003 with PCI to his LAD and circumflex at that time. Also a prior stent to his right coronary artery. In July 2015 the patient had drug-eluting stent placed in the mid LAD , distal to the previously placed stent which is still widely patent. 2. ischemic cardiomyopathy with ejection fraction 40-45% 3. Hypercholesterolemia 4. aortic valve sclerosis 5. Benign hypertensive heart disease without heart failure 6. Bifascicular block and first-degree AV block asymptomatic 7. Recurrent episodes of abdominal bloating, followed at [facility] and followed by Dr. Cristina Gong. 8. Bilateral carpal tunnel syndrome, surgery scheduled for 06/23/15 on the left wrist.  We will give cardiology clearance today.  He can leave off his Plavix for 5 days prior to surgery.  Plan: Continue current therapy. Recheck in 4 months for office visit, EKG, lipid panel hepatic function panel and basal metabolic panel   Current medicines are reviewed at length with the patient today.  The patient does not have concerns regarding medicines.  The following changes have been made:  no change  Labs/ tests ordered today include:  No orders of the defined types were placed in this encounter.   Disposition continue current medication.   Recheck in 4 months for office visit and lab work with Dr. Acie Fredrickson   Signed, Darlin Coco MD 06/10/2015 10:26 AM    [place] [address] [place], [place]  [address] Phone: [phone]; Fax: [phone]

## 2015-06-10 NOTE — Patient Instructions (Signed)
Medication Instructions:  Your physician recommends that you continue on your current medications as directed. Please refer to the Current Medication list given to you today.  Labwork: NONE  Testing/Procedures: NONE  Follow-Up: Your physician recommends that you schedule a follow-up appointment in: 4 months with fasting labs (lp/bmet/hfp) WITH DR Acie Fredrickson   If you need a refill on your cardiac medications before your next appointment, please call your pharmacy.

## 2015-06-15 ENCOUNTER — Other Ambulatory Visit: Payer: Self-pay | Admitting: Cardiology

## 2015-06-15 DIAGNOSIS — G4700 Insomnia, unspecified: Secondary | ICD-10-CM

## 2015-06-15 NOTE — Telephone Encounter (Signed)
OK to refill Ambien

## 2015-06-23 DIAGNOSIS — G5602 Carpal tunnel syndrome, left upper limb: Secondary | ICD-10-CM | POA: Diagnosis not present

## 2015-07-01 DIAGNOSIS — Z4789 Encounter for other orthopedic aftercare: Secondary | ICD-10-CM | POA: Diagnosis not present

## 2015-07-02 ENCOUNTER — Other Ambulatory Visit: Payer: Self-pay | Admitting: Cardiology

## 2015-07-06 NOTE — Addendum Note (Signed)
Addended by: Freada Bergeron on: 07/06/2015 05:28 PM   Modules accepted: Orders

## 2015-07-07 DIAGNOSIS — M25642 Stiffness of left hand, not elsewhere classified: Secondary | ICD-10-CM | POA: Diagnosis not present

## 2015-07-08 ENCOUNTER — Other Ambulatory Visit: Payer: Self-pay | Admitting: *Deleted

## 2015-07-08 MED ORDER — NITROGLYCERIN 0.4 MG SL SUBL: 0.4000 mg | SUBLINGUAL_TABLET | SUBLINGUAL | Status: DC | PRN

## 2015-07-08 MED ORDER — AMLODIPINE BESYLATE 5 MG PO TABS: 5.0000 mg | ORAL_TABLET | Freq: Every day | ORAL | Status: DC

## 2015-07-08 MED ORDER — CLOPIDOGREL BISULFATE 75 MG PO TABS: 75.0000 mg | ORAL_TABLET | Freq: Every day | ORAL | Status: DC

## 2015-07-20 DIAGNOSIS — Z4789 Encounter for other orthopedic aftercare: Secondary | ICD-10-CM | POA: Diagnosis not present

## 2015-07-21 DIAGNOSIS — M25642 Stiffness of left hand, not elsewhere classified: Secondary | ICD-10-CM | POA: Diagnosis not present

## 2015-07-23 ENCOUNTER — Ambulatory Visit (INDEPENDENT_AMBULATORY_CARE_PROVIDER_SITE_OTHER): Payer: Medicare Other | Admitting: Primary Care

## 2015-07-23 ENCOUNTER — Encounter: Payer: Self-pay | Admitting: Primary Care

## 2015-07-23 ENCOUNTER — Other Ambulatory Visit: Payer: Self-pay | Admitting: Cardiology

## 2015-07-23 VITALS — BP 136/70 | HR 76 | Temp 98.1°F | Ht 73.0 in | Wt 210.0 lb

## 2015-07-23 DIAGNOSIS — G629 Polyneuropathy, unspecified: Secondary | ICD-10-CM

## 2015-07-23 DIAGNOSIS — E7800 Pure hypercholesterolemia, unspecified: Secondary | ICD-10-CM

## 2015-07-23 DIAGNOSIS — I10 Essential (primary) hypertension: Secondary | ICD-10-CM

## 2015-07-23 DIAGNOSIS — G4700 Insomnia, unspecified: Secondary | ICD-10-CM | POA: Diagnosis not present

## 2015-07-23 DIAGNOSIS — I255 Ischemic cardiomyopathy: Secondary | ICD-10-CM | POA: Diagnosis not present

## 2015-07-23 NOTE — Telephone Encounter (Signed)
clopidogrel (PLAVIX) 75 MG tablet  Medication   Date: 07/08/2015  Department: [address] Office  Ordering/Authorizing: Darlin Coco, MD      Order Providers    Prescribing Provider Encounter Provider   Darlin Coco, MD Dannette Barbara, CMA    Medication Detail      Disp Refills Start End     clopidogrel (PLAVIX) 75 MG tablet 90 tablet 1 07/08/2015     Sig - Route: Take 1 tablet (75 mg total) by mouth daily. - Oral    E-Prescribing Status: Receipt confirmed by pharmacy (07/08/2015 3:42 PM EST)     Pharmacy    [facility], [place]   Already filled.

## 2015-07-23 NOTE — Progress Notes (Signed)
Subjective:    Patient ID: REESE MILAND, male    DOB: 09-09-1934, 80 y.o.   MRN: [id number]  HPI  Mr. Marks is a 80 year old male who presents today to establish care and discuss the problems mentioned below. Will review old records. His last MWV was years ago.   1) Essential Hypertension: Currently managed on amlodipine 5 mg, lisinopril 20 mg, Toprol XL 25 mg. Denies chest pain, shortness of breath, headaches.  2) CAD: PTCA in 1995, Delta in 2003 with stent to LAD, angioplasty with stent in July 2015, abnormal nuclear stress test in July 2015. Currently managed on atorvastatin 10 mg, aspirin 81 mg, Toprol XL, Zetia 10 mg. Currently follows with Dr. Mare Ferrari.   3) Hyerlipidemia: Currently managed on Zetia 10 mg and atorvastatin 10 mg. Follows with cardiology who is scheduled to repeat lipids in March 2017.  4) Neuropathy: Located to bilateral lower extremities. Currently managed on Gabapentin 800 mg TID which helps him to tolerate his symptoms well. Does not have history of diabetes. Unsure of cause for his neuropathy.   5) Insomnia: Currently managed on Ambien 10 mg and has been on for years. Also with history of OSA and sleeps with CPAP machine. He feels well managed at this dose.  Review of Systems  Constitutional: Negative for unexpected weight change.  HENT: Negative for rhinorrhea.   Respiratory: Negative for cough and shortness of breath.   Cardiovascular: Negative for chest pain.  Gastrointestinal: Negative for diarrhea and constipation.  Genitourinary: Negative for difficulty urinating.  Musculoskeletal: Negative for myalgias and arthralgias.       History of carpal tunnel. Chronic knee pain.  Skin: Negative for rash.  Allergic/Immunologic: Positive for environmental allergies.  Neurological: Positive for numbness. Negative for dizziness and headaches.  Psychiatric/Behavioral:       Denies concerns for anxiety or depression       Past Medical History  Diagnosis Date    . CAD (coronary artery disease)     a. s/p PTCA 1995. b. MI in 2003 s/p stenting to LAD 2003, s/p stent to Cx 2003. c. prior rotablator to PTCA vessel. d. Abnl nuc 01/2014: s/p DES to LAD, mild-mod nonobstructive disease otherwise.  Marland Kitchen Aortic valve disease     a. Mild AS, AI by echo 01/2014.  Marland Kitchen Hypertension   . Neuropathy ([place])   . Hyperlipidemia     a. Can only tolerate low dose statins.  . Kidney stone   . Insomnia   . Vertigo     Had Epley maneuver, vestibular rehab.  . OSA (obstructive sleep apnea)     with upper airway resistancy  . GERD (gastroesophageal reflux disease)   . 1St degree AV block   . RBBB   . Ischemic cardiomyopathy     a. EF 40-45% by echo 02/04/14, 40-45% by cath 02/05/14 (similar to 2010).  . Carpal tunnel syndrome     Social History   Social History  . Marital Status: Married    Spouse Name: [name]  . Number of Children: 6  . Years of Education: hs   Occupational History  .     Social History Main Topics  . Smoking status: Former Research scientist (life sciences)  . Smokeless tobacco: Never Used     Comment: 1968  . Alcohol Use: 0.0 oz/week    0 Standard drinks or equivalent per week     Comment: rarely  . Drug Use: No  . Sexual Activity: Yes   Other Topics Concern  .  Not on file   Social History Narrative   Patient is married [name]).   Patient has four biological children and two adopted children.   Patient is retired from Electronics engineer.   Patient has a high school education.   Patient drinks coffee (12oz) daily.   Patient is right-handed.   Enjoys traveling, working on projects.                 Past Surgical History  Procedure Laterality Date  . Coronary stent placement  2003    LCX  . Cholecystectomy  2009  . Meniscus removal- knee Right   . Shoulder open rotator cuff repair  1990  . Hernia repair  1994    double  . Cataract extraction, bilateral  2004  . Kidney stone surgery  10/2005  . Lithotripsy  10/2005  . Cardiac  catheterization  2003    Following MI  . Coronary angioplasty  2003    LAD  . Left heart cath  02/05/2014  . Left heart catheterization with coronary angiogram N/A 02/05/2014    Procedure: LEFT HEART CATHETERIZATION WITH CORONARY ANGIOGRAM;  Surgeon: Jettie Booze, MD;  Location: [facility] CATH LAB;  Service: Cardiovascular;  Laterality: N/A;  . Percutaneous coronary stent intervention (pci-s) Right 02/05/2014    Procedure: PERCUTANEOUS CORONARY STENT INTERVENTION (PCI-S);  Surgeon: Jettie Booze, MD;  Location: [facility] CATH LAB;  Service: Cardiovascular;  Laterality: Right;    Family History  Problem Relation Age of Onset  . Heart Problems Mother   . Parkinson's disease Father 25  . Other Sister     Muscular degeneration  . Heart attack Neg Hx   . Stroke Neg Hx   . Diabetes Mother     Allergies  Allergen Reactions  . Lipitor [Atorvastatin Calcium] Other (See Comments)    Muscle pain--Pt stated he can tolerate a low dose  . Mevacor [Lovastatin] Other (See Comments)    "makes me feel loopy"    Current Outpatient Prescriptions on File Prior to Visit  Medication Sig Dispense Refill  . amLODipine (NORVASC) 5 MG tablet Take 1 tablet (5 mg total) by mouth daily. 90 tablet 1  . aspirin EC 81 MG tablet Take 81 mg by mouth daily.    Marland Kitchen atorvastatin (LIPITOR) 10 MG tablet TAKE 1 TABLET BY MOUTH EVERY DAY 90 tablet 2  . clopidogrel (PLAVIX) 75 MG tablet Take 1 tablet (75 mg total) by mouth daily. 90 tablet 1  . Coenzyme Q10 (CO Q 10 PO) Take 1 capsule by mouth daily.    . fluticasone (FLONASE) 50 MCG/ACT nasal spray Place 2 sprays into both nostrils as needed (allergies/rhinitis). Reported on 07/23/2015  11  . gabapentin (NEURONTIN) 800 MG tablet TAKE 1 TABLET (800 MG TOTAL) BY MOUTH 3 (THREE) TIMES DAILY. 270 tablet 2  . lisinopril (PRINIVIL,ZESTRIL) 20 MG tablet Take 1 tablet (20 mg total) by mouth daily. 30 tablet 9  . loratadine (CLARITIN) 10 MG tablet Take 10 mg by mouth daily as needed.  For allergies.    . metoprolol succinate (TOPROL-XL) 25 MG 24 hr tablet TAKE 1 TABLET BY MOUTH DAILY 90 tablet 3  . Multiple Vitamins-Minerals (MULTIVITAMINS THER. W/MINERALS) TABS Take 1 tablet by mouth daily.    . nitroGLYCERIN (NITROSTAT) 0.4 MG SL tablet Place 1 tablet (0.4 mg total) under the tongue every 5 (five) minutes as needed for chest pain. 30 tablet 1  . Omega-3 Fatty Acids (OMEGA 3 PO) Take 1 tablet by mouth daily.    Marland Kitchen  sildenafil (REVATIO) 20 MG tablet Take 40 mg by mouth daily as needed (erectile dysfunction).    Marland Kitchen ZETIA 10 MG tablet TAKE 1 TABLET (10 MG TOTAL) BY MOUTH DAILY. 90 tablet 2  . zolpidem (AMBIEN) 10 MG tablet TAKE 1 TABLET BY MOUTH AT BEDTIME AS NEEDED 30 tablet 5   No current facility-administered medications on file prior to visit.    BP 136/70 mmHg  Pulse 76  Temp(Src) 98.1 F (36.7 C) (Oral)  Ht 6\' 1"  (1.854 m)  Wt 210 lb (95.255 kg)  BMI 27.71 kg/m2  SpO2 95%    Objective:   Physical Exam  Constitutional: He is oriented to person, place, and time. He appears well-nourished.  Cardiovascular: Normal rate and regular rhythm.   Pulmonary/Chest: Effort normal and breath sounds normal.  Neurological: He is alert and oriented to person, place, and time.  Skin: Skin is warm and dry.  Psychiatric: He has a normal mood and affect.          Assessment & Plan:

## 2015-07-23 NOTE — Assessment & Plan Note (Signed)
Stable today. Continue current regimen. 

## 2015-07-23 NOTE — Assessment & Plan Note (Signed)
Currently managed on atorvastatin 10 mg and zetia 10 mg.  Denies myalgias. Follow up lipid panel due in march 2017 per patient.

## 2015-07-23 NOTE — Assessment & Plan Note (Signed)
Follows with cardiology every 4 months. Denies chest pain, SOB.

## 2015-07-23 NOTE — Patient Instructions (Signed)
Please schedule a Medicare Wellness Visit in 6 months.   Please notify me when you are needing refills of your medications.  It was a pleasure to meet you today! Please don't hesitate to call me with any questions. Welcome to [facility]!

## 2015-07-23 NOTE — Assessment & Plan Note (Signed)
Currently managed on gabapentin 800 mg TID which does well to reduce symptoms. Non diabetic.

## 2015-07-23 NOTE — Progress Notes (Signed)
Pre visit review using our clinic review tool, if applicable. No additional management support is needed unless otherwise documented below in the visit note. 

## 2015-07-23 NOTE — Assessment & Plan Note (Signed)
Sleeps with CPAP and historically has difficulty falling asleep. Managed on ambien 10 mg from prior PCP for years and feels well on this medication. Continue same.

## 2015-07-24 DIAGNOSIS — M25642 Stiffness of left hand, not elsewhere classified: Secondary | ICD-10-CM | POA: Diagnosis not present

## 2015-07-28 DIAGNOSIS — M25642 Stiffness of left hand, not elsewhere classified: Secondary | ICD-10-CM | POA: Diagnosis not present

## 2015-07-29 ENCOUNTER — Encounter: Payer: Self-pay | Admitting: *Deleted

## 2015-07-31 DIAGNOSIS — M25642 Stiffness of left hand, not elsewhere classified: Secondary | ICD-10-CM | POA: Diagnosis not present

## 2015-08-04 DIAGNOSIS — M25642 Stiffness of left hand, not elsewhere classified: Secondary | ICD-10-CM | POA: Diagnosis not present

## 2015-08-07 DIAGNOSIS — M25642 Stiffness of left hand, not elsewhere classified: Secondary | ICD-10-CM | POA: Diagnosis not present

## 2015-08-11 NOTE — Addendum Note (Signed)
Addended by: Jacqualin Combes on: 08/11/2015 03:28 PM   Modules accepted: Medications

## 2015-08-14 DIAGNOSIS — M25642 Stiffness of left hand, not elsewhere classified: Secondary | ICD-10-CM | POA: Diagnosis not present

## 2015-09-18 ENCOUNTER — Ambulatory Visit: Payer: Medicare Other | Admitting: Neurology

## 2015-09-21 ENCOUNTER — Encounter: Payer: Self-pay | Admitting: Neurology

## 2015-09-21 ENCOUNTER — Ambulatory Visit (INDEPENDENT_AMBULATORY_CARE_PROVIDER_SITE_OTHER): Payer: Medicare Other | Admitting: Neurology

## 2015-09-21 VITALS — BP 140/72 | HR 68 | Resp 20 | Ht 73.0 in | Wt 211.0 lb

## 2015-09-21 DIAGNOSIS — G609 Hereditary and idiopathic neuropathy, unspecified: Secondary | ICD-10-CM | POA: Diagnosis not present

## 2015-09-21 DIAGNOSIS — G5603 Carpal tunnel syndrome, bilateral upper limbs: Secondary | ICD-10-CM | POA: Diagnosis not present

## 2015-09-21 DIAGNOSIS — Z9989 Dependence on other enabling machines and devices: Principal | ICD-10-CM

## 2015-09-21 DIAGNOSIS — G4733 Obstructive sleep apnea (adult) (pediatric): Secondary | ICD-10-CM | POA: Diagnosis not present

## 2015-09-21 NOTE — Patient Instructions (Signed)
Neuropathic Pain Neuropathic pain is pain caused by damage to the nerves that are responsible for certain sensations in your body (sensory nerves). The pain can be caused by damage to:   The sensory nerves that send signals to your spinal cord and brain (peripheral nervous system).  The sensory nerves in your brain or spinal cord (central nervous system). Neuropathic pain can make you more sensitive to pain. What would be a minor sensation for most people may feel very painful if you have neuropathic pain. This is usually a long-term condition that can be difficult to treat. The type of pain can differ from person to person. It may start suddenly (acute), or it may develop slowly and last for a long time (chronic). Neuropathic pain may come and go as damaged nerves heal or may stay at the same level for years. It often causes emotional distress, loss of sleep, and a lower quality of life. CAUSES  The most common cause of damage to a sensory nerve is diabetes. Many other diseases and conditions can also cause neuropathic pain. Causes of neuropathic pain can be classified as:  Toxic. Many drugs and chemicals can cause toxic damage. The most common cause of toxic neuropathic pain is damage from drug treatment for cancer (chemotherapy).  Metabolic. This type of pain can happen when a disease causes imbalances that damage nerves. Diabetes is the most common of these diseases. Vitamin B deficiency caused by long-term alcohol abuse is another common cause.  Traumatic. Any injury that cuts, crushes, or stretches a nerve can cause damage and pain. A common example is feeling pain after losing an arm or leg (phantom limb pain).  Compression-related. If a sensory nerve gets trapped or compressed for a long period of time, the blood supply to the nerve can be cut off.  Vascular. Many blood vessel diseases can cause neuropathic pain by decreasing blood supply and oxygen to nerves.  Autoimmune. This type of  pain results from diseases in which the body's defense system mistakenly attacks sensory nerves. Examples of autoimmune diseases that can cause neuropathic pain include lupus and multiple sclerosis.  Infectious. Many types of viral infections can damage sensory nerves and cause pain. Shingles infection is a common cause of this type of pain.  Inherited. Neuropathic pain can be a symptom of many diseases that are passed down through families (genetic). SIGNS AND SYMPTOMS  The main symptom is pain. Neuropathic pain is often described as:  Burning.  Shock-like.  Stinging.  Hot or cold.  Itching. DIAGNOSIS  No single test can diagnose neuropathic pain. Your health care provider will do a physical exam and ask you about your pain. You may use a pain scale to describe how bad your pain is. You may also have tests to see if you have a high sensitivity to pain and to help find the cause and location of any sensory nerve damage. These tests may include:  Imaging studies, such as:  X-rays.  CT scan.  MRI.  Nerve conduction studies to test how well nerve signals travel through your sensory nerves (electrodiagnostic testing).  Stimulating your sensory nerves through electrodes on your skin and measuring the response in your spinal cord and brain (somatosensory evoked potentials). TREATMENT  Treatment for neuropathic pain may change over time. You may need to try different treatment options or a combination of treatments. Some options include:  Over-the-counter pain relievers.  Prescription medicines. Some medicines used to treat other conditions may also help neuropathic pain. These   include medicines to:  Control seizures (anticonvulsants).  Relieve depression (antidepressants).  Prescription-strength pain relievers (narcotics). These are usually used when other pain relievers do not help.  Transcutaneous nerve stimulation (TENS). This uses electrical currents to block painful nerve  signals. The treatment is painless.  Topical and local anesthetics. These are medicines that numb the nerves. They can be injected as a nerve block or applied to the skin.  Alternative treatments, such as:  Acupuncture.  Meditation.  Massage.  Physical therapy.  Pain management programs.  Counseling. HOME CARE INSTRUCTIONS  Learn as much as you can about your condition.  Take medicines only as directed by your health care provider.  Work closely with all your health care providers to find what works best for you.  Have a good support system at home.  Consider joining a chronic pain support group. SEEK MEDICAL CARE IF:  Your pain treatments are not helping.  You are having side effects from your medicines.  You are struggling with fatigue, mood changes, depression, or anxiety.   This information is not intended to replace advice given to you by your health care provider. Make sure you discuss any questions you have with your health care provider.   Document Released: 04/14/2004 Document Revised: 08/08/2014 Document Reviewed: 12/26/2013 Elsevier Interactive Patient Education 2016 [employer]  

## 2015-09-21 NOTE — Progress Notes (Signed)
[facility]  Provider:  Larey Seat, M D  Referring Provider: Darlin Coco, MD Primary Care Physician:  Sheral Flow, NP    OSA on CPAP :   HPI:  Gabriel [name] is a 81 y.o. male  Is seen here as a  revisit  from Dr. Mare Ferrari , his cardiologist , for a CPAP follow up.  Gabriel [name] has been followed in this clinic since 2012 he underwent a sleep study, ordered by Dr. Mare Ferrari, on 04-19-11.  At that time he has a past medical history of coronary artery disease, optic valve disease, peripheral neuropathy and hypertension.  He had mostly complained of restless legs and witnessed apneas. The patient was diagnosed with an AHI of 24.9 and an RDI of 28.5. There was a clear accentuation of apnea during REM sleep - the REM AHI being 48.9,  supine AHI was also elevated at 40 over the nonsupine AHI of of 14. Nadir of  oxygen saturation was 77% and 59 minutes of desaturations total time.  The patient returned for a titration study and was titrated to 6 cm water pressure and 2 centimeter EPR. He had frequent sinusitis before CPAP use, and not since. He is not longer snoring. He may have one nocturia if any at all. Overall sleep duration is 7 hours at night , occasional naps in the afternoon.   09-18-14 CPAP download shows the patient using the machine 7 hours and 28 minutes nightly, with a residual AHI of 3.0 , a moderate air leak.  The patient uses a nasal pillow with heated humidity. He has a 100% compliance for CPAP use above 4 hours nightly user time.  Out of 90 days the machine was used 90 days, exemplary user.  His restless legs have been asymptomatic - he had PLMs , no longer RLS.   The pets states that he feels he can breathe much better once he starts using CPAP otherwise he often has an congested nose he also has trouble with repeated sinusitis infections, and he has spoken to Dr. Wilburn Cornelia who diagnosed him with a deviated septum and wanted me to discuss today  with the patient as this could improve his overall nasal airflow as well as the CPAP compliance or tolerance. I think , the patient is obviously doing very well with his CPAP as is but he has discomfort and frequent  Nasal discharges he also complains of a postnasal drip. He could benefit indeed from an septal correction surgery and perhaps an adenoid trimming.  09-21-15 Patient here for yearly Rv on CPAP, compliance documentation. [name] is seen here today and be discussed the compliance report for CPAP obtained in the office which includes last night's data. He uses the machine on average 7 hours and 28 minutes nightly has a 97% compliance for over 4 hours of daily use there was truly only one day over the last 30 days was user time was shorter than 4 hours. His residual AHI is 4.4 he does have some significant air leaks and typically there is a higher residual AHI on week ends. That may be related to intake of alcohol, if they go out. He doesn't drink wine regularly and not at home.  No caffeine in the evening.    Review of Systems: 09-21-15  Out of a complete 14 system review, the patient complains of only the following symptoms, and all other reviewed systems are negative. The patient endorsed the geriatric depression score at 0 points, the  restless leg score at mild degree of impairment. FSS at  from 28 and Epworth at 6 points.   Social History   Social History  . Marital Status: Married    Spouse Name: [name]  . Number of Children: 6  . Years of Education: hs   Occupational History  .     Social History Main Topics  . Smoking status: Former Research scientist (life sciences)  . Smokeless tobacco: Never Used     Comment: 1968  . Alcohol Use: 0.0 oz/week    0 Standard drinks or equivalent per week     Comment: rarely  . Drug Use: No  . Sexual Activity: Yes   Other Topics Concern  . Not on file   Social History Narrative   Patient is married [name]).   Patient has four biological children and two  adopted children.   Patient is retired from Electronics engineer.   Patient has a high school education.   Patient drinks coffee (12oz) daily.   Patient is right-handed.   Enjoys traveling, working on projects.                 Family History  Problem Relation Age of Onset  . Heart Problems Mother   . Parkinson's disease Father 79  . Other Sister     Muscular degeneration  . Heart attack Neg Hx   . Stroke Neg Hx   . Diabetes Mother     Past Medical History  Diagnosis Date  . CAD (coronary artery disease)     a. s/p PTCA 1995. b. MI in 2003 s/p stenting to LAD 2003, s/p stent to Cx 2003. c. prior rotablator to PTCA vessel. d. Abnl nuc 01/2014: s/p DES to LAD, mild-mod nonobstructive disease otherwise.  Marland Kitchen Aortic valve disease     a. Mild AS, AI by echo 01/2014.  Marland Kitchen Hypertension   . Neuropathy ([place])   . Hyperlipidemia     a. Can only tolerate low dose statins.  . Kidney stone   . Insomnia   . Vertigo     Had Epley maneuver, vestibular rehab.  . OSA (obstructive sleep apnea)     with upper airway resistancy  . GERD (gastroesophageal reflux disease)   . 1St degree AV block   . RBBB   . Ischemic cardiomyopathy     a. EF 40-45% by echo 02/04/14, 40-45% by cath 02/05/14 (similar to 2010).  . Carpal tunnel syndrome     Past Surgical History  Procedure Laterality Date  . Coronary stent placement  2003    LCX  . Cholecystectomy  2009  . Meniscus removal- knee Right   . Shoulder open rotator cuff repair  1990  . Hernia repair  1994    double  . Cataract extraction, bilateral  2004  . Kidney stone surgery  10/2005  . Lithotripsy  10/2005  . Cardiac catheterization  2003    Following MI  . Coronary angioplasty  2003    LAD  . Left heart cath  02/05/2014  . Left heart catheterization with coronary angiogram N/A 02/05/2014    Procedure: LEFT HEART CATHETERIZATION WITH CORONARY ANGIOGRAM;  Surgeon: Jettie Booze, MD;  Location: [facility] CATH LAB;  Service:  Cardiovascular;  Laterality: N/A;  . Percutaneous coronary stent intervention (pci-s) Right 02/05/2014    Procedure: PERCUTANEOUS CORONARY STENT INTERVENTION (PCI-S);  Surgeon: Jettie Booze, MD;  Location: [facility] CATH LAB;  Service: Cardiovascular;  Laterality: Right;    Current Outpatient Prescriptions  Medication Sig  Dispense Refill  . amLODipine (NORVASC) 5 MG tablet Take 1 tablet (5 mg total) by mouth daily. 90 tablet 1  . Ascorbic Acid (VITAMIN C) 1000 MG tablet Take 1,000 mg by mouth daily.    Marland Kitchen aspirin EC 81 MG tablet Take 81 mg by mouth daily.    Marland Kitchen atorvastatin (LIPITOR) 10 MG tablet TAKE 1 TABLET BY MOUTH EVERY DAY 90 tablet 2  . clopidogrel (PLAVIX) 75 MG tablet Take 1 tablet (75 mg total) by mouth daily. 90 tablet 1  . Coenzyme Q10 (CO Q 10 PO) Take 200 mg by mouth daily.     . fluticasone (FLONASE) 50 MCG/ACT nasal spray Place 2 sprays into both nostrils as needed (allergies/rhinitis). Reported on 07/23/2015  11  . gabapentin (NEURONTIN) 800 MG tablet TAKE 1 TABLET (800 MG TOTAL) BY MOUTH 3 (THREE) TIMES DAILY. 270 tablet 2  . lisinopril (PRINIVIL,ZESTRIL) 20 MG tablet Take 1 tablet (20 mg total) by mouth daily. 30 tablet 9  . loratadine (CLARITIN) 10 MG tablet Take 10 mg by mouth daily as needed. For allergies.    . Magnesium 500 MG TABS Take 500 mg by mouth daily.    . metoprolol succinate (TOPROL-XL) 25 MG 24 hr tablet TAKE 1 TABLET BY MOUTH DAILY 90 tablet 3  . Multiple Vitamins-Minerals (MULTIVITAMINS THER. W/MINERALS) TABS Take 1 tablet by mouth daily.    . nitroGLYCERIN (NITROSTAT) 0.4 MG SL tablet Place 1 tablet (0.4 mg total) under the tongue every 5 (five) minutes as needed for chest pain. 30 tablet 1  . Omega-3 Fatty Acids (OMEGA 3 PO) Take 1 tablet by mouth daily.    . Probiotic Product (PROBIOTIC PO) Take 10 mg by mouth daily.    . Pyridoxine HCl (VITAMIN B6) 200 MG TABS Take 200 mg by mouth daily.    . sildenafil (REVATIO) 20 MG tablet Take 40 mg by mouth daily as  needed (erectile dysfunction).    . Wheat Dextrin (BENEFIBER PO) Take 5 g by mouth daily.    Marland Kitchen ZETIA 10 MG tablet TAKE 1 TABLET (10 MG TOTAL) BY MOUTH DAILY. 90 tablet 2  . zolpidem (AMBIEN) 10 MG tablet TAKE 1 TABLET BY MOUTH AT BEDTIME AS NEEDED 30 tablet 5   No current facility-administered medications for this visit.    Allergies as of 09/21/2015 - Review Complete 09/21/2015  Allergen Reaction Noted  . Lipitor [atorvastatin calcium] Other (See Comments) 08/30/2010  . Mevacor [lovastatin] Other (See Comments) 08/30/2010    Vitals: BP 140/72 mmHg  Pulse 68  Resp 20  Ht 6\' 1"  (1.854 m)  Wt 211 lb (95.709 kg)  BMI 27.84 kg/m2 Last Weight:  Wt Readings from Last 1 Encounters:  09/21/15 211 lb (95.709 kg)   Last Height:   Ht Readings from Last 1 Encounters:  09/21/15 6\' 1"  (1.854 m)     Physical exam:  General: The patient is awake, alert and appears not in acute distress. The patient is well groomed. Head: Normocephalic, atraumatic. Neck is supple. Mallampati 3 , neck circumference:15.25  inches  Cardiovascular:  Regular rate and rhythm, without  murmurs or carotid bruit, and without distended neck veins. Respiratory: Lungs are clear to auscultation. Skin:  Without evidence of edema, or rash Trunk: BMI is elevated , the patient has normal posture, no scoliosis.  Neurologic exam : The patient is awake and alert, oriented to place and time. Memory subjective  described as intact. There is a normal attention span & concentration ability.  Speech  is fluent without  dysarthria, dysphonia or aphasia. Mood and affect are appropriate.  Cranial nerves: Pupils are equal and briskly reactive to light. Funduscopic exam without evidence of pallor or edema. Extraocular movements  in vertical and horizontal planes intact and without nystagmus. Visual fields by finger perimetry are intact. Hearing to finger rub intact.  Facial sensation intact to fine touch. Facial motor strength is  symmetric and tongue and uvula move midline. Motor exam:  Normal tone and symmetric normal strength in all extremities.  Sensory:  Fine touch, pinprick and vibration were tested in all extremities. In his fingertips, his sensation has been decreased to fine touch and vibration, but not to temperature or pain. Marland Kitchen Proprioception is tested in the upper extremities only. This was normal. Very dry finger tips, skin is "parched". Decreased toe vibration and ankle vibration.  Coordination: Rapid alternating movements in the fingers/hands is tested and normal.  Finger-to-nose maneuver tested and normal without evidence of ataxia, dysmetria or tremor. Gait and station: Patient walks without assistive device and is able and assisted stool climb up to the exam table. Strength within normal limits. Stance is stable and normal. Steps are unfragmented. Deep tendon reflexes: in the  upper and lower extremities are symmetric and intact. Babinski maneuver response is downgoing.   Assessment:   20 minute face to face time dedicated to coordination of care and discussion of treatment alternatives. After physical and neurologic examination, review of laboratory studies, imaging, neurophysiology testing and pre-existing records, assessment is   1) Burning dysesthesias as the day progresses , mostly in feet. Numb fingertips has affected his ability to turn pages-  neuropathy . Small fiber. He is not diabetic  No associated dysautonomia?.  He denies constipation, has orthostatic lightheadedness, but he no longer sweats , even in summertime.   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 He was checked in [facility] by Dr Carmelia Bake office, has bilateral carpal tunnel syndrome.  2) OSA - treated well on CPAP, 10 cm water , compliance is high at 97% , year after year, RV in 12 month . Epworth 6 .   3) He has overcome Myalgia , attributed to statin use. He tolerates Lipitor 10 mg.   4) gluten free and lactate free diet was not  successful in addressing his GI problems.    Lost body weight.   5) not allergic rhinitis, but nasal congestion all year round,  Nasal septal deviation, Dr Wilburn Cornelia to see patient regarding surgical correction.    Plan:  Treatment plan and additional workup: continue CPAP and Neurontin.  Gi work up was completed, without definitive diagnosis.  ENT referral to allow CPAP to work better. May use saline NS and NETI pot.

## 2015-10-08 ENCOUNTER — Ambulatory Visit (INDEPENDENT_AMBULATORY_CARE_PROVIDER_SITE_OTHER): Payer: Medicare Other | Admitting: Cardiovascular Disease

## 2015-10-08 ENCOUNTER — Encounter: Payer: Self-pay | Admitting: Cardiovascular Disease

## 2015-10-08 ENCOUNTER — Other Ambulatory Visit (INDEPENDENT_AMBULATORY_CARE_PROVIDER_SITE_OTHER): Payer: Medicare Other | Admitting: *Deleted

## 2015-10-08 VITALS — BP 138/80 | HR 68 | Ht 73.0 in | Wt 207.0 lb

## 2015-10-08 DIAGNOSIS — I2510 Atherosclerotic heart disease of native coronary artery without angina pectoris: Secondary | ICD-10-CM | POA: Diagnosis not present

## 2015-10-08 DIAGNOSIS — I2583 Coronary atherosclerosis due to lipid rich plaque: Secondary | ICD-10-CM | POA: Diagnosis not present

## 2015-10-08 DIAGNOSIS — E7800 Pure hypercholesterolemia, unspecified: Secondary | ICD-10-CM

## 2015-10-08 DIAGNOSIS — I119 Hypertensive heart disease without heart failure: Secondary | ICD-10-CM | POA: Diagnosis not present

## 2015-10-08 DIAGNOSIS — I10 Essential (primary) hypertension: Secondary | ICD-10-CM | POA: Diagnosis not present

## 2015-10-08 LAB — BASIC METABOLIC PANEL
BUN: 15 mg/dL (ref 7–25)
CALCIUM: 8.9 mg/dL (ref 8.6–10.3)
CHLORIDE: 103 mmol/L (ref 98–110)
CO2: 27 mmol/L (ref 20–31)
CREATININE: 1.19 mg/dL — AB (ref 0.70–1.18)
Glucose, Bld: 111 mg/dL — ABNORMAL HIGH (ref 65–99)
Potassium: 4.3 mmol/L (ref 3.5–5.3)
SODIUM: 139 mmol/L (ref 135–146)

## 2015-10-08 LAB — HEPATIC FUNCTION PANEL
ALBUMIN: 3.9 g/dL (ref 3.6–5.1)
ALK PHOS: 42 U/L (ref 40–115)
ALT: 20 U/L (ref 9–46)
AST: 21 U/L (ref 10–35)
Bilirubin, Direct: 0.1 mg/dL (ref <=0.2)
Indirect Bilirubin: 0.6 mg/dL (ref 0.2–1.2)
TOTAL PROTEIN: 6.8 g/dL (ref 6.1–8.1)
Total Bilirubin: 0.7 mg/dL (ref 0.2–1.2)

## 2015-10-08 LAB — LIPID PANEL
CHOL/HDL RATIO: 2.7 ratio (ref <=5.0)
Cholesterol: 147 mg/dL (ref 125–200)
HDL: 54 mg/dL (ref >=40)
LDL CALC: 64 mg/dL (ref <130)
TRIGLYCERIDES: 145 mg/dL (ref <150)
VLDL: 29 mg/dL (ref <30)

## 2015-10-08 NOTE — Progress Notes (Signed)
Cardiology Office Note   Date:  10/08/2015   ID:  Gabriel [name], DOB [birth year], MRN [id number]  PCP:  Sheral Flow, NP  Cardiologist:   Thayer Headings, MD , previous [facility] patient   Chief Complaint  Patient presents with  . Follow-up   Problem list 1. Coronary artery disease 2. Mild systolic congestive heart failure due to coronary artery disease 3. Essential hypertension 4. Hyperlipidemia 5. Obstructive sleep apnea 6. Abdominal bloating    History of Present Illness: Gabriel [name] is a 81 y.o. male who presents for his CAD  I preformed several PCIs in the 1995s - 2000  Had a recent PCI in July 2015.   ( mid LAD)   Not getting much regular exercise .    Cath in Jul;y 2015:  IMPRESSIONS:  1. Normal left main coronary artery. 2. 90% focal stenosis in the mid left anterior descending artery. This was successfully treated with a 2.25 x 12 promus drug-eluting stent, postdilated to 2.6 mm in diameter. Previously placed stent in the more proximal portion of the LAD is widely patent. 3. Patent stents in the mid left circumflex artery. Mild to moderate disease in the circumflex system. 4. Mild to moderate disease right coronary artery system. The distal RCA stent has mild in-stent restenosis. 5. Mildly decreased left ventricular systolic function. LVEDP 19 mmHg. Ejection fraction 40-45%.  Past Medical History  Diagnosis Date  . CAD (coronary artery disease)     a. s/p PTCA 1995. b. MI in 2003 s/p stenting to LAD 2003, s/p stent to Cx 2003. c. prior rotablator to PTCA vessel. d. Abnl nuc 01/2014: s/p DES to LAD, mild-mod nonobstructive disease otherwise.  Marland Kitchen Aortic valve disease     a. Mild AS, AI by echo 01/2014.  Marland Kitchen Hypertension   . Neuropathy ([place])   . Hyperlipidemia     a. Can only tolerate low dose statins.  . Kidney stone   . Insomnia   . Vertigo     Had Epley maneuver, vestibular rehab.  . OSA (obstructive sleep apnea)     with upper airway  resistancy  . GERD (gastroesophageal reflux disease)   . 1St degree AV block   . RBBB   . Ischemic cardiomyopathy     a. EF 40-45% by echo 02/04/14, 40-45% by cath 02/05/14 (similar to 2010).  . Carpal tunnel syndrome     Past Surgical History  Procedure Laterality Date  . Coronary stent placement  2003    LCX  . Cholecystectomy  2009  . Meniscus removal- knee Right   . Shoulder open rotator cuff repair  1990  . Hernia repair  1994    double  . Cataract extraction, bilateral  2004  . Kidney stone surgery  10/2005  . Lithotripsy  10/2005  . Cardiac catheterization  2003    Following MI  . Coronary angioplasty  2003    LAD  . Left heart cath  02/05/2014  . Left heart catheterization with coronary angiogram N/A 02/05/2014    Procedure: LEFT HEART CATHETERIZATION WITH CORONARY ANGIOGRAM;  Surgeon: Jettie Booze, MD;  Location: [facility] CATH LAB;  Service: Cardiovascular;  Laterality: N/A;  . Percutaneous coronary stent intervention (pci-s) Right 02/05/2014    Procedure: PERCUTANEOUS CORONARY STENT INTERVENTION (PCI-S);  Surgeon: Jettie Booze, MD;  Location: [facility] CATH LAB;  Service: Cardiovascular;  Laterality: Right;     Current Outpatient Prescriptions  Medication Sig Dispense Refill  . amLODipine (NORVASC) 5 MG tablet  Take 1 tablet (5 mg total) by mouth daily. 90 tablet 1  . aspirin EC 81 MG tablet Take 81 mg by mouth daily.    Marland Kitchen atorvastatin (LIPITOR) 10 MG tablet TAKE 1 TABLET BY MOUTH EVERY DAY 90 tablet 2  . clopidogrel (PLAVIX) 75 MG tablet Take 1 tablet (75 mg total) by mouth daily. 90 tablet 1  . Coenzyme Q10 (CO Q 10 PO) Take 200 mg by mouth daily.     . fluticasone (FLONASE) 50 MCG/ACT nasal spray Place 2 sprays into both nostrils as needed (allergies/rhinitis). Reported on 07/23/2015  11  . gabapentin (NEURONTIN) 800 MG tablet TAKE 1 TABLET (800 MG TOTAL) BY MOUTH 3 (THREE) TIMES DAILY. 270 tablet 2  . lisinopril (PRINIVIL,ZESTRIL) 20 MG tablet Take 1 tablet (20 mg  total) by mouth daily. 30 tablet 9  . loratadine (CLARITIN) 10 MG tablet Take 10 mg by mouth daily as needed. For allergies.    . Magnesium 500 MG TABS Take 500 mg by mouth daily.    . metoprolol succinate (TOPROL-XL) 25 MG 24 hr tablet TAKE 1 TABLET BY MOUTH DAILY 90 tablet 3  . Multiple Vitamins-Minerals (MULTIVITAMINS THER. W/MINERALS) TABS Take 1 tablet by mouth daily.    . nitroGLYCERIN (NITROSTAT) 0.4 MG SL tablet Place 1 tablet (0.4 mg total) under the tongue every 5 (five) minutes as needed for chest pain. 30 tablet 1  . Omega-3 Fatty Acids (OMEGA 3 PO) Take 1 tablet by mouth daily.    . Probiotic Product (PROBIOTIC PO) Take 10 mg by mouth daily.    . sildenafil (REVATIO) 20 MG tablet Take 40 mg by mouth daily as needed (erectile dysfunction).    . Wheat Dextrin (BENEFIBER PO) Take 5 g by mouth daily.    Marland Kitchen ZETIA 10 MG tablet TAKE 1 TABLET (10 MG TOTAL) BY MOUTH DAILY. 90 tablet 2  . zolpidem (AMBIEN) 10 MG tablet TAKE 1 TABLET BY MOUTH AT BEDTIME AS NEEDED 30 tablet 5   No current facility-administered medications for this visit.    Allergies:   Lipitor and Mevacor    Social History:  The patient  reports that he has quit smoking. He has never used smokeless tobacco. He reports that he drinks alcohol. He reports that he does not use illicit drugs.   Family History:  The patient's family history includes Diabetes in his mother; Heart Problems in his mother; Other in his sister; Parkinson's disease (age of onset: 70) in his father. There is no history of Heart attack or Stroke.    ROS:  Please see the history of present illness.    Review of Systems: Constitutional:  denies fever, chills, diaphoresis, appetite change and fatigue.  HEENT: denies photophobia, eye pain, redness, hearing loss, ear pain, congestion, sore throat, rhinorrhea, sneezing, neck pain, neck stiffness and tinnitus.  Respiratory: denies SOB, DOE, cough, chest tightness, and wheezing.  Cardiovascular: denies  chest pain, palpitations and leg swelling.  Gastrointestinal: denies nausea, vomiting, abdominal pain, diarrhea, constipation, blood in stool.  Genitourinary: denies dysuria, urgency, frequency, hematuria, flank pain and difficulty urinating.  Musculoskeletal: denies  myalgias, back pain, joint swelling, arthralgias and gait problem.   Skin: denies pallor, rash and wound.  Neurological: denies dizziness, seizures, syncope, weakness, light-headedness, numbness and headaches.   Hematological: denies adenopathy, easy bruising, personal or family bleeding history.  Psychiatric/ Behavioral: denies suicidal ideation, mood changes, confusion, nervousness, sleep disturbance and agitation.       All other systems are reviewed and  negative.    PHYSICAL EXAM: VS:  BP 138/80 mmHg  Pulse 68  Ht _0  (1.854 m)  Wt 207 lb (93.895 kg)  BMI 27.32 kg/m2 , BMI Body mass index is 27.32 kg/(m^2). GEN: Well nourished, well developed, in no acute distress HEENT: normal Neck: no JVD, carotid bruits, or masses Cardiac: RRR;  Soft 1-2 / 6 systolic  murmur,  No rubs, or gallops,no edema  Respiratory:  clear to auscultation bilaterally, normal work of breathing GI: soft, nontender, nondistended, + BS MS: no deformity or atrophy Skin: warm and dry, no rash Neuro:  Strength and sensation are intact Psych: normal   EKG:  EKG is ordered today. The ekg ordered today demonstrates NSR at 68.  RBBB , no changes from previous tracing    Recent Labs: 06/03/2015: ALT 21; BUN 17; Creat 1.14; Potassium 4.0; Sodium 138    Lipid Panel    Component Value Date/Time   CHOL 142 06/03/2015 0808   TRIG 137 06/03/2015 0808   HDL 53 06/03/2015 0808   CHOLHDL 2.7 06/03/2015 0808   VLDL 27 06/03/2015 0808   LDLCALC 62 06/03/2015 0808   LDLDIRECT 145.5 09/16/2013 0811      Wt Readings from Last 3 Encounters:  10/08/15 207 lb (93.895 kg)  09/21/15 211 lb (95.709 kg)  07/23/15 210 lb (95.255 kg)      Other  studies Reviewed: Additional studies/ records that were reviewed today include: . Review of the above records demonstrates:    ASSESSMENT AND PLAN:  1. Coronary artery disease - has had previous stenting of all 3 coronary arteries. s/p PCI of the mid LAD in July 2015.   No angina   2. Mild systolic congestive heart failure due to coronary artery disease - no episodes of shortness breath. 3. Essential hypertension - blood pressures well-controlled. 4. Hyperlipidemia- we'll check fasting lipids, basic metabolic profile, liver enzymes today. 5. Obstructive sleep apnea 6. Abdominal bloating    Current medicines are reviewed at length with the patient today.  The patient does not have concerns regarding medicines.  The following changes have been made:  no change  Labs/ tests ordered today include:   Orders Placed This Encounter  Procedures  . Comp Met (CMET)  . Lipid Profile  . EKG 12-Lead     Disposition:   FU with me in 6 months      Jorah Hua, Wonda Cheng, MD  10/08/2015 8:55 AM    [facility] [place], [place], [place]  [address] Phone: [phone]; Fax: [phone]   [facility]  [address] [place], [place]  [address] [phone]   Fax [phone]

## 2015-10-08 NOTE — Addendum Note (Signed)
Addended by: Eulis Foster on: 10/08/2015 07:56 AM   Modules accepted: Orders

## 2015-10-08 NOTE — Addendum Note (Signed)
Addended by: Eulis Foster on: 10/08/2015 07:59 AM   Modules accepted: Orders

## 2015-10-08 NOTE — Patient Instructions (Signed)
Medication Instructions:  None  Labwork: Your physician recommends that you return for lab work at your next appointment with Dr. Acie Fredrickson. (Lipids, CMET)   Testing/Procedures: None  Follow-Up: Your physician wants you to follow-up in: 6 months with Dr. Acie Fredrickson. You will receive a reminder letter in the mail two months in advance. If you don't receive a letter, please call our office to schedule the follow-up appointment.   Any Other Special Instructions Will Be Listed Below (If Applicable).     If you need a refill on your cardiac medications before your next appointment, please call your pharmacy.

## 2015-11-12 ENCOUNTER — Other Ambulatory Visit: Payer: Self-pay | Admitting: *Deleted

## 2015-11-12 MED ORDER — EZETIMIBE 10 MG PO TABS: 10.0000 mg | ORAL_TABLET | Freq: Every day | ORAL | Status: DC

## 2015-11-27 ENCOUNTER — Telehealth: Payer: Self-pay

## 2015-11-27 NOTE — Telephone Encounter (Signed)
Received a fax from [facility], stating PA for Zolpidem about to expire. When I submitted it to them, a message popped up. " PA not needed for this drug".

## 2015-11-27 NOTE — Telephone Encounter (Signed)
Per [facility], a PA is not required for Zolpidem 10mg . Local pharmacy notified.

## 2015-12-10 ENCOUNTER — Other Ambulatory Visit: Payer: Self-pay

## 2015-12-10 DIAGNOSIS — G4700 Insomnia, unspecified: Secondary | ICD-10-CM

## 2015-12-10 DIAGNOSIS — M170 Bilateral primary osteoarthritis of knee: Secondary | ICD-10-CM | POA: Diagnosis not present

## 2015-12-10 DIAGNOSIS — M1712 Unilateral primary osteoarthritis, left knee: Secondary | ICD-10-CM | POA: Diagnosis not present

## 2015-12-14 ENCOUNTER — Other Ambulatory Visit: Payer: Self-pay | Admitting: *Deleted

## 2015-12-14 MED ORDER — LISINOPRIL 20 MG PO TABS: 20.0000 mg | ORAL_TABLET | Freq: Every day | ORAL | Status: DC

## 2015-12-16 ENCOUNTER — Other Ambulatory Visit: Payer: Self-pay | Admitting: Primary Care

## 2015-12-16 DIAGNOSIS — G4700 Insomnia, unspecified: Secondary | ICD-10-CM

## 2015-12-16 NOTE — Telephone Encounter (Signed)
Received refill faxed request for zolpidem (AMBIEN) 10 MG tablet. Prescription have not been filled by Gabriel [name]. Last seen on 07/23/2015. CPE on 01/21/2016.

## 2015-12-17 MED ORDER — ZOLPIDEM TARTRATE 10 MG PO TABS
10.0000 mg | ORAL_TABLET | Freq: Every evening | ORAL | Status: DC | PRN
Start: 2015-12-17 — End: 2016-03-16

## 2015-12-18 NOTE — Telephone Encounter (Signed)
Called in [place] to [facility].

## 2016-01-21 ENCOUNTER — Other Ambulatory Visit: Payer: Self-pay | Admitting: Primary Care

## 2016-01-21 ENCOUNTER — Other Ambulatory Visit: Payer: Self-pay | Admitting: Orthopedic Surgery

## 2016-01-21 ENCOUNTER — Encounter: Payer: Self-pay | Admitting: Primary Care

## 2016-01-21 ENCOUNTER — Ambulatory Visit (INDEPENDENT_AMBULATORY_CARE_PROVIDER_SITE_OTHER): Payer: Medicare Other | Admitting: Primary Care

## 2016-01-21 VITALS — BP 126/80 | HR 75 | Temp 97.5°F | Ht 72.0 in | Wt 209.8 lb

## 2016-01-21 DIAGNOSIS — N529 Male erectile dysfunction, unspecified: Secondary | ICD-10-CM

## 2016-01-21 DIAGNOSIS — E7800 Pure hypercholesterolemia, unspecified: Secondary | ICD-10-CM

## 2016-01-21 DIAGNOSIS — J322 Chronic ethmoidal sinusitis: Secondary | ICD-10-CM

## 2016-01-21 DIAGNOSIS — I10 Essential (primary) hypertension: Secondary | ICD-10-CM

## 2016-01-21 DIAGNOSIS — R739 Hyperglycemia, unspecified: Secondary | ICD-10-CM | POA: Diagnosis not present

## 2016-01-21 DIAGNOSIS — Z0000 Encounter for general adult medical examination without abnormal findings: Secondary | ICD-10-CM | POA: Diagnosis not present

## 2016-01-21 DIAGNOSIS — Z9989 Dependence on other enabling machines and devices: Secondary | ICD-10-CM

## 2016-01-21 DIAGNOSIS — M238X2 Other internal derangements of left knee: Secondary | ICD-10-CM

## 2016-01-21 DIAGNOSIS — K219 Gastro-esophageal reflux disease without esophagitis: Secondary | ICD-10-CM

## 2016-01-21 DIAGNOSIS — G629 Polyneuropathy, unspecified: Secondary | ICD-10-CM

## 2016-01-21 DIAGNOSIS — G4733 Obstructive sleep apnea (adult) (pediatric): Secondary | ICD-10-CM

## 2016-01-21 DIAGNOSIS — Z125 Encounter for screening for malignant neoplasm of prostate: Secondary | ICD-10-CM | POA: Diagnosis not present

## 2016-01-21 DIAGNOSIS — Z23 Encounter for immunization: Secondary | ICD-10-CM

## 2016-01-21 LAB — HEMOGLOBIN A1C: HEMOGLOBIN A1C: 6 % (ref 4.6–6.5)

## 2016-01-21 LAB — BASIC METABOLIC PANEL
BUN: 14 mg/dL (ref 6–23)
CALCIUM: 8.9 mg/dL (ref 8.4–10.5)
CO2: 32 mEq/L (ref 19–32)
CREATININE: 1.19 mg/dL (ref 0.40–1.50)
Chloride: 103 mEq/L (ref 96–112)
GFR: 62.61 mL/min (ref >60.00)
GLUCOSE: 210 mg/dL — AB (ref 70–99)
Potassium: 4.3 mEq/L (ref 3.5–5.1)
Sodium: 136 mEq/L (ref 135–145)

## 2016-01-21 LAB — PSA, MEDICARE: PSA: 0.65 ng/ml (ref 0.10–4.00)

## 2016-01-21 MED ORDER — SILDENAFIL CITRATE 20 MG PO TABS: 40.0000 mg | ORAL_TABLET | Freq: Every day | ORAL | Status: DC | PRN

## 2016-01-21 NOTE — Assessment & Plan Note (Signed)
Stable on gabapentin.   

## 2016-01-21 NOTE — Addendum Note (Signed)
Addended by: Marchia Bond on: 01/21/2016 04:20 PM   Modules accepted: Miquel Dunn

## 2016-01-21 NOTE — Patient Instructions (Signed)
Complete lab work prior to leaving today. I will notify you of your results once received.   Please notify me when you are ready for the GI consult with Dr. Hilarie Fredrickson as discussed.  Try to exercise daily for at least 30 minutes.  You have been provided with a pneumonia vaccination which is the last one in this series.  Follow up in 1 year for repeat physical or sooner if needed.  It was a pleasure to see you today!

## 2016-01-21 NOTE — Progress Notes (Signed)
Patient ID: Gabriel [name], male   DOB: Nov 28, 1934, 81 y.o.   MRN: [id number]  HPI: Gabriel [name] is a 81 year old male who presents today for his annual wellness visit.  Past Medical History  Diagnosis Date  . CAD (coronary artery disease)     a. s/p PTCA 1995. b. MI in 2003 s/p stenting to LAD 2003, s/p stent to Cx 2003. c. prior rotablator to PTCA vessel. d. Abnl nuc 01/2014: s/p DES to LAD, mild-mod nonobstructive disease otherwise.  Marland Kitchen Aortic valve disease     a. Mild AS, AI by echo 01/2014.  Marland Kitchen Hypertension   . Neuropathy ([place])   . Hyperlipidemia     a. Can only tolerate low dose statins.  . Kidney stone   . Insomnia   . Vertigo     Had Epley maneuver, vestibular rehab.  . OSA (obstructive sleep apnea)     with upper airway resistancy  . GERD (gastroesophageal reflux disease)   . 1St degree AV block   . RBBB   . Ischemic cardiomyopathy     a. EF 40-45% by echo 02/04/14, 40-45% by cath 02/05/14 (similar to 2010).  . Carpal tunnel syndrome     Current Outpatient Prescriptions  Medication Sig Dispense Refill  . amLODipine (NORVASC) 5 MG tablet Take 1 tablet (5 mg total) by mouth daily. 90 tablet 1  . aspirin EC 81 MG tablet Take 81 mg by mouth daily.    Marland Kitchen atorvastatin (LIPITOR) 10 MG tablet TAKE 1 TABLET BY MOUTH EVERY DAY 90 tablet 2  . clopidogrel (PLAVIX) 75 MG tablet Take 1 tablet (75 mg total) by mouth daily. 90 tablet 1  . Coenzyme Q10 (CO Q 10 PO) Take 200 mg by mouth daily.     Marland Kitchen ezetimibe (ZETIA) 10 MG tablet Take 1 tablet (10 mg total) by mouth daily. 90 tablet 3  . gabapentin (NEURONTIN) 800 MG tablet TAKE 1 TABLET (800 MG TOTAL) BY MOUTH 3 (THREE) TIMES DAILY. 270 tablet 2  . lisinopril (PRINIVIL,ZESTRIL) 20 MG tablet Take 1 tablet (20 mg total) by mouth daily. 30 tablet 9  . Magnesium 500 MG TABS Take 500 mg by mouth daily.    . metoprolol succinate (TOPROL-XL) 25 MG 24 hr tablet TAKE 1 TABLET BY MOUTH DAILY 90 tablet 3  . Multiple Vitamins-Minerals (MULTIVITAMINS THER.  W/MINERALS) TABS Take 1 tablet by mouth daily.    . nitroGLYCERIN (NITROSTAT) 0.4 MG SL tablet Place 1 tablet (0.4 mg total) under the tongue every 5 (five) minutes as needed for chest pain. 30 tablet 1  . Omega-3 Fatty Acids (OMEGA 3 PO) Take 1 tablet by mouth daily.    . Probiotic Product (PROBIOTIC PO) Take 10 mg by mouth daily.    . sildenafil (REVATIO) 20 MG tablet Take 40 mg by mouth daily as needed (erectile dysfunction).    . Wheat Dextrin (BENEFIBER PO) Take 5 g by mouth daily.    Marland Kitchen zolpidem (AMBIEN) 10 MG tablet Take 1 tablet (10 mg total) by mouth at bedtime as needed. 90 tablet 0  . loratadine (CLARITIN) 10 MG tablet Take 10 mg by mouth daily as needed. Reported on 01/21/2016     No current facility-administered medications for this visit.    Allergies  Allergen Reactions  . Lipitor [Atorvastatin Calcium] Other (See Comments)    Muscle pain--Pt stated he can tolerate a low dose  . Mevacor [Lovastatin] Other (See Comments)    "makes me feel loopy"  Family History  Problem Relation Age of Onset  . Heart Problems Mother   . Parkinson's disease Father 15  . Other Sister     Muscular degeneration  . Heart attack Neg Hx   . Stroke Neg Hx   . Diabetes Mother     Social History   Social History  . Marital Status: Married    Spouse Name: Gabriel [name]  . Number of Children: 6  . Years of Education: hs   Occupational History  .     Social History Main Topics  . Smoking status: Former Research scientist (life sciences)  . Smokeless tobacco: Never Used     Comment: 1968  . Alcohol Use: 0.0 oz/week    0 Standard drinks or equivalent per week     Comment: rarely  . Drug Use: No  . Sexual Activity: Yes   Other Topics Concern  . Not on file   Social History Narrative   Patient is married Gabriel [name]).   Patient has four biological children and two adopted children.   Patient is retired from Electronics engineer.   Patient has a high school education.   Patient drinks coffee (12oz)  daily.   Patient is right-handed.   Enjoys traveling, working on projects.                 Hospitiliaztions: None  Health Maintenance:    Flu: Did not complete last season  Tetanus: Completed in 2012  Pneumovax: Completed in 2015  Prevnar: Never completed.  Zostavax: Never completed, declines  Colonoscopy: Completed in 2012  Eye Doctor: Completed in August 2016  Dental Exam: Completes semi-annually  PSA: Completed 5 years ago.     Providers: Dr. Cathie Olden, cardiology; Dr. Gladstone Lighter and Amedeo Plenty, orthopedics; Dr. Brett Fairy, neurology; Dr. Ronnald Ramp, Dermatology; Dr. Wilburn Cornelia, ENT; Dr. Ellie Lunch, opthomology; Dr. Harrington Challenger, Dentist; Dr. Milagros Reap, Chiropractor.    I have personally reviewed and have noted: 1. The patient's medical and social history 2. Their use of alcohol, tobacco or illicit drugs 3. Their current medications and supplements 4. The patient's functional ability including ADL's, fall risks, home safety risks and  hearing or visual impairment. 5. Diet and physical activities 6. Evidence for depression or mood disorder  Subjective:   Review of Systems:   Constitutional: Denies fever, malaise, fatigue, headache or abrupt weight changes.  HEENT: Denies eye pain, eye redness, ear pain, ringing in the ears, wax buildup.Does experience year round sinus pressure. Follows with ENT and recommended septal surgery.  Respiratory: Denies difficulty breathing, shortness of breath, cough or sputum production.   Cardiovascular: Denies chest pain, chest tightness, palpitations or swelling in the hands or feet.  Gastrointestinal: Denies abdominal pain, constipation, diarrhea or blood in the stool. Once followed with GI, unremarkable work up. Chronic bloating with abdominal firmness present for the past 2 years. Occasional excessive gas. Would like second opinion with GI, would like to see Dr. Hilarie Fredrickson. GU: Denies urgency, frequency, pain with urination, burning sensation, blood in urine, odor or  discharge. Musculoskeletal: Chronic left knee pain, following with orthopedics. Considering left knee replacement.   Skin: Denies redness, rashes, lesions or ulcercations. Dry skin.  Neurological: Denies dizziness, difficulty with memory, difficulty with speech or problems with balance and coordination. Does experience chronic neuropathy to bilateral feet. Improvement with gabapentin.  No other specific complaints in a complete review of systems (except as listed in HPI above).  Objective:  PE:   There were no vitals taken for this visit. Wt Readings from Last 3 Encounters:  10/08/15  207 lb (93.895 kg)  09/21/15 211 lb (95.709 kg)  07/23/15 210 lb (95.255 kg)    General: Appears their stated age, well developed, well nourished in NAD. Skin: Warm, dry and intact. No rashes, lesions or ulcerations noted. HEENT: Head: normal shape and size; Eyes: sclera white, no icterus, conjunctiva pink, PERRLA and EOMs intact; Ears: Tm's gray and intact, normal light reflex; Nose: mucosa pink and moist, septum midline; Throat/Mouth: Teeth present, mucosa pink and moist, no exudate, lesions or ulcerations noted.  Neck: Normal range of motion. Neck supple, trachea midline. No massses, lumps or thyromegaly present.  Cardiovascular: Normal rate and rhythm. S1,S2 noted.  No murmur, rubs or gallops noted. No JVD or BLE edema. No carotid bruits noted. Pulmonary/Chest: Normal effort and positive vesicular breath sounds. No respiratory distress. No wheezes, rales or ronchi noted.  Abdomen: Soft and mild tenderness to generalized abdomen. Normal bowel sounds, no bruits noted. Mild distension noted, chronic for patient. No masses noted. Liver, spleen and kidneys non palpable. Musculoskeletal: Normal range of motion. No signs of joint swelling. No difficulty with gait.  Neurological: Alert and oriented. Cranial nerves II-XII intact. Coordination normal. +DTRs bilaterally. Psychiatric: Mood and affect normal. Behavior  is normal. Judgment and thought content normal.    BMET    Component Value Date/Time   NA 139 10/08/2015 0800   K 4.3 10/08/2015 0800   CL 103 10/08/2015 0800   CO2 27 10/08/2015 0800   GLUCOSE 111* 10/08/2015 0800   BUN 15 10/08/2015 0800   CREATININE 1.19* 10/08/2015 0800   CREATININE 1.39 02/05/2015 0802   CALCIUM 8.9 10/08/2015 0800   GFRNONAA 68* 02/06/2014 0325   GFRAA 79* 02/06/2014 0325    Lipid Panel     Component Value Date/Time   CHOL 147 10/08/2015 0800   TRIG 145 10/08/2015 0800   HDL 54 10/08/2015 0800   CHOLHDL 2.7 10/08/2015 0800   VLDL 29 10/08/2015 0800   LDLCALC 64 10/08/2015 0800    CBC    Component Value Date/Time   WBC 6.6 02/06/2014 0325   RBC 3.62* 02/06/2014 0325   HGB 12.8* 02/06/2014 0325   HCT 36.7* 02/06/2014 0325   PLT 174 02/06/2014 0325   MCV 101.4* 02/06/2014 0325   MCH 35.4* 02/06/2014 0325   MCHC 34.9 02/06/2014 0325   RDW 12.9 02/06/2014 0325   LYMPHSABS 1.2 02/03/2014 1213   MONOABS 0.7 02/03/2014 1213   EOSABS 0.1 02/03/2014 1213   BASOSABS 0.0 02/03/2014 1213    Hgb A1C No results found for: HGBA1C    Assessment and Plan:   Medicare Annual Wellness Visit:  Diet: Endorses a fair diet: Breakfast: Cereal, coffee, pastry, eggs and bacon, oatmeal Lunch: Sandwich, lettuce wrap, left over Dinner: Meat, vegetable, starch, casseroles  Snacks: None Desserts: Everynight  Beverages: Coffee, water, sweet tea Physical activity: Active, does not routinely exercise Depression/mood screen: Negative Hearing: Intact to whispered voice Visual acuity: Grossly normal, performs annual eye exam  ADLs: Capable Fall risk: None Home safety: Good Cognitive evaluation: Intact to orientation, naming, recall and repetition EOL planning: Adv directives, full code  Preventative Medicine: Prevnar provided today, declines Zostavax. Tetanus UTD. Colonoscopy UTD. Does wish to see GI about chronic bloating and abdominal discomfort (negative  work up in the past). Will place referral when he's ready. Exam mostly unremrakable, mild bloating and tenderness to generalized abdomen. Labs pending. Discussed the importance of a healthy diet and regular exercise in order for weight loss and to reduce risk of other  medical diseases. Noted provided to patient today with recommendations.   Next appointment: Follow up in 1 year.

## 2016-01-21 NOTE — Assessment & Plan Note (Signed)
Stable per last lipid panel in March 2017. Continue Zetia and atorvastatin. LFT's WNL.

## 2016-01-21 NOTE — Assessment & Plan Note (Signed)
Stable. No PPI use.

## 2016-01-21 NOTE — Addendum Note (Signed)
Addended by: Jacqualin Combes on: 01/21/2016 08:33 AM   Modules accepted: Orders, SmartSet

## 2016-01-21 NOTE — Assessment & Plan Note (Signed)
Continue CPAP.  

## 2016-01-21 NOTE — Assessment & Plan Note (Signed)
Considering septal surgery. Agreed this may be beneficial to chronic sinus pressure, especially as he sleeps with CPAP.

## 2016-01-21 NOTE — Assessment & Plan Note (Signed)
Stable today. Continue current regimen. 

## 2016-01-21 NOTE — Assessment & Plan Note (Signed)
Prevnar provided today, declines Zostavax. Tetanus UTD. Colonoscopy UTD. Does wish to see GI about chronic bloating and abdominal discomfort (negative work up in the past). Will place referral when he's ready. Exam mostly unremrakable, mild bloating and tenderness to generalized abdomen. Labs pending. Discussed the importance of a healthy diet and regular exercise in order for weight loss and to reduce risk of other medical diseases. Noted provided to patient today with recommendations.  I have personally reviewed and have noted: 1. The patient's medical and social history 2. Their use of alcohol, tobacco or illicit drugs 3. Their current medications and supplements 4. The patient's functional ability including ADL's, fall risks, home safety risks and  hearing or visual impairment. 5. Diet and physical activities 6. Evidence for depression or mood disorder

## 2016-01-21 NOTE — Progress Notes (Signed)
Pre visit review using our clinic review tool, if applicable. No additional management support is needed unless otherwise documented below in the visit note. 

## 2016-01-25 ENCOUNTER — Other Ambulatory Visit: Payer: Self-pay | Admitting: *Deleted

## 2016-01-25 MED ORDER — AMLODIPINE BESYLATE 5 MG PO TABS: 5.0000 mg | ORAL_TABLET | Freq: Every day | ORAL | Status: DC

## 2016-01-25 MED ORDER — ATORVASTATIN CALCIUM 10 MG PO TABS: 10.0000 mg | ORAL_TABLET | Freq: Every day | ORAL | Status: DC

## 2016-01-29 ENCOUNTER — Ambulatory Visit
Admission: RE | Admit: 2016-01-29 | Discharge: 2016-01-29 | Disposition: A | Discharge status: Home / Self Care | Payer: Medicare Other | Source: Ambulatory Visit | Attending: Orthopedic Surgery | Admitting: Orthopedic Surgery

## 2016-01-29 DIAGNOSIS — M238X2 Other internal derangements of left knee: Secondary | ICD-10-CM

## 2016-01-29 DIAGNOSIS — S83282A Other tear of lateral meniscus, current injury, left knee, initial encounter: Secondary | ICD-10-CM | POA: Diagnosis not present

## 2016-02-08 ENCOUNTER — Other Ambulatory Visit: Payer: Self-pay | Admitting: Neurology

## 2016-02-09 ENCOUNTER — Other Ambulatory Visit: Payer: Self-pay

## 2016-02-09 MED ORDER — CLOPIDOGREL BISULFATE 75 MG PO TABS: 75.0000 mg | ORAL_TABLET | Freq: Every day | ORAL | Status: DC

## 2016-02-11 DIAGNOSIS — M170 Bilateral primary osteoarthritis of knee: Secondary | ICD-10-CM | POA: Diagnosis not present

## 2016-03-03 ENCOUNTER — Telehealth: Payer: Self-pay | Admitting: Cardiovascular Disease

## 2016-03-03 NOTE — Telephone Encounter (Signed)
Fax placed in nurse fax box 

## 2016-03-03 NOTE — Telephone Encounter (Signed)
Pt is at low risk for his upcoming surgery. He may hold the plavix for 5 days prior to surgery  I would prefer that he continue on the Aspirin

## 2016-03-03 NOTE — Telephone Encounter (Signed)
New message      Request for surgical clearance:  1. What type of surgery is being performed?  Left knee arthroscopy  When is this surgery scheduled?  Pending clearance 2. Are there any medications that need to be held prior to surgery and how long? Needs cardiac clearance  Name of physician performing surgery? Dr Gladstone Lighter What is your office phone and fax number?  Fax [phone]

## 2016-03-07 DIAGNOSIS — H52203 Unspecified astigmatism, bilateral: Secondary | ICD-10-CM | POA: Diagnosis not present

## 2016-03-07 DIAGNOSIS — Z961 Presence of intraocular lens: Secondary | ICD-10-CM | POA: Diagnosis not present

## 2016-03-07 DIAGNOSIS — H02834 Dermatochalasis of left upper eyelid: Secondary | ICD-10-CM | POA: Diagnosis not present

## 2016-03-07 DIAGNOSIS — H02831 Dermatochalasis of right upper eyelid: Secondary | ICD-10-CM | POA: Diagnosis not present

## 2016-03-08 DIAGNOSIS — Z85828 Personal history of other malignant neoplasm of skin: Secondary | ICD-10-CM | POA: Diagnosis not present

## 2016-03-08 DIAGNOSIS — L821 Other seborrheic keratosis: Secondary | ICD-10-CM | POA: Diagnosis not present

## 2016-03-08 DIAGNOSIS — L570 Actinic keratosis: Secondary | ICD-10-CM | POA: Diagnosis not present

## 2016-03-16 ENCOUNTER — Other Ambulatory Visit: Payer: Self-pay | Admitting: Primary Care

## 2016-03-16 DIAGNOSIS — G8918 Other acute postprocedural pain: Secondary | ICD-10-CM | POA: Diagnosis not present

## 2016-03-16 DIAGNOSIS — M1712 Unilateral primary osteoarthritis, left knee: Secondary | ICD-10-CM | POA: Diagnosis not present

## 2016-03-16 DIAGNOSIS — G4700 Insomnia, unspecified: Secondary | ICD-10-CM

## 2016-03-16 DIAGNOSIS — M23322 Other meniscus derangements, posterior horn of medial meniscus, left knee: Secondary | ICD-10-CM | POA: Diagnosis not present

## 2016-03-16 DIAGNOSIS — M659 Synovitis and tenosynovitis, unspecified: Secondary | ICD-10-CM | POA: Diagnosis not present

## 2016-03-16 DIAGNOSIS — M94262 Chondromalacia, left knee: Secondary | ICD-10-CM | POA: Diagnosis not present

## 2016-03-16 DIAGNOSIS — M23252 Derangement of posterior horn of lateral meniscus due to old tear or injury, left knee: Secondary | ICD-10-CM | POA: Diagnosis not present

## 2016-03-16 MED ORDER — ZOLPIDEM TARTRATE 10 MG PO TABS: 10.0000 mg | ORAL_TABLET | Freq: Every evening | ORAL | 0 refills | Status: DC | PRN

## 2016-03-16 NOTE — Addendum Note (Signed)
Addended by: Jacqualin Combes on: 03/16/2016 10:10 AM   Modules accepted: Orders

## 2016-03-16 NOTE — Telephone Encounter (Signed)
Ok to refill? Electronically refill request for zolpidem 10 mg. Last prescribed on 12/17/2015. Last seen on 01/21/2016. No future appt.

## 2016-03-17 NOTE — Telephone Encounter (Signed)
Called in medication to the pharmacy as instructed. 

## 2016-03-22 ENCOUNTER — Encounter: Payer: Self-pay | Admitting: Cardiovascular Disease

## 2016-03-29 DIAGNOSIS — M170 Bilateral primary osteoarthritis of knee: Secondary | ICD-10-CM | POA: Diagnosis not present

## 2016-03-29 DIAGNOSIS — Z4789 Encounter for other orthopedic aftercare: Secondary | ICD-10-CM | POA: Diagnosis not present

## 2016-04-06 ENCOUNTER — Other Ambulatory Visit: Payer: Medicare Other | Admitting: *Deleted

## 2016-04-06 ENCOUNTER — Encounter: Payer: Self-pay | Admitting: Cardiovascular Disease

## 2016-04-06 ENCOUNTER — Ambulatory Visit (INDEPENDENT_AMBULATORY_CARE_PROVIDER_SITE_OTHER): Payer: Medicare Other | Admitting: Cardiovascular Disease

## 2016-04-06 VITALS — BP 110/80 | HR 78 | Ht 72.0 in | Wt 200.8 lb

## 2016-04-06 DIAGNOSIS — I2510 Atherosclerotic heart disease of native coronary artery without angina pectoris: Secondary | ICD-10-CM

## 2016-04-06 DIAGNOSIS — I2583 Coronary atherosclerosis due to lipid rich plaque: Principal | ICD-10-CM

## 2016-04-06 DIAGNOSIS — I10 Essential (primary) hypertension: Secondary | ICD-10-CM | POA: Diagnosis not present

## 2016-04-06 DIAGNOSIS — E7800 Pure hypercholesterolemia, unspecified: Secondary | ICD-10-CM

## 2016-04-06 LAB — LIPID PANEL
Cholesterol: 147 mg/dL (ref 125–200)
HDL: 63 mg/dL (ref >=40)
LDL CALC: 59 mg/dL (ref <130)
Total CHOL/HDL Ratio: 2.3 Ratio (ref <=5.0)
Triglycerides: 126 mg/dL (ref <150)
VLDL: 25 mg/dL (ref <30)

## 2016-04-06 LAB — COMPREHENSIVE METABOLIC PANEL
ALK PHOS: 41 U/L (ref 40–115)
ALT: 18 U/L (ref 9–46)
AST: 20 U/L (ref 10–35)
Albumin: 4 g/dL (ref 3.6–5.1)
BILIRUBIN TOTAL: 0.9 mg/dL (ref 0.2–1.2)
BUN: 17 mg/dL (ref 7–25)
CALCIUM: 9 mg/dL (ref 8.6–10.3)
CO2: 26 mmol/L (ref 20–31)
Chloride: 103 mmol/L (ref 98–110)
Creat: 1.12 mg/dL (ref 0.70–1.18)
GLUCOSE: 104 mg/dL — AB (ref 65–99)
Potassium: 4.3 mmol/L (ref 3.5–5.3)
SODIUM: 140 mmol/L (ref 135–146)
Total Protein: 6.9 g/dL (ref 6.1–8.1)

## 2016-04-06 NOTE — Patient Instructions (Signed)

## 2016-04-06 NOTE — Progress Notes (Signed)
Cardiology Office Note   Date:  04/06/2016   ID:  KC MANCILLA, DOB 10/22/1934, MRN [id number]  PCP:  Sheral Flow, NP  Cardiologist:   Mertie Moores, MD , previous [facility] patient   No chief complaint on file.  Problem list 1. Coronary artery disease 2. Mild systolic congestive heart failure due to coronary artery disease 3. Essential hypertension 4. Hyperlipidemia 5. Obstructive sleep apnea 6. Abdominal bloating    History of Present Illness: Gabriel [name] is a 81 y.o. male who presents for his CAD  I preformed several PCIs in the 1995s - 2000  Had a recent PCI in July 2015.   ( mid LAD)   Not getting much regular exercise .    Cath in July 2015:  IMPRESSIONS:  1. Normal left main coronary artery. 2. 90% focal stenosis in the mid left anterior descending artery. This was successfully treated with a 2.25 x 12 promus drug-eluting stent, postdilated to 2.6 mm in diameter. Previously placed stent in the more proximal portion of the LAD is widely patent. 3. Patent stents in the mid left circumflex artery. Mild to moderate disease in the circumflex system. 4. Mild to moderate disease right coronary artery system. The distal RCA stent has mild in-stent restenosis. 5. Mildly decreased left ventricular systolic function. LVEDP 19 mmHg. Ejection fraction 40-45%.  Sept. 6, 2017:  Doing well.   Had left knee arthroscopy recently.  No complications. No CP , no dyspnea BP is well controlled   Past Medical History:  Diagnosis Date  . 1St degree AV block   . Aortic valve disease    a. Mild AS, AI by echo 01/2014.  Marland Kitchen CAD (coronary artery disease)    a. s/p PTCA 1995. b. MI in 2003 s/p stenting to LAD 2003, s/p stent to Cx 2003. c. prior rotablator to PTCA vessel. d. Abnl nuc 01/2014: s/p DES to LAD, mild-mod nonobstructive disease otherwise.  . Carpal tunnel syndrome   . GERD (gastroesophageal reflux disease)   . Hyperlipidemia    a. Can only tolerate low  dose statins.  . Hypertension   . Insomnia   . Ischemic cardiomyopathy    a. EF 40-45% by echo 02/04/14, 40-45% by cath 02/05/14 (similar to 2010).  . Kidney stone   . Neuropathy ([place])   . OSA (obstructive sleep apnea)    with upper airway resistancy  . RBBB   . Vertigo    Had Epley maneuver, vestibular rehab.    Past Surgical History:  Procedure Laterality Date  . CARDIAC CATHETERIZATION  2003   Following MI  . CATARACT EXTRACTION, BILATERAL  2004  . CHOLECYSTECTOMY  2009  . CORONARY ANGIOPLASTY  2003   LAD  . CORONARY STENT PLACEMENT  2003   LCX  . HERNIA REPAIR  1994   double  . KIDNEY STONE SURGERY  10/2005  . LEFT HEART CATH  02/05/2014  . LEFT HEART CATHETERIZATION WITH CORONARY ANGIOGRAM N/A 02/05/2014   Procedure: LEFT HEART CATHETERIZATION WITH CORONARY ANGIOGRAM;  Surgeon: Jettie Booze, MD;  Location: [facility] CATH LAB;  Service: Cardiovascular;  Laterality: N/A;  . LITHOTRIPSY  10/2005  . meniscus removal- knee Right   . PERCUTANEOUS CORONARY STENT INTERVENTION (PCI-S) Right 02/05/2014   Procedure: PERCUTANEOUS CORONARY STENT INTERVENTION (PCI-S);  Surgeon: Jettie Booze, MD;  Location: [facility] CATH LAB;  Service: Cardiovascular;  Laterality: Right;  . SHOULDER OPEN ROTATOR CUFF REPAIR  1990     Current Outpatient Prescriptions  Medication Sig  Dispense Refill  . amLODipine (NORVASC) 5 MG tablet Take 1 tablet (5 mg total) by mouth daily. 90 tablet 1  . aspirin EC 81 MG tablet Take 81 mg by mouth daily.    Marland Kitchen atorvastatin (LIPITOR) 10 MG tablet Take 1 tablet (10 mg total) by mouth daily. 90 tablet 2  . clopidogrel (PLAVIX) 75 MG tablet Take 1 tablet (75 mg total) by mouth daily. 90 tablet 3  . Coenzyme Q10 (CO Q 10 PO) Take 200 mg by mouth daily.     Marland Kitchen ezetimibe (ZETIA) 10 MG tablet Take 1 tablet (10 mg total) by mouth daily. 90 tablet 3  . gabapentin (NEURONTIN) 800 MG tablet TAKE 1 TABLET (800 MG TOTAL) BY MOUTH 3 TIMES DAILY. 270 tablet 0  . lisinopril  (PRINIVIL,ZESTRIL) 20 MG tablet Take 1 tablet (20 mg total) by mouth daily. 30 tablet 9  . loratadine (CLARITIN) 10 MG tablet Take 10 mg by mouth daily as needed. Reported on 01/21/2016    . Magnesium 500 MG TABS Take 500 mg by mouth daily.    . metoprolol succinate (TOPROL-XL) 25 MG 24 hr tablet TAKE 1 TABLET BY MOUTH DAILY 90 tablet 3  . Multiple Vitamins-Minerals (MULTIVITAMINS THER. W/MINERALS) TABS Take 1 tablet by mouth daily.    . nitroGLYCERIN (NITROSTAT) 0.4 MG SL tablet Place 1 tablet (0.4 mg total) under the tongue every 5 (five) minutes as needed for chest pain. 30 tablet 1  . Omega-3 Fatty Acids (OMEGA 3 PO) Take 1 tablet by mouth daily.    . Probiotic Product (PROBIOTIC PO) Take 10 mg by mouth daily.    . sildenafil (REVATIO) 20 MG tablet Take 2 tablets (40 mg total) by mouth daily as needed (erectile dysfunction). 50 tablet 1  . Wheat Dextrin (BENEFIBER PO) Take 5 g by mouth daily.    Marland Kitchen zolpidem (AMBIEN) 10 MG tablet Take 1 tablet (10 mg total) by mouth at bedtime as needed. 90 tablet 0   No current facility-administered medications for this visit.     Allergies:   Lipitor [atorvastatin calcium] and Mevacor [lovastatin]    Social History:  The patient  reports that he has quit smoking. He has never used smokeless tobacco. He reports that he drinks alcohol. He reports that he does not use drugs.   Family History:  The patient's family history includes Diabetes in his mother; Heart Problems in his mother; Other in his sister; Parkinson's disease (age of onset: 70) in his father.    ROS:  Please see the history of present illness.    Review of Systems: Constitutional:  denies fever, chills, diaphoresis, appetite change and fatigue.  HEENT: denies photophobia, eye pain, redness, hearing loss, ear pain, congestion, sore throat, rhinorrhea, sneezing, neck pain, neck stiffness and tinnitus.  Respiratory: denies SOB, DOE, cough, chest tightness, and wheezing.  Cardiovascular:  denies chest pain, palpitations and leg swelling.  Gastrointestinal: denies nausea, vomiting, abdominal pain, diarrhea, constipation, blood in stool.  Genitourinary: denies dysuria, urgency, frequency, hematuria, flank pain and difficulty urinating.  Musculoskeletal: denies  myalgias, back pain, joint swelling, arthralgias and gait problem.   Skin: denies pallor, rash and wound.  Neurological: denies dizziness, seizures, syncope, weakness, light-headedness, numbness and headaches.   Hematological: denies adenopathy, easy bruising, personal or family bleeding history.  Psychiatric/ Behavioral: denies suicidal ideation, mood changes, confusion, nervousness, sleep disturbance and agitation.       All other systems are reviewed and negative.    PHYSICAL EXAM: VS:  BP  110/80 (BP Location: Left Arm, Patient Position: Sitting, Cuff Size: Normal)   Pulse 78   Ht 6' (1.829 m)   Wt 200 lb 12.8 oz (91.1 kg)   BMI 27.23 kg/m  , BMI Body mass index is 27.23 kg/m. GEN: Well nourished, well developed, in no acute distress  HEENT: normal  Neck: no JVD, carotid bruits, or masses Cardiac: RRR;  Soft 1-2 / 6 systolic  murmur,  No rubs, or gallops,no edema  Respiratory:  clear to auscultation bilaterally, normal work of breathing GI: soft, nontender, nondistended, + BS MS: no deformity or atrophy  Skin: warm and dry, no rash Neuro:  Strength and sensation are intact Psych: normal   EKG:  EKG is ordered today. The ekg ordered today demonstrates NSR at 68.  RBBB , no changes from previous tracing    Recent Labs: 10/08/2015: ALT 20 01/21/2016: BUN 14; Creatinine, Ser 1.19; Potassium 4.3; Sodium 136    Lipid Panel    Component Value Date/Time   CHOL 147 10/08/2015 0800   TRIG 145 10/08/2015 0800   HDL 54 10/08/2015 0800   CHOLHDL 2.7 10/08/2015 0800   VLDL 29 10/08/2015 0800   LDLCALC 64 10/08/2015 0800   LDLDIRECT 145.5 09/16/2013 0811      Wt Readings from Last 3 Encounters:    04/06/16 200 lb 12.8 oz (91.1 kg)  01/21/16 209 lb 12.8 oz (95.2 kg)  10/08/15 207 lb (93.9 kg)      Other studies Reviewed: Additional studies/ records that were reviewed today include: . Review of the above records demonstrates:    ASSESSMENT AND PLAN:  1. Coronary artery disease - has had previous stenting of all 3 coronary arteries. s/p PCI of the mid LAD in July 2015.   No angina  Continue current medications. Will check lipids today.  2. Mild systolic congestive heart failure due to coronary artery disease - no episodes of shortness breath. 3. Essential hypertension - blood pressures well-controlled.  4. Hyperlipidemia- we'll check fasting lipids, basic metabolic profile, liver enzymes today.  5. Obstructive sleep apnea  6. Abdominal bloating    Current medicines are reviewed at length with the patient today.  The patient does not have concerns regarding medicines.  The following changes have been made:  no change  Labs/ tests ordered today include:   No orders of the defined types were placed in this encounter.    Disposition:   FU with me in 6 months      Mertie Moores, MD  04/06/2016 8:47 AM    [facility] [address], [place], [place]  [address] Phone: [phone]; Fax: [phone]

## 2016-04-08 DIAGNOSIS — M2392 Unspecified internal derangement of left knee: Secondary | ICD-10-CM | POA: Diagnosis not present

## 2016-04-15 ENCOUNTER — Encounter: Payer: Self-pay | Admitting: Cardiovascular Disease

## 2016-04-18 DIAGNOSIS — M238X2 Other internal derangements of left knee: Secondary | ICD-10-CM | POA: Diagnosis not present

## 2016-04-19 DIAGNOSIS — Z4789 Encounter for other orthopedic aftercare: Secondary | ICD-10-CM | POA: Diagnosis not present

## 2016-05-09 ENCOUNTER — Other Ambulatory Visit: Payer: Self-pay | Admitting: Neurology

## 2016-05-16 ENCOUNTER — Encounter: Payer: Self-pay | Admitting: Cardiovascular Disease

## 2016-05-20 ENCOUNTER — Encounter: Payer: Self-pay | Admitting: Cardiovascular Disease

## 2016-05-25 ENCOUNTER — Ambulatory Visit (INDEPENDENT_AMBULATORY_CARE_PROVIDER_SITE_OTHER): Payer: Medicare Other | Admitting: Primary Care

## 2016-05-25 ENCOUNTER — Encounter: Payer: Self-pay | Admitting: Primary Care

## 2016-05-25 VITALS — BP 124/80 | HR 83 | Temp 97.7°F | Ht 72.0 in | Wt 205.4 lb

## 2016-05-25 DIAGNOSIS — I2583 Coronary atherosclerosis due to lipid rich plaque: Secondary | ICD-10-CM

## 2016-05-25 DIAGNOSIS — R7303 Prediabetes: Secondary | ICD-10-CM | POA: Diagnosis not present

## 2016-05-25 DIAGNOSIS — I2510 Atherosclerotic heart disease of native coronary artery without angina pectoris: Secondary | ICD-10-CM | POA: Diagnosis not present

## 2016-05-25 LAB — HEMOGLOBIN A1C: HEMOGLOBIN A1C: 6 % (ref 4.6–6.5)

## 2016-05-25 NOTE — Patient Instructions (Signed)
Complete lab work prior to leaving today. I will notify you of your results once received.   Continue to work on reduction in sweets, starchy foods, fast food, fried foods.  Start exercising. You should be getting 150 minutes of moderate intensity exercise weekly.  You are cleared to drive the [id number] passenger Gabriel [name].  It was a pleasure to see you today!  Prediabetes Eating Plan Prediabetes--also called impaired glucose tolerance or impaired fasting glucose--is a condition that causes blood sugar (blood glucose) levels to be higher than normal. Following a healthy diet can help to keep prediabetes under control. It can also help to lower the risk of type 2 diabetes and heart disease, which are increased in people who have prediabetes. Along with regular exercise, a healthy diet:  Promotes weight loss.  Helps to control blood sugar levels.  Helps to improve the way that the body uses insulin. WHAT DO I NEED TO KNOW ABOUT THIS EATING PLAN?  Use the glycemic index (GI) to plan your meals. The index tells you how quickly a food will raise your blood sugar. Choose low-GI foods. These foods take a longer time to raise blood sugar.  Pay close attention to the amount of carbohydrates in the food that you eat. Carbohydrates increase blood sugar levels.  Keep track of how many calories you take in. Eating the right amount of calories will help you to achieve a healthy weight. Losing about 7 percent of your starting weight can help to prevent type 2 diabetes.  You may want to follow a Mediterranean diet. This diet includes a lot of vegetables, lean meats or fish, whole grains, fruits, and healthy oils and fats. WHAT FOODS CAN I EAT? Grains Whole grains, such as whole-wheat or whole-grain breads, crackers, cereals, and pasta. Unsweetened oatmeal. Bulgur. Barley. Quinoa. Brown rice. Corn or whole-wheat flour tortillas or taco shells. Vegetables Lettuce. Spinach. Peas. Beets. Cauliflower. Cabbage.  Broccoli. Carrots. Tomatoes. Squash. Eggplant. Herbs. Peppers. Onions. Cucumbers. Brussels sprouts. Fruits Berries. Bananas. Apples. Oranges. Grapes. Papaya. Mango. Pomegranate. Kiwi. Grapefruit. Cherries. Meats and Other Protein Sources Seafood. Lean meats, such as chicken and [place] or lean cuts of pork and beef. Tofu. Eggs. Nuts. Beans. Dairy Low-fat or fat-free dairy products, such as yogurt, cottage cheese, and cheese. Beverages Water. Tea. Coffee. Sugar-free or diet soda. Seltzer water. Milk. Milk alternatives, such as soy or almond milk. Condiments Mustard. Relish. Low-fat, low-sugar ketchup. Low-fat, low-sugar barbecue sauce. Low-fat or fat-free mayonnaise. Sweets and Desserts Sugar-free or low-fat pudding. Sugar-free or low-fat ice cream and other frozen treats. Fats and Oils Avocado. Walnuts. Olive oil. The items listed above may not be a complete list of recommended foods or beverages. Contact your dietitian for more options.  WHAT FOODS ARE NOT RECOMMENDED? Grains Refined white flour and flour products, such as bread, pasta, snack foods, and cereals. Beverages Sweetened drinks, such as sweet iced tea and soda. Sweets and Desserts Baked goods, such as cake, cupcakes, pastries, cookies, and cheesecake. The items listed above may not be a complete list of foods and beverages to avoid. Contact your dietitian for more information.   This information is not intended to replace advice given to you by your health care provider. Make sure you discuss any questions you have with your health care provider.   Document Released: 12/02/2014 Document Reviewed: 12/02/2014 Elsevier Interactive Patient Education [employer].

## 2016-05-25 NOTE — Progress Notes (Signed)
Pre visit review using our clinic review tool, if applicable. No additional management support is needed unless otherwise documented below in the visit note. 

## 2016-05-25 NOTE — Progress Notes (Signed)
Subjective:    Patient ID: Gabriel [name], male    DOB: [birth year], 81 y.o.   MRN: [id number]  HPI  Gabriel [name] is a 81 year old male who presents today for form completion. He is required to obtain medical permission to drive a 26-83 passenger Lucianne Lei. He's been driving this 41-96 passenger Lucianne Lei for years without any problem. 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 He's never experienced an accident with the Lucianne Lei and feels very comfortable with maneuvering.     Review of Systems  Constitutional: Negative for fatigue and fever.  Respiratory: Negative for cough and shortness of breath.   Cardiovascular: Negative for chest pain.  Allergic/Immunologic: Positive for environmental allergies.  Neurological: Negative for dizziness, weakness and numbness.  Psychiatric/Behavioral: Negative for confusion.       Past Medical History:  Diagnosis Date  . 1st degree AV block   . Aortic valve disease    a. Mild AS, AI by echo 01/2014.  Marland Kitchen CAD (coronary artery disease)    a. s/p PTCA 1995. b. MI in 2003 s/p stenting to LAD 2003, s/p stent to Cx 2003. c. prior rotablator to PTCA vessel. d. Abnl nuc 01/2014: s/p DES to LAD, mild-mod nonobstructive disease otherwise.  . Carpal tunnel syndrome   . GERD (gastroesophageal reflux disease)   . Hyperlipidemia    a. Can only tolerate low dose statins.  . Hypertension   . Insomnia   . Ischemic cardiomyopathy    a. EF 40-45% by echo 02/04/14, 40-45% by cath 02/05/14 (similar to 2010).  . Kidney stone   . Neuropathy ([place])   . OSA (obstructive sleep apnea)    with upper airway resistancy  . RBBB   . Vertigo    Had Epley maneuver, vestibular rehab.     Social History   Social History  . Marital status: Married    Spouse name: [name]  . Number of children: 6  . Years of education: hs   Occupational History  .  Retired   Social History Main Topics  . Smoking status: Former Research scientist (life sciences)  . Smokeless tobacco: Never Used     Comment: 1968  .  Alcohol use 0.0 oz/week     Comment: rarely  . Drug use: No  . Sexual activity: Yes   Other Topics Concern  . Not on file   Social History Narrative   Patient is married [name]).   Patient has four biological children and two adopted children.   Patient is retired from Electronics engineer.   Patient has a high school education.   Patient drinks coffee (12oz) daily.   Patient is right-handed.   Enjoys traveling, working on projects.                 Past Surgical History:  Procedure Laterality Date  . CARDIAC CATHETERIZATION  2003   Following MI  . CATARACT EXTRACTION, BILATERAL  2004  . CHOLECYSTECTOMY  2009  . CORONARY ANGIOPLASTY  2003   LAD  . CORONARY STENT PLACEMENT  2003   LCX  . HERNIA REPAIR  1994   double  . KIDNEY STONE SURGERY  10/2005  . LEFT HEART CATH  02/05/2014  . LEFT HEART CATHETERIZATION WITH CORONARY ANGIOGRAM N/A 02/05/2014   Procedure: LEFT HEART CATHETERIZATION WITH CORONARY ANGIOGRAM;  Surgeon: Jettie Booze, MD;  Location: [facility] CATH LAB;  Service: Cardiovascular;  Laterality: N/A;  . LITHOTRIPSY  10/2005  . meniscus removal- knee Right   .  PERCUTANEOUS CORONARY STENT INTERVENTION (PCI-S) Right 02/05/2014   Procedure: PERCUTANEOUS CORONARY STENT INTERVENTION (PCI-S);  Surgeon: Jettie Booze, MD;  Location: [facility] CATH LAB;  Service: Cardiovascular;  Laterality: Right;  . SHOULDER OPEN ROTATOR CUFF REPAIR  1990    Family History  Problem Relation Age of Onset  . Heart Problems Mother   . Diabetes Mother   . Parkinson's disease Father 51  . Other Sister     Muscular degeneration  . Heart attack Neg Hx   . Stroke Neg Hx     Allergies  Allergen Reactions  . Lipitor [Atorvastatin Calcium] Other (See Comments)    Muscle pain--Pt stated he can tolerate a low dose  . Mevacor [Lovastatin] Other (See Comments)    "makes me feel loopy"    Current Outpatient Prescriptions on File Prior to Visit  Medication Sig Dispense Refill    . amLODipine (NORVASC) 5 MG tablet Take 1 tablet (5 mg total) by mouth daily. 90 tablet 1  . aspirin EC 81 MG tablet Take 81 mg by mouth daily.    Marland Kitchen atorvastatin (LIPITOR) 10 MG tablet Take 1 tablet (10 mg total) by mouth daily. 90 tablet 2  . clopidogrel (PLAVIX) 75 MG tablet Take 1 tablet (75 mg total) by mouth daily. 90 tablet 3  . Coenzyme Q10 (CO Q 10 PO) Take 200 mg by mouth daily.     Marland Kitchen ezetimibe (ZETIA) 10 MG tablet Take 1 tablet (10 mg total) by mouth daily. 90 tablet 3  . gabapentin (NEURONTIN) 800 MG tablet TAKE 1 TABLET BY MOUTH 3 TIMES DAILY. 270 tablet 0  . lisinopril (PRINIVIL,ZESTRIL) 20 MG tablet Take 1 tablet (20 mg total) by mouth daily. 30 tablet 9  . loratadine (CLARITIN) 10 MG tablet Take 10 mg by mouth daily as needed. Reported on 01/21/2016    . Magnesium 500 MG TABS Take 500 mg by mouth daily.    . metoprolol succinate (TOPROL-XL) 25 MG 24 hr tablet TAKE 1 TABLET BY MOUTH DAILY 90 tablet 3  . Multiple Vitamins-Minerals (MULTIVITAMINS THER. W/MINERALS) TABS Take 1 tablet by mouth daily.    . nitroGLYCERIN (NITROSTAT) 0.4 MG SL tablet Place 1 tablet (0.4 mg total) under the tongue every 5 (five) minutes as needed for chest pain. 30 tablet 1  . Omega-3 Fatty Acids (OMEGA 3 PO) Take 1 tablet by mouth daily.    . Probiotic Product (PROBIOTIC PO) Take 10 mg by mouth daily.    . sildenafil (REVATIO) 20 MG tablet Take 2 tablets (40 mg total) by mouth daily as needed (erectile dysfunction). 50 tablet 1  . Wheat Dextrin (BENEFIBER PO) Take 5 g by mouth daily.    Marland Kitchen zolpidem (AMBIEN) 10 MG tablet Take 1 tablet (10 mg total) by mouth at bedtime as needed. 90 tablet 0   No current facility-administered medications on file prior to visit.     BP 124/80   Pulse 83   Temp 97.7 F (36.5 C) (Oral)   Ht 6' (1.829 m)   Wt 205 lb 6.4 oz (93.2 kg)   SpO2 95%   BMI 27.86 kg/m    Objective:   Physical Exam  Constitutional: He is oriented to person, place, and time. He appears  well-nourished.  Eyes: EOM are normal. Pupils are equal, round, and reactive to light.  Cardiovascular: Normal rate and regular rhythm.   Pulmonary/Chest: Effort normal and breath sounds normal.  Neurological: He is alert and oriented to person, place, and time. No  cranial nerve deficit. Coordination normal.  Skin: Skin is warm and dry.  Psychiatric: He has a normal mood and affect.          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 Exam today unremarkable. No medical indications that would prohibit him from operating this vehicle. Recently met with cardiology and is stable. Form completed and signed. Approved to operate the [place].  Sheral Flow, NP

## 2016-05-31 DIAGNOSIS — M238X2 Other internal derangements of left knee: Secondary | ICD-10-CM | POA: Diagnosis not present

## 2016-05-31 DIAGNOSIS — Z4789 Encounter for other orthopedic aftercare: Secondary | ICD-10-CM | POA: Diagnosis not present

## 2016-06-14 ENCOUNTER — Other Ambulatory Visit: Payer: Self-pay | Admitting: Primary Care

## 2016-06-14 DIAGNOSIS — G4700 Insomnia, unspecified: Secondary | ICD-10-CM

## 2016-06-14 MED ORDER — ZOLPIDEM TARTRATE 10 MG PO TABS: 10.0000 mg | ORAL_TABLET | Freq: Every evening | ORAL | 0 refills | Status: DC | PRN

## 2016-06-14 NOTE — Telephone Encounter (Signed)
Received faxed refill request for  zolpidem (AMBIEN) 10 MG tablet  Last prescribed on 03/16/2016. Last seen on 05/25/2016.

## 2016-06-15 NOTE — Telephone Encounter (Signed)
Called in medication to the pharmacy as instructed. 

## 2016-06-18 ENCOUNTER — Other Ambulatory Visit: Payer: Self-pay | Admitting: Primary Care

## 2016-06-18 DIAGNOSIS — G4700 Insomnia, unspecified: Secondary | ICD-10-CM

## 2016-06-20 NOTE — Telephone Encounter (Signed)
Pt request status of zolpidem refill; spoke with Janett Billow at [facility] and they did not receive the refill on 06/15/16. Medication phoned to [place] at [facility] as instructed and rx will be ready for pick up in 30 mins. Pt voiced understanding.

## 2016-07-05 ENCOUNTER — Other Ambulatory Visit: Payer: Self-pay | Admitting: Cardiovascular Disease

## 2016-07-27 ENCOUNTER — Telehealth: Payer: Self-pay | Admitting: Cardiovascular Disease

## 2016-07-27 DIAGNOSIS — E782 Mixed hyperlipidemia: Secondary | ICD-10-CM

## 2016-07-27 NOTE — Telephone Encounter (Signed)
New message   pt call requesting to speak with RN about getting lab work orders put in the system for appt on 3/19. please call back to disucss

## 2016-07-27 NOTE — Telephone Encounter (Signed)
Spoke with patient's wife and scheduled both of them for fasting lab appointment prior to office visit with Dr. Acie Fredrickson on 3/19.  She thanked me for the call.

## 2016-07-28 ENCOUNTER — Other Ambulatory Visit: Payer: Self-pay | Admitting: *Deleted

## 2016-07-28 MED ORDER — METOPROLOL SUCCINATE ER 25 MG PO TB24: 25.0000 mg | ORAL_TABLET | Freq: Every day | ORAL | 2 refills | Status: DC

## 2016-08-04 ENCOUNTER — Other Ambulatory Visit: Payer: Self-pay | Admitting: *Deleted

## 2016-08-04 MED ORDER — NITROGLYCERIN 0.4 MG SL SUBL: 0.4000 mg | SUBLINGUAL_TABLET | SUBLINGUAL | 1 refills | Status: AC | PRN

## 2016-08-15 DIAGNOSIS — Z4789 Encounter for other orthopedic aftercare: Secondary | ICD-10-CM | POA: Diagnosis not present

## 2016-09-16 ENCOUNTER — Other Ambulatory Visit: Payer: Self-pay | Admitting: Primary Care

## 2016-09-16 DIAGNOSIS — G4700 Insomnia, unspecified: Secondary | ICD-10-CM

## 2016-09-16 MED ORDER — ZOLPIDEM TARTRATE 10 MG PO TABS: 10.0000 mg | ORAL_TABLET | Freq: Every evening | ORAL | 0 refills | Status: DC | PRN

## 2016-09-16 NOTE — Telephone Encounter (Signed)
Called in medication to the pharmacy as instructed. 

## 2016-09-16 NOTE — Telephone Encounter (Signed)
Received faxed refill request fonrzolpidem (AMBIEN) 10 MG tablet. Last prescribed on 06/20/2016. Last seen on 05/25/2016.

## 2016-09-20 ENCOUNTER — Ambulatory Visit (INDEPENDENT_AMBULATORY_CARE_PROVIDER_SITE_OTHER): Payer: Medicare Other | Admitting: Neurology

## 2016-09-20 ENCOUNTER — Encounter: Payer: Self-pay | Admitting: Neurology

## 2016-09-20 VITALS — BP 108/58 | HR 78 | Resp 18 | Ht 72.0 in | Wt 206.0 lb

## 2016-09-20 DIAGNOSIS — Z9989 Dependence on other enabling machines and devices: Secondary | ICD-10-CM | POA: Diagnosis not present

## 2016-09-20 DIAGNOSIS — G4733 Obstructive sleep apnea (adult) (pediatric): Secondary | ICD-10-CM | POA: Diagnosis not present

## 2016-09-20 NOTE — Progress Notes (Signed)
[facility]  Provider:  Larey Seat, M D  Referring Provider: Pleas Koch, NP Primary Care Physician:  Sheral Flow, NP    OSA on CPAP :   HPI:  Gabriel [name] is a 82 y.o. male  Is seen here as a  revisit  from Dr. Carlis Abbott , his cardiologist , for a CPAP follow up.  [name] has been followed in this clinic since 2012 after  he underwent a sleep study( ordered by Dr. Mare Ferrari), on 04-19-11.  At that time he has a past medical history of coronary artery disease, optic valve disease, peripheral neuropathy and hypertension.  He had mostly complained of restless legs and witnessed apneas. The patient was diagnosed with an AHI of 24.9 and an RDI of 28.5. There was a clear accentuation of apnea during REM sleep - the REM AHI being 48.9,  supine AHI was also elevated at 40 over the nonsupine AHI of of 14. Nadir of  oxygen saturation was 77% and 59 minutes of desaturations total time.  The patient returned for a titration study and was titrated to 6 cm water pressure and 2 centimeter EPR. He had frequent sinusitis before CPAP use, and not since. He is not longer snoring. He may have one nocturia if any at all. Overall sleep duration is 7 hours at night , occasional naps in the afternoon.   09-18-14 CPAP download shows the patient using the machine 7 hours and 28 minutes nightly, with a residual AHI of 3.0 , a moderate air leak.  The patient uses a nasal pillow with heated humidity. He has a 100% compliance for CPAP use above 4 hours nightly user time.  Out of 90 days the machine was used 90 days, exemplary user.  His restless legs have been asymptomatic - he had PLMs , no longer RLS.   The pets states that he feels he can breathe much better once he starts using CPAP otherwise he often has an congested nose he also has trouble with repeated sinusitis infections.He has spoken to Dr. Wilburn Cornelia who diagnosed him with a deviated septum and wanted me to discuss today  with the patient as this could improve his overall nasal airflow as well as the CPAP compliance or tolerance.  I think the patient is obviously doing very well with his CPAP as is but he has discomfort and frequent  Nasal discharges he also complains of a postnasal drip. He could benefit indeed from an septal correction surgery and perhaps an adenoid trimming.  09-21-15 Patient here for yearly Rv on CPAP, compliance documentation. Gabriel [name] is seen here today and be discussed the compliance report for CPAP obtained in the office which includes last night's data. He uses the machine on average 7 hours and 28 minutes nightly has a 97% compliance for over 4 hours of daily use there was truly only one day over the last 30 days was user time was shorter than 4 hours. His residual AHI is 4.4 he does have some significant air leaks and typically there is a higher residual AHI on week ends. That may be related to intake of alcohol, if they go out. He doesn't drink wine regularly and not at home.  No caffeine in the evening.   Interval history 09-20-2016 , Patient is 100% compliant and very happy with CPAP, he can't sleep without it.  His residual AHI was 3.0 at a setting of 6 cm water with 3 cm EPR, average user time  7 hours 49 minutes. He is using a nasal pillow, endorsed at 0 points on the geriatric depression score, 5 points on the Epworth Sleepiness Scale and fatigue severity scale was endorsed at 24 points all below average. 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      Review of Systems: 09-21-15  Out of a complete 14 system review, the patient complains of only the following symptoms, and all other reviewed systems are negative. The patient endorsed the geriatric depression score at 0 points, the restless leg score at mild degree of impairment. FSS at  from 28 and Epworth at 6 points.   Social History   Social History  . Marital status: Married    Spouse  name: [name]  . Number of children: 6  . Years of education: hs   Occupational History  .  Retired   Social History Main Topics  . Smoking status: Former Research scientist (life sciences)  . Smokeless tobacco: Never Used     Comment: 1968  . Alcohol use 0.0 oz/week     Comment: rarely  . Drug use: No  . Sexual activity: Yes   Other Topics Concern  . Not on file   Social History Narrative   Patient is married [name]).   Patient has four biological children and two adopted children.   Patient is retired from Electronics engineer.   Patient has a high school education.   Patient drinks coffee (12oz) daily.   Patient is right-handed.   Enjoys traveling, working on projects.                 Family History  Problem Relation Age of Onset  . Heart Problems Mother   . Diabetes Mother   . Parkinson's disease Father 75  . Other Sister     Muscular degeneration  . Heart attack Neg Hx   . Stroke Neg Hx     Past Medical History:  Diagnosis Date  . 1st degree AV block   . Aortic valve disease    a. Mild AS, AI by echo 01/2014.  Marland Kitchen CAD (coronary artery disease)    a. s/p PTCA 1995. b. MI in 2003 s/p stenting to LAD 2003, s/p stent to Cx 2003. c. prior rotablator to PTCA vessel. d. Abnl nuc 01/2014: s/p DES to LAD, mild-mod nonobstructive disease otherwise.  . Carpal tunnel syndrome   . GERD (gastroesophageal reflux disease)   . Hyperlipidemia    a. Can only tolerate low dose statins.  . Hypertension   . Insomnia   . Ischemic cardiomyopathy    a. EF 40-45% by echo 02/04/14, 40-45% by cath 02/05/14 (similar to 2010).  . Kidney stone   . Neuropathy ([place])   . OSA (obstructive sleep apnea)    with upper airway resistancy  . RBBB   . Vertigo    Had Epley maneuver, vestibular rehab.    Past Surgical History:  Procedure Laterality Date  . CARDIAC CATHETERIZATION  2003   Following MI  . CATARACT EXTRACTION, BILATERAL  2004  . CHOLECYSTECTOMY  2009  . CORONARY ANGIOPLASTY  2003   LAD  .  CORONARY STENT PLACEMENT  2003   LCX  . HERNIA REPAIR  1994   double  . KIDNEY STONE SURGERY  10/2005  . LEFT HEART CATH  02/05/2014  . LEFT HEART CATHETERIZATION WITH CORONARY ANGIOGRAM N/A 02/05/2014   Procedure: LEFT HEART CATHETERIZATION WITH CORONARY ANGIOGRAM;  Surgeon: Jettie Booze, MD;  Location[facility] CATH  LAB;  Service: Cardiovascular;  Laterality: N/A;  . LITHOTRIPSY  10/2005  . meniscus removal- knee Right   . PERCUTANEOUS CORONARY STENT INTERVENTION (PCI-S) Right 02/05/2014   Procedure: PERCUTANEOUS CORONARY STENT INTERVENTION (PCI-S);  Surgeon: Jettie Booze, MD;  Location: [facility] CATH LAB;  Service: Cardiovascular;  Laterality: Right;  . SHOULDER OPEN ROTATOR CUFF REPAIR  1990    Current Outpatient Prescriptions  Medication Sig Dispense Refill  . amLODipine (NORVASC) 5 MG tablet TAKE 1 TABLET BY MOUTH DAILY. 90 tablet 3  . aspirin EC 81 MG tablet Take 81 mg by mouth daily.    Marland Kitchen atorvastatin (LIPITOR) 10 MG tablet Take 1 tablet (10 mg total) by mouth daily. 90 tablet 2  . clopidogrel (PLAVIX) 75 MG tablet Take 1 tablet (75 mg total) by mouth daily. 90 tablet 3  . Coenzyme Q10 (CO Q 10 PO) Take 200 mg by mouth daily.     Marland Kitchen ezetimibe (ZETIA) 10 MG tablet Take 1 tablet (10 mg total) by mouth daily. 90 tablet 3  . gabapentin (NEURONTIN) 800 MG tablet TAKE 1 TABLET BY MOUTH 3 TIMES DAILY. 270 tablet 0  . lisinopril (PRINIVIL,ZESTRIL) 20 MG tablet Take 1 tablet (20 mg total) by mouth daily. 30 tablet 9  . loratadine (CLARITIN) 10 MG tablet Take 10 mg by mouth daily as needed. Reported on 01/21/2016    . Magnesium 500 MG TABS Take 500 mg by mouth daily.    . metoprolol succinate (TOPROL-XL) 25 MG 24 hr tablet Take 1 tablet (25 mg total) by mouth daily. 90 tablet 2  . Multiple Vitamins-Minerals (MULTIVITAMINS THER. W/MINERALS) TABS Take 1 tablet by mouth daily.    . nitroGLYCERIN (NITROSTAT) 0.4 MG SL tablet Place 1 tablet (0.4 mg total) under the tongue every 5 (five) minutes as  needed for chest pain. 30 tablet 1  . Omega-3 Fatty Acids (OMEGA 3 PO) Take 1 tablet by mouth daily.    . Probiotic Product (PROBIOTIC PO) Take 10 mg by mouth daily.    . sildenafil (REVATIO) 20 MG tablet Take 2 tablets (40 mg total) by mouth daily as needed (erectile dysfunction). 50 tablet 1  . Wheat Dextrin (BENEFIBER PO) Take 5 g by mouth daily.    Marland Kitchen zolpidem (AMBIEN) 10 MG tablet Take 1 tablet (10 mg total) by mouth at bedtime as needed. 90 tablet 0   No current facility-administered medications for this visit.     Allergies as of 09/20/2016 - Review Complete 09/20/2016  Allergen Reaction Noted  . Lipitor [atorvastatin calcium] Other (See Comments) 08/30/2010  . Mevacor [lovastatin] Other (See Comments) 08/30/2010    Vitals: BP (!) 108/58   Pulse 78   Resp 18   Ht 6' (1.829 m)   Wt 206 lb (93.4 kg)   BMI 27.94 kg/m  Last Weight:  Wt Readings from Last 1 Encounters:  09/20/16 206 lb (93.4 kg)   Last Height:   Ht Readings from Last 1 Encounters:  09/20/16 6' (1.829 m)     Physical exam:  General: The patient is awake, alert and appears not in acute distress. The patient is well groomed. Head: Normocephalic, atraumatic. Neck is supple. Mallampati 3 , neck circumference:15.25  inches  Cardiovascular:  Regular rate and rhythm, without  murmurs or carotid bruit, and without distended neck veins. Respiratory: Lungs are clear to auscultation. Skin:  Without evidence of edema, or rash Trunk: BMI is elevated , the patient has normal posture, no scoliosis.  Neurologic exam : The  patient is awake and alert, oriented to place and time. Memory subjective  described as intact. There is a normal attention span & concentration ability. Speech is fluent without  dysarthria, dysphonia or aphasia. Mood and affect are appropriate.  Cranial nerves: Pupils are equal and briskly reactive to light.  Extraocular movements  in vertical and horizontal planes intact and without nystagmus.  Visual fields by finger perimetry are intact. Hearing to finger rub intact.  Facial sensation intact to fine touch. Facial motor strength is symmetric and tongue and uvula move midline. Motor exam:  Normal tone and symmetric normal strength in all extremities.  Deep tendon reflexes: in the  upper and lower extremities are symmetric and intact. Babinski maneuver response is downgoing.   Assessment:   15 minute face to face time dedicated to coordination of care and discussion of treatment alternatives. After physical and neurologic examination, review of laboratory studies, imaging, neurophysiology testing and pre-existing records, assessment is   1) Burning dysesthesias as the day progresses , mostly in feet. Numb fingertips has affected his ability to turn pages-  Neuropathy. Small fiber. He is not diabetic  No associated dysautonomia? He denies constipation, has orthostatic lightheadedness, but he no longer sweats , even in summertime.   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 He was checked in [facility] by Dr. Carmelia Bake office, has bilateral carpal tunnel syndrome.  2) OSA - treated well on CPAP, 6 cm water with 3 cm EPR  , compliance is high at 100% , year after year, RV in 12 month . Epworth 5 .   3) He has overcome Myalgia , attributed to statin use. He tolerates Lipitor 10 mg, co enzyme Q 10 .   Plan:  RV in 12 month

## 2016-10-11 ENCOUNTER — Encounter: Payer: Self-pay | Admitting: Cardiovascular Disease

## 2016-10-13 ENCOUNTER — Other Ambulatory Visit: Payer: Medicare Other | Admitting: *Deleted

## 2016-10-13 DIAGNOSIS — E782 Mixed hyperlipidemia: Secondary | ICD-10-CM | POA: Diagnosis not present

## 2016-10-13 LAB — COMPREHENSIVE METABOLIC PANEL
A/G RATIO: 1.5 (ref 1.2–2.2)
ALBUMIN: 4.1 g/dL (ref 3.5–4.7)
ALT: 22 IU/L (ref 0–44)
AST: 28 IU/L (ref 0–40)
Alkaline Phosphatase: 52 IU/L (ref 39–117)
BUN / CREAT RATIO: 14 (ref 10–24)
BUN: 16 mg/dL (ref 8–27)
Bilirubin Total: 0.8 mg/dL (ref 0.0–1.2)
CALCIUM: 9.2 mg/dL (ref 8.6–10.2)
CO2: 25 mmol/L (ref 18–29)
CREATININE: 1.17 mg/dL (ref 0.76–1.27)
Chloride: 99 mmol/L (ref 96–106)
GFR, EST AFRICAN AMERICAN: 68 mL/min/{1.73_m2} (ref >59)
GFR, EST NON AFRICAN AMERICAN: 59 mL/min/{1.73_m2} — AB (ref >59)
GLOBULIN, TOTAL: 2.7 g/dL (ref 1.5–4.5)
Glucose: 112 mg/dL — ABNORMAL HIGH (ref 65–99)
POTASSIUM: 4.5 mmol/L (ref 3.5–5.2)
SODIUM: 139 mmol/L (ref 134–144)
TOTAL PROTEIN: 6.8 g/dL (ref 6.0–8.5)

## 2016-10-13 LAB — LIPID PANEL
CHOLESTEROL TOTAL: 151 mg/dL (ref 100–199)
Chol/HDL Ratio: 2.9 ratio units (ref 0.0–5.0)
HDL: 52 mg/dL (ref >39)
LDL CALC: 69 mg/dL (ref 0–99)
TRIGLYCERIDES: 151 mg/dL — AB (ref 0–149)
VLDL Cholesterol Cal: 30 mg/dL (ref 5–40)

## 2016-10-17 ENCOUNTER — Encounter: Payer: Self-pay | Admitting: Cardiovascular Disease

## 2016-10-17 ENCOUNTER — Ambulatory Visit (INDEPENDENT_AMBULATORY_CARE_PROVIDER_SITE_OTHER): Payer: Medicare Other | Admitting: Cardiovascular Disease

## 2016-10-17 VITALS — BP 120/70 | HR 68 | Ht 72.0 in | Wt 204.1 lb

## 2016-10-17 DIAGNOSIS — I10 Essential (primary) hypertension: Secondary | ICD-10-CM | POA: Diagnosis not present

## 2016-10-17 DIAGNOSIS — I351 Nonrheumatic aortic (valve) insufficiency: Secondary | ICD-10-CM | POA: Diagnosis not present

## 2016-10-17 DIAGNOSIS — I2510 Atherosclerotic heart disease of native coronary artery without angina pectoris: Secondary | ICD-10-CM | POA: Diagnosis not present

## 2016-10-17 NOTE — Patient Instructions (Signed)

## 2016-10-17 NOTE — Progress Notes (Signed)
Cardiology Office Note   Date:  10/17/2016   ID:  [name], DOB [birth year], MRN [id number]  PCP:  Sheral Flow, NP  Cardiologist:   Mertie Moores, MD , previous [facility] patient   Chief Complaint  Patient presents with  . Coronary Artery Disease   Problem list 1. Coronary artery disease 2. Mild systolic congestive heart failure due to coronary artery disease 3. Essential hypertension 4. Hyperlipidemia 5. Obstructive sleep apnea 6. Abdominal bloating    History of Present Illness: Gabriel [name] is a 82 y.o. male who presents for his CAD  I preformed several PCIs in the 1995s - 2000  Had a recent PCI in July 2015.   ( mid LAD)   Not getting much regular exercise .    Cath in July 2015:  IMPRESSIONS:  1. Normal left main coronary artery. 2. 90% focal stenosis in the mid left anterior descending artery. This was successfully treated with a 2.25 x 12 promus drug-eluting stent, postdilated to 2.6 mm in diameter. Previously placed stent in the more proximal portion of the LAD is widely patent. 3. Patent stents in the mid left circumflex artery. Mild to moderate disease in the circumflex system. 4. Mild to moderate disease right coronary artery system. The distal RCA stent has mild in-stent restenosis. 5. Mildly decreased left ventricular systolic function. LVEDP 19 mmHg. Ejection fraction 40-45%.  Sept. 6, 2017:  Doing well.   Had left knee arthroscopy recently.  No complications. No CP , no dyspnea BP is well controlled   October 17, 2016:  Doing well His knee is feeling better  Recently went to [place] to spring training    Past Medical History:  Diagnosis Date  . 1st degree AV block   . Aortic valve disease    a. Mild AS, AI by echo 01/2014.  Marland Kitchen CAD (coronary artery disease)    a. s/p PTCA 1995. b. MI in 2003 s/p stenting to LAD 2003, s/p stent to Cx 2003. c. prior rotablator to PTCA vessel. d. Abnl nuc 01/2014: s/p DES to LAD, mild-mod  nonobstructive disease otherwise.  . Carpal tunnel syndrome   . GERD (gastroesophageal reflux disease)   . Hyperlipidemia    a. Can only tolerate low dose statins.  . Hypertension   . Insomnia   . Ischemic cardiomyopathy    a. EF 40-45% by echo 02/04/14, 40-45% by cath 02/05/14 (similar to 2010).  . Kidney stone   . Neuropathy ([place])   . OSA (obstructive sleep apnea)    with upper airway resistancy  . RBBB   . Vertigo    Had Epley maneuver, vestibular rehab.    Past Surgical History:  Procedure Laterality Date  . CARDIAC CATHETERIZATION  2003   Following MI  . CATARACT EXTRACTION, BILATERAL  2004  . CHOLECYSTECTOMY  2009  . CORONARY ANGIOPLASTY  2003   LAD  . CORONARY STENT PLACEMENT  2003   LCX  . HERNIA REPAIR  1994   double  . KIDNEY STONE SURGERY  10/2005  . LEFT HEART CATH  02/05/2014  . LEFT HEART CATHETERIZATION WITH CORONARY ANGIOGRAM N/A 02/05/2014   Procedure: LEFT HEART CATHETERIZATION WITH CORONARY ANGIOGRAM;  Surgeon: Jettie Booze, MD;  Location: [facility] CATH LAB;  Service: Cardiovascular;  Laterality: N/A;  . LITHOTRIPSY  10/2005  . meniscus removal- knee Right   . PERCUTANEOUS CORONARY STENT INTERVENTION (PCI-S) Right 02/05/2014   Procedure: PERCUTANEOUS CORONARY STENT INTERVENTION (PCI-S);  Surgeon: Jettie Booze, MD;  Location: [facility] CATH LAB;  Service: Cardiovascular;  Laterality: Right;  . SHOULDER OPEN ROTATOR CUFF REPAIR  1990     Current Outpatient Prescriptions  Medication Sig Dispense Refill  . amLODipine (NORVASC) 5 MG tablet TAKE 1 TABLET BY MOUTH DAILY. 90 tablet 3  . aspirin EC 81 MG tablet Take 81 mg by mouth daily.    Marland Kitchen atorvastatin (LIPITOR) 10 MG tablet Take 1 tablet (10 mg total) by mouth daily. 90 tablet 2  . clopidogrel (PLAVIX) 75 MG tablet Take 1 tablet (75 mg total) by mouth daily. 90 tablet 3  . Coenzyme Q10 (CO Q 10 PO) Take 200 mg by mouth daily.     Marland Kitchen ezetimibe (ZETIA) 10 MG tablet Take 1 tablet (10 mg total) by mouth daily. 90  tablet 3  . gabapentin (NEURONTIN) 800 MG tablet TAKE 1 TABLET BY MOUTH 3 TIMES DAILY. 270 tablet 0  . lisinopril (PRINIVIL,ZESTRIL) 20 MG tablet Take 1 tablet (20 mg total) by mouth daily. 30 tablet 9  . loratadine (CLARITIN) 10 MG tablet Take 10 mg by mouth daily as needed. Reported on 01/21/2016    . Magnesium 500 MG TABS Take 500 mg by mouth daily.    . metoprolol succinate (TOPROL-XL) 25 MG 24 hr tablet Take 1 tablet (25 mg total) by mouth daily. 90 tablet 2  . Multiple Vitamins-Minerals (MULTIVITAMINS THER. W/MINERALS) TABS Take 1 tablet by mouth daily.    . nitroGLYCERIN (NITROSTAT) 0.4 MG SL tablet Place 1 tablet (0.4 mg total) under the tongue every 5 (five) minutes as needed for chest pain. 30 tablet 1  . Omega-3 Fatty Acids (OMEGA 3 PO) Take 1 tablet by mouth daily.    . Probiotic Product (PROBIOTIC PO) Take 10 mg by mouth daily.    . sildenafil (REVATIO) 20 MG tablet Take 2 tablets (40 mg total) by mouth daily as needed (erectile dysfunction). 50 tablet 1  . Wheat Dextrin (BENEFIBER PO) Take 5 g by mouth daily.    Marland Kitchen zolpidem (AMBIEN) 10 MG tablet Take 1 tablet (10 mg total) by mouth at bedtime as needed. 90 tablet 0   No current facility-administered medications for this visit.     Allergies:   Lipitor [atorvastatin calcium] and Mevacor [lovastatin]    Social History:  The patient  reports that he has quit smoking. He has never used smokeless tobacco. He reports that he drinks alcohol. He reports that he does not use drugs.   Family History:  The patient's family history includes Diabetes in his mother; Heart Problems in his mother; Other in his sister; Parkinson's disease (age of onset: 3) in his father.    ROS:  Please see the history of present illness.    Review of Systems: Constitutional:  denies fever, chills, diaphoresis, appetite change and fatigue.  HEENT: denies photophobia, eye pain, redness, hearing loss, ear pain, congestion, sore throat, rhinorrhea, sneezing,  neck pain, neck stiffness and tinnitus.  Respiratory: denies SOB, DOE, cough, chest tightness, and wheezing.  Cardiovascular: denies chest pain, palpitations and leg swelling.  Gastrointestinal: denies nausea, vomiting, abdominal pain, diarrhea, constipation, blood in stool.  Genitourinary: denies dysuria, urgency, frequency, hematuria, flank pain and difficulty urinating.  Musculoskeletal: denies  myalgias, back pain, joint swelling, arthralgias and gait problem.   Skin: denies pallor, rash and wound.  Neurological: denies dizziness, seizures, syncope, weakness, light-headedness, numbness and headaches.   Hematological: denies adenopathy, easy bruising, personal or family bleeding history.  Psychiatric/ Behavioral: denies suicidal ideation, mood changes, confusion,  nervousness, sleep disturbance and agitation.       All other systems are reviewed and negative.    PHYSICAL EXAM: VS:  BP 120/70 (BP Location: Left Arm, Patient Position: Sitting, Cuff Size: Normal)   Pulse 68   Ht 6' (1.829 m)   Wt 204 lb 1.9 oz (92.6 kg)   SpO2 94%   BMI 27.68 kg/m  , BMI Body mass index is 27.68 kg/m. GEN: Well nourished, well developed, in no acute distress  HEENT: normal  Neck: no JVD, carotid bruits, or masses Cardiac: RRR;  Soft 1-2 / 6 systolic  murmur,  No rubs, or gallops,no edema  Respiratory:  clear to auscultation bilaterally, normal work of breathing GI: soft, nontender, nondistended, + BS MS: no deformity or atrophy  Skin: warm and dry, no rash Neuro:  Strength and sensation are intact Psych: normal   EKG:  EKG is ordered today. The ekg ordered today demonstrates NSR at 68.  RBBB , no changes from previous tracing    Recent Labs: 10/13/2016: ALT 22; BUN 16; Creatinine, Ser 1.17; Potassium 4.5; Sodium 139    Lipid Panel    Component Value Date/Time   CHOL 151 10/13/2016 0813   TRIG 151 (H) 10/13/2016 0813   HDL 52 10/13/2016 0813   CHOLHDL 2.9 10/13/2016 0813   CHOLHDL  2.3 04/06/2016 0920   VLDL 25 04/06/2016 0920   LDLCALC 69 10/13/2016 0813   LDLDIRECT 145.5 09/16/2013 0811      Wt Readings from Last 3 Encounters:  10/17/16 204 lb 1.9 oz (92.6 kg)  09/20/16 206 lb (93.4 kg)  05/25/16 205 lb 6.4 oz (93.2 kg)      Other studies Reviewed: Additional studies/ records that were reviewed today include: . Review of the above records demonstrates:    ASSESSMENT AND PLAN:  1. Coronary artery disease - has had previous stenting of all 3 coronary arteries. s/p PCI of the mid LAD in July 2015.   No angina  Continue current medications. Last lipids looked good    2. Mild systolic congestive heart failure due to coronary artery disease - no episodes of shortness breath. 3. Essential hypertension - blood pressures well-controlled.  4. Hyperlipidemia- we'll check fasting lipids, basic metabolic profile, liver enzymes today.  5. Aortic stenosis:   With mild AI Will continue to follow   5. Obstructive sleep apnea  6. Abdominal bloating    Current medicines are reviewed at length with the patient today.  The patient does not have concerns regarding medicines.  The following changes have been made:  no change  Labs/ tests ordered today include:   No orders of the defined types were placed in this encounter.    Disposition:   FU with me in 6 months      Mertie Moores, MD  10/17/2016 11:24 AM    [facility] [address], [place], [place]  [address] Phone: [phone]; Fax: [phone]

## 2016-10-26 ENCOUNTER — Other Ambulatory Visit: Payer: Self-pay | Admitting: Cardiovascular Disease

## 2016-11-28 ENCOUNTER — Encounter: Payer: Self-pay | Admitting: Internal Medicine

## 2016-11-30 ENCOUNTER — Other Ambulatory Visit: Payer: Self-pay | Admitting: Cardiovascular Disease

## 2016-12-09 ENCOUNTER — Other Ambulatory Visit: Payer: Self-pay

## 2016-12-09 DIAGNOSIS — G4700 Insomnia, unspecified: Secondary | ICD-10-CM

## 2016-12-09 MED ORDER — ZOLPIDEM TARTRATE 10 MG PO TABS: 10.0000 mg | ORAL_TABLET | Freq: Every evening | ORAL | 0 refills | Status: DC | PRN

## 2016-12-09 NOTE — Telephone Encounter (Signed)
Noted. Please phone in prescription for Ambien 10 mg per his chart. #90, no refills.

## 2016-12-09 NOTE — Telephone Encounter (Signed)
Called in to [place], [place]: [phone]

## 2016-12-09 NOTE — Telephone Encounter (Signed)
Last filled 09/16/2016 #90-- please advise--reminder letter mailed to pt letting him know that his wellness exam will be due after 01/20/2017

## 2016-12-21 ENCOUNTER — Other Ambulatory Visit: Payer: Self-pay | Admitting: Neurology

## 2017-01-02 ENCOUNTER — Encounter: Payer: Self-pay | Admitting: *Deleted

## 2017-01-11 ENCOUNTER — Telehealth: Payer: Self-pay | Admitting: *Deleted

## 2017-01-11 ENCOUNTER — Encounter: Payer: Self-pay | Admitting: Internal Medicine

## 2017-01-11 ENCOUNTER — Other Ambulatory Visit (INDEPENDENT_AMBULATORY_CARE_PROVIDER_SITE_OTHER): Payer: Medicare Other

## 2017-01-11 ENCOUNTER — Ambulatory Visit (INDEPENDENT_AMBULATORY_CARE_PROVIDER_SITE_OTHER): Payer: Medicare Other | Admitting: Internal Medicine

## 2017-01-11 VITALS — BP 130/68 | HR 74 | Ht 72.5 in | Wt 206.0 lb

## 2017-01-11 DIAGNOSIS — R140 Abdominal distension (gaseous): Secondary | ICD-10-CM

## 2017-01-11 DIAGNOSIS — Z86010 Personal history of colonic polyps: Secondary | ICD-10-CM | POA: Diagnosis not present

## 2017-01-11 DIAGNOSIS — R109 Unspecified abdominal pain: Secondary | ICD-10-CM | POA: Diagnosis not present

## 2017-01-11 LAB — IGA: IgA: 300 mg/dL (ref 68–378)

## 2017-01-11 NOTE — Progress Notes (Signed)
Patient ID: Gabriel [name], male   DOB: Oct 12, 1934, 82 y.o.   MRN: [id number] HPI: Gabriel [name] is an 82 year old male with a history of abdominal bloating, CAD with history of PCI, mild aortic stenosis, hyperlipidemia, hypertension, sleep apnea and colon polyps who is seen in consultation at the request of Alma Friendly, NP for reevaluation of abdominal bloating and to consider surveillance colonoscopy. He is here today with his wife.  He reports that he was previously seen by Dr. Cristina Gong for his GI care and also had a visit with Dr. Donna Christen at [facility] in 2015. The deep visit was for second opinion regarding abdominal bloating. I have reviewed this note. It was presumed that he may have had bacterial overgrowth and rifaximin was prescribed. This was too expensive but he does recall being prescribed 2 antibiotics which did not seem to help his abdominal bloating.  He reports the symptoms been fairly stable. Dates back several years and seem to start after he had a gastrointestinal illness or possibly even food poisoning. At that time it was associated with loose stools. CT scan was performed in April 2014 which did not show overt GI pathology. Last endoscopy and colonoscopy were performed in November 2012. Endoscopy showed mild antral gastritis was otherwise unremarkable. He had one tubular adenoma removed from his descending colon. Diverticulosis was found in the colon.  He reports that he does have intermittent mid and lower abdominal bloating associated with increased flatulence. This is also associated with an uncomfortableness or crampy sensation in his bilateral lower abdomen. He has not been able to identify specific dietary triggers. He does use Benefiber daily with his coffee. He rarely drinks carbonated beverages. He does add a sugar-free tea flavored product to his water. Bowel movements are pretty stable for him which is every 1-2 days. He has not seen blood in his stool or melena. He's used daily  probiotic, align, for several years without interruption. He has not seen blood in his stool or melena. He has occasional reflux relieved by Tums but denies dysphagia and odynophagia. No nausea or vomiting. He does use his CPAP regularly.  Prior attempt at [place] did not seem to help bloating.  Family history of colon cancer or inflammatory bowel disease. He's married and his wife is with him today. They have 4 adult children. He does not drink alcohol or use tobacco currently.  Past Medical History:  Diagnosis Date  . 1st degree AV block   . Aortic valve disease    a. Mild AS, AI by echo 01/2014.  Marland Kitchen CAD (coronary artery disease)    a. s/p PTCA 1995. b. MI in 2003 s/p stenting to LAD 2003, s/p stent to Cx 2003. c. prior rotablator to PTCA vessel. d. Abnl nuc 01/2014: s/p DES to LAD, mild-mod nonobstructive disease otherwise.  . Carpal tunnel syndrome   . GERD (gastroesophageal reflux disease)   . Hyperlipidemia    a. Can only tolerate low dose statins.  . Hypertension   . Insomnia   . Ischemic cardiomyopathy    a. EF 40-45% by echo 02/04/14, 40-45% by cath 02/05/14 (similar to 2010).  . Kidney stone   . Neuropathy   . OSA (obstructive sleep apnea)    with upper airway resistancy  . RBBB   . Tubular adenoma of colon   . Vertigo    Had Epley maneuver, vestibular rehab.    Past Surgical History:  Procedure Laterality Date  . CARDIAC CATHETERIZATION  2003   Following MI  .  CATARACT EXTRACTION, BILATERAL  2004  . CHOLECYSTECTOMY  2009  . CORONARY ANGIOPLASTY  2003   LAD  . CORONARY STENT PLACEMENT  2003   LCX  . HERNIA REPAIR  1994   double  . KIDNEY STONE SURGERY  10/2005  . LEFT HEART CATH  02/05/2014  . LEFT HEART CATHETERIZATION WITH CORONARY ANGIOGRAM N/A 02/05/2014   Procedure: LEFT HEART CATHETERIZATION WITH CORONARY ANGIOGRAM;  Surgeon: Jettie Booze, MD;  Location: [facility] CATH LAB;  Service: Cardiovascular;  Laterality: N/A;  . LITHOTRIPSY  10/2005  . meniscus removal- knee  Right   . PERCUTANEOUS CORONARY STENT INTERVENTION (PCI-S) Right 02/05/2014   Procedure: PERCUTANEOUS CORONARY STENT INTERVENTION (PCI-S);  Surgeon: Jettie Booze, MD;  Location: [facility] CATH LAB;  Service: Cardiovascular;  Laterality: Right;  . SHOULDER OPEN ROTATOR CUFF REPAIR  1990    Outpatient Medications Prior to Visit  Medication Sig Dispense Refill  . amLODipine (NORVASC) 5 MG tablet TAKE 1 TABLET BY MOUTH DAILY. 90 tablet 3  . aspirin EC 81 MG tablet Take 81 mg by mouth daily.    Marland Kitchen atorvastatin (LIPITOR) 10 MG tablet TAKE 1 TABLET BY MOUTH DAILY. 90 tablet 2  . clopidogrel (PLAVIX) 75 MG tablet Take 1 tablet (75 mg total) by mouth daily. 90 tablet 3  . Coenzyme Q10 (CO Q 10 PO) Take 200 mg by mouth daily.     Marland Kitchen ezetimibe (ZETIA) 10 MG tablet TAKE 1 TABLET BY MOUTH DAILY. 90 tablet 3  . gabapentin (NEURONTIN) 800 MG tablet TAKE 1 TABLET BY MOUTH 3 TIMES DAILY. 270 tablet 1  . lisinopril (PRINIVIL,ZESTRIL) 20 MG tablet TAKE 1 TABLET (20 MG TOTAL) BY MOUTH DAILY. 90 tablet 3  . loratadine (CLARITIN) 10 MG tablet Take 10 mg by mouth daily as needed. Reported on 01/21/2016    . Magnesium 500 MG TABS Take 500 mg by mouth daily.    . metoprolol succinate (TOPROL-XL) 25 MG 24 hr tablet Take 1 tablet (25 mg total) by mouth daily. 90 tablet 2  . Multiple Vitamins-Minerals (MULTIVITAMINS THER. W/MINERALS) TABS Take 1 tablet by mouth daily.    . nitroGLYCERIN (NITROSTAT) 0.4 MG SL tablet Place 1 tablet (0.4 mg total) under the tongue every 5 (five) minutes as needed for chest pain. 30 tablet 1  . Omega-3 Fatty Acids (OMEGA 3 PO) Take 1 tablet by mouth daily.    . Probiotic Product (PROBIOTIC PO) Take 10 mg by mouth daily.    . sildenafil (REVATIO) 20 MG tablet Take 2 tablets (40 mg total) by mouth daily as needed (erectile dysfunction). 50 tablet 1  . Wheat Dextrin (BENEFIBER PO) Take 5 g by mouth daily.    Marland Kitchen zolpidem (AMBIEN) 10 MG tablet Take 1 tablet (10 mg total) by mouth at bedtime as  needed. 90 tablet 0   No facility-administered medications prior to visit.     Allergies  Allergen Reactions  . Lipitor [Atorvastatin Calcium] Other (See Comments)    Muscle pain--Pt stated he can tolerate a low dose  . Mevacor [Lovastatin] Other (See Comments)    "makes me feel loopy"    Family History  Problem Relation Age of Onset  . Heart Problems Mother   . Diabetes Mother   . Parkinson's disease Father 60  . Other Sister        Muscular degeneration  . Heart attack Neg Hx   . Stroke Neg Hx     Social History  Substance Use Topics  . Smoking  status: Former Research scientist (life sciences)  . Smokeless tobacco: Never Used     Comment: 1968  . Alcohol use 0.0 oz/week     Comment: rarely    ROS: As per history of present illness, otherwise negative  BP 130/68   Pulse 74   Ht 6' 0.5" (1.842 m)   Wt 206 lb (93.4 kg)   BMI 27.55 kg/m  Constitutional: Well-developed and well-nourished. No distress. HEENT: Normocephalic and atraumatic. Oropharynx is clear and moist. Conjunctivae are normal.  No scleral icterus. Neck: Neck supple. Trachea midline. Cardiovascular: Normal rate, regular rhythm and intact distal pulses. 2/6 SEM Pulmonary/chest: Effort normal and breath sounds normal. No wheezing, rales or rhonchi. Abdominal: Soft, nontender, nondistended. Bowel sounds active throughout. There are no masses palpable. No hepatosplenomegaly. Diastases recti Extremities: no clubbing, cyanosis, or edema Neurological: Alert and oriented to person place and time. Skin: Skin is warm and dry.  Psychiatric: Normal mood and affect. Behavior is normal.  RELEVANT LABS  CMP     Component Value Date/Time   NA 139 10/13/2016 0813   K 4.5 10/13/2016 0813   CL 99 10/13/2016 0813   CO2 25 10/13/2016 0813   GLUCOSE 112 (H) 10/13/2016 0813   GLUCOSE 104 (H) 04/06/2016 0920   BUN 16 10/13/2016 0813   CREATININE 1.17 10/13/2016 0813   CREATININE 1.12 04/06/2016 0920   CALCIUM 9.2 10/13/2016 0813   PROT  6.8 10/13/2016 0813   ALBUMIN 4.1 10/13/2016 0813   AST 28 10/13/2016 0813   ALT 22 10/13/2016 0813   ALKPHOS 52 10/13/2016 0813   BILITOT 0.8 10/13/2016 0813   GFRNONAA 39 (L) 10/13/2016 0813   GFRAA 68 10/13/2016 0813   CT 2014 reviewed  ASSESSMENT/PLAN: 82 year old male with a history of abdominal bloating, CAD with history of PCI, mild aortic stenosis, hyperlipidemia, hypertension, sleep apnea and colon polyps who is seen in consultation at the request of Alma Friendly, NP for reevaluation of abdominal bloating and to consider surveillance colonoscopy.  1. Abdominal bloating -- I asked that he try to avoid artificial sweeteners specifically sorbitol. This can contribute to abdominal bloating. Check H. pylori stool antigen. Check celiac blood panel. Perform lactulose breath testing for formal evaluation of SIBO. This symptom is stable over time and intermittent which argues against overt or concerning pathology.  2. History of colon polyps -- small adenoma 5-1/2 years ago. Repeat surveillance colonoscopy recommended at this time. We discussed the risks, benefits and alternatives and he is agreeable and wishes to proceed.      FA:OZHYQ, Leticia Penna, [place], [place] [id number]

## 2017-01-11 NOTE — Telephone Encounter (Signed)
   BROGAN MARTIS 1935/02/03 [id number]  Dear Dr Acie Fredrickson:  We have scheduled the above named patient for a(n) colonoscopy procedure. Our records show that (s)he is on anticoagulation therapy.  Please advise as to whether the patient may come off their therapy of plavix 5 days prior to their procedure which is scheduled for 02/15/17.  Please route your response to Dixon Boos, CMA.  Sincerely,  [facility]

## 2017-01-11 NOTE — Patient Instructions (Signed)
You have been scheduled for a colonoscopy. Please follow written instructions given to you at your visit today.  Please pick up your prep supplies at the pharmacy within the next 1-3 days. If you use inhalers (even only as needed), please bring them with you on the day of your procedure. Your physician has requested that you go to www.startemmi.com and enter the access code given to you at your visit today. This web site gives a general overview about your procedure. However, you should still follow specific instructions given to you by our office regarding your preparation for the procedure.  Your physician has requested that you go to the basement for the following lab work before leaving today: Celiac panel, h pylori stool   We have ordered a breath test to be completed by [facility]. You should hear from this lab within the next week. If you do not, please contact Dottie at [phone].  If you are age 80 or older, your body mass index should be between 23-30. Your Body mass index is 27.55 kg/m. If this is out of the aforementioned range listed, please consider follow up with your Primary Care Provider.  If you are age 41 or younger, your body mass index should be between 19-25. Your Body mass index is 27.55 kg/m. If this is out of the aformentioned range listed, please consider follow up with your Primary Care Provider.

## 2017-01-12 LAB — TISSUE TRANSGLUTAMINASE, IGA: Tissue Transglutaminase Ab, IgA: 1 U/mL (ref <4)

## 2017-01-12 NOTE — Telephone Encounter (Signed)
Pt may hold Plavix for 5 days prior to procedure I would prefer that he continue ASA 81 mg a day

## 2017-01-12 NOTE — Telephone Encounter (Signed)
I have spoken to patient to advise that per Dr Acie Fredrickson, he may hold plavix 5 days prior to procedure and continue aspirin 81 mg daily. Patient verbalizes understanding of this.

## 2017-01-13 ENCOUNTER — Other Ambulatory Visit: Payer: Medicare Other

## 2017-01-13 DIAGNOSIS — R140 Abdominal distension (gaseous): Secondary | ICD-10-CM | POA: Diagnosis not present

## 2017-01-13 DIAGNOSIS — R109 Unspecified abdominal pain: Secondary | ICD-10-CM

## 2017-01-16 LAB — HELICOBACTER PYLORI  SPECIAL ANTIGEN: H. PYLORI Antigen: NOT DETECTED

## 2017-01-18 ENCOUNTER — Telehealth: Payer: Self-pay | Admitting: *Deleted

## 2017-01-18 NOTE — Telephone Encounter (Signed)
I have confirmed with Dorothea Ogle at [facility] that they have now received Small intestinal bacterial overgrowth lactulose breath test requsition request from.

## 2017-01-23 ENCOUNTER — Other Ambulatory Visit: Payer: Self-pay | Admitting: Cardiovascular Disease

## 2017-01-24 ENCOUNTER — Encounter: Payer: Self-pay | Admitting: Internal Medicine

## 2017-01-24 DIAGNOSIS — R69 Illness, unspecified: Secondary | ICD-10-CM | POA: Diagnosis not present

## 2017-02-02 ENCOUNTER — Telehealth: Payer: Self-pay | Admitting: *Deleted

## 2017-02-02 MED ORDER — AMOXICILLIN-POT CLAVULANATE 500-125 MG PO TABS: 1.0000 | ORAL_TABLET | Freq: Three times a day (TID) | ORAL | 0 refills | Status: AC

## 2017-02-02 NOTE — Telephone Encounter (Signed)
Dr Hilarie Fredrickson has received patient's Small Intestinal Bacterial Overgrowth testing back from [facility] laboratories.   He notes: "SIBO test positive; explains bloating. Please treat with Augmentin 500 mg three times daily x 10 days. Office follow up next available. JMP"  I have spoken to Dr Hilarie Fredrickson who would like patient to keep scheduled colonoscopy on 02/15/17 for follow up on history of colon polyps. He would also like patient to be treated with the Augmenting AFTER colonoscopy since prepping for colonoscopy will likely alter his bowels even more in the near future. Once bowel habits have returned back to "his" normal, he may start Augmentin for the SIBO.  I have left a voicemail for patient to call back. Augmentin has been sent to pharmacy and follow up has been scheduled for Monday, 04/10/17 @ 8:45 am.

## 2017-02-02 NOTE — Progress Notes (Signed)
Subjective:   Gabriel [name] is a 82 y.o. male who presents for Medicare Annual/Subsequent preventive examination.  Review of Systems:  No ROS.  Medicare Wellness Visit. Additional risk factors are reflected in the social history.  Cardiac Risk Factors include: advanced age (>54men, >74 women);dyslipidemia;hypertension;male gender     Objective:    Vitals: BP 138/78   Pulse 78   Ht 5' 11.5" (1.816 m)   Wt 207 lb 8 oz (94.1 kg)   SpO2 96%   BMI 28.54 kg/m   Body mass index is 28.54 kg/m.  Tobacco History  Smoking Status  . Former Smoker  Smokeless Tobacco  . Never Used    Comment: 1968     Counseling given: Not Answered   Past Medical History:  Diagnosis Date  . 1st degree AV block   . Aortic valve disease    a. Mild AS, AI by echo 01/2014.  Marland Kitchen CAD (coronary artery disease)    a. s/p PTCA 1995. b. MI in 2003 s/p stenting to LAD 2003, s/p stent to Cx 2003. c. prior rotablator to PTCA vessel. d. Abnl nuc 01/2014: s/p DES to LAD, mild-mod nonobstructive disease otherwise.  . Carpal tunnel syndrome   . GERD (gastroesophageal reflux disease)   . Hyperlipidemia    a. Can only tolerate low dose statins.  . Hypertension   . Insomnia   . Ischemic cardiomyopathy    a. EF 40-45% by echo 02/04/14, 40-45% by cath 02/05/14 (similar to 2010).  . Kidney stone   . Neuropathy   . OSA (obstructive sleep apnea)    with upper airway resistancy  . RBBB   . Tubular adenoma of colon   . Vertigo    Had Epley maneuver, vestibular rehab.   Past Surgical History:  Procedure Laterality Date  . CARDIAC CATHETERIZATION  2003   Following MI  . CATARACT EXTRACTION, BILATERAL  2004  . CHOLECYSTECTOMY  2009  . CORONARY ANGIOPLASTY  2003   LAD  . CORONARY STENT PLACEMENT  2003   LCX  . HERNIA REPAIR  1994   double  . KIDNEY STONE SURGERY  10/2005  . KNEE SURGERY Left   . LEFT HEART CATH  02/05/2014  . LEFT HEART CATHETERIZATION WITH CORONARY ANGIOGRAM N/A 02/05/2014   Procedure: LEFT  HEART CATHETERIZATION WITH CORONARY ANGIOGRAM;  Surgeon: Jettie Booze, MD;  Location: [facility] CATH LAB;  Service: Cardiovascular;  Laterality: N/A;  . LITHOTRIPSY  10/2005  . meniscus removal- knee Right   . PERCUTANEOUS CORONARY STENT INTERVENTION (PCI-S) Right 02/05/2014   Procedure: PERCUTANEOUS CORONARY STENT INTERVENTION (PCI-S);  Surgeon: Jettie Booze, MD;  Location: [facility] CATH LAB;  Service: Cardiovascular;  Laterality: Right;  . SHOULDER OPEN ROTATOR CUFF REPAIR  1990   Family History  Problem Relation Age of Onset  . Heart Problems Mother   . Diabetes Mother   . Parkinson's disease Father 62  . Other Sister        Muscular degeneration  . Heart attack Neg Hx   . Stroke Neg Hx    History  Sexual Activity  . Sexual activity: Yes    Outpatient Encounter Prescriptions as of 02/14/2017  Medication Sig  . amLODipine (NORVASC) 5 MG tablet TAKE 1 TABLET BY MOUTH DAILY.  Marland Kitchen aspirin EC 81 MG tablet Take 81 mg by mouth daily.  Marland Kitchen atorvastatin (LIPITOR) 10 MG tablet TAKE 1 TABLET BY MOUTH DAILY.  . cetirizine (ZYRTEC) 10 MG tablet Take 10 mg by mouth daily.  Marland Kitchen  Coenzyme Q10 (CO Q 10 PO) Take 200 mg by mouth daily.   . cyanocobalamin 1000 MCG tablet Take 1,000 mcg by mouth daily.  Marland Kitchen ezetimibe (ZETIA) 10 MG tablet TAKE 1 TABLET BY MOUTH DAILY.  . fluticasone (FLONASE) 50 MCG/ACT nasal spray Place 1 spray into both nostrils daily.  Marland Kitchen gabapentin (NEURONTIN) 800 MG tablet TAKE 1 TABLET BY MOUTH 3 TIMES DAILY.  Marland Kitchen lisinopril (PRINIVIL,ZESTRIL) 20 MG tablet TAKE 1 TABLET (20 MG TOTAL) BY MOUTH DAILY.  Marland Kitchen loratadine (CLARITIN) 10 MG tablet Take 10 mg by mouth daily as needed. Reported on 01/21/2016  . Magnesium 500 MG TABS Take 500 mg by mouth daily.  . metoprolol succinate (TOPROL-XL) 25 MG 24 hr tablet Take 1 tablet (25 mg total) by mouth daily.  . Multiple Vitamins-Minerals (MULTIVITAMINS THER. W/MINERALS) TABS Take 1 tablet by mouth daily.  . nitroGLYCERIN (NITROSTAT) 0.4 MG SL tablet  Place 1 tablet (0.4 mg total) under the tongue every 5 (five) minutes as needed for chest pain.  . Omega-3 Fatty Acids (OMEGA 3 PO) Take 1 tablet by mouth daily.  . Probiotic Product (PROBIOTIC PO) Take 10 mg by mouth daily.  . sildenafil (REVATIO) 20 MG tablet Take 2 tablets (40 mg total) by mouth daily as needed (erectile dysfunction).  . Wheat Dextrin (BENEFIBER PO) Take 5 g by mouth daily.  Marland Kitchen zolpidem (AMBIEN) 10 MG tablet Take 1 tablet (10 mg total) by mouth at bedtime as needed.  . clopidogrel (PLAVIX) 75 MG tablet TAKE 1 TABLET BY MOUTH DAILY. (Patient not taking: Reported on 02/14/2017)   No facility-administered encounter medications on file as of 02/14/2017.     Activities of Daily Living In your present state of health, do you have any difficulty performing the following activities: 02/14/2017  Hearing? N  Vision? N  Difficulty concentrating or making decisions? N  Walking or climbing stairs? N  Dressing or bathing? N  Doing errands, shopping? N  Preparing Food and eating ? N  Using the Toilet? N  In the past six months, have you accidently leaked urine? N  Do you have problems with loss of bowel control? Y  Managing your Medications? N  Managing your Finances? N  Housekeeping or managing your Housekeeping? N  Some recent data might be hidden    Patient Care Team: Pleas Koch, NP as PCP - General (Internal Medicine) Opthamology, Lady Gary (Ophthalmology) Luberta Mutter, MD as Consulting Physician (Ophthalmology) Luberta Mutter, MD as Consulting Physician (Ophthalmology) Latanya Maudlin, MD as Consulting Physician (Orthopedic Surgery) Pyrtle, Lajuan Lines, MD as Consulting Physician (Gastroenterology) Nahser, Wonda Cheng, MD as Consulting Physician (Cardiology) Kathie Rhodes, MD as Consulting Physician (Urology) Danella Sensing, MD as Consulting Physician (Dermatology) Dohmeier, Asencion Partridge, MD as Consulting Physician (Neurology)   Assessment:    Physical assessment  deferred to PCP.  Exercise Activities and Dietary recommendations Current Exercise Habits: The patient has a physically strenous job, but has no regular exercise apart from work. (Stays active working on outside on projects at home and at [employer] Exercise limited by: orthopedic condition(s) (L knee pain)  Goals    . Weight (lb) < 200 lb (90.7 kg) (pt-stated)          Starting 02/14/2017, I will cut down on sweets in an effort to lose weight.      Fall Risk Fall Risk  02/14/2017 01/21/2016 09/21/2015  Falls in the past year? No No No   Depression Screen PHQ 2/9 Scores 02/14/2017 01/21/2016 09/21/2015  PHQ - 2  Score 0 0 0    Cognitive Function PLEASE NOTE: A Mini-Cog screen was completed. Maximum score is 20. A value of 0 denotes this part of Folstein MMSE was not completed or the patient failed this part of the Mini-Cog screening.   Mini-Cog Screening Orientation to Time - Max 5 pts Orientation to Place - Max 5 pts Registration - Max 3 pts Recall - Max 3 pts Language Repeat - Max 1 pts Language Follow 3 Step Command - Max 3 pts      Mini-Cog - 02/14/17 1057    Normal clock drawing test? yes   How many words correct? 2      MMSE - Mini Mental State Exam 02/14/2017  Orientation to time 5  Orientation to Place 5  Registration 3  Attention/ Calculation 0  Recall 2  Language- name 2 objects 0  Language- repeat 1  Language- follow 3 step command 3  Language- read & follow direction 0  Write a sentence 0  Copy design 0  Total score 19        Immunization History  Administered Date(s) Administered  . Pneumococcal Conjugate-13 01/21/2016  . Pneumococcal Polysaccharide-23 02/06/2014   Screening Tests Health Maintenance  Topic Date Due  . TETANUS/TDAP  08/12/1953  . INFLUENZA VACCINE  03/01/2017  . PNA vac Low Risk Adult  Completed      Plan:    Follow-up w/ PCP as scheduled.  I have personally reviewed and noted the following in the patient's chart:    . Medical and social history . Use of alcohol, tobacco or illicit drugs  . Current medications and supplements . Functional ability and status . Nutritional status . Physical activity . Advanced directives . List of other physicians . Vitals . Screenings to include cognitive, depression, and falls . Referrals and appointments  In addition, I have reviewed and discussed with patient certain preventive protocols, quality metrics, and best practice recommendations. A written personalized care plan for preventive services as well as general preventive health recommendations were provided to patient.     Dorrene German, RN  02/14/2017

## 2017-02-02 NOTE — Progress Notes (Signed)
PCP notes:   Health maintenance: Tetanus- due, patient to check with insurance regarding coverage.   Abnormal screenings: None   Patient concerns:  1) ? Sinus infection. He's had sinus pressure and nasal congestion for several months. Using Flonase without relief. 2) Occasional lightheaded when standing up/changing positions too quickly. He feels this is related to his sinus problem.   Nurse concerns: None   Next PCP appt: 02/21/17 @ 8:15am

## 2017-02-03 NOTE — Telephone Encounter (Signed)
Informed patient to results of SIBO testing and and Dr. Vena Rua recommendations. Patient verbalized understanding. Patient states he will not start the Augmentin until after his bowels are back to normal after his colonoscopy. Patient states he saw the follow up appt on mychart and already wrote it on his calendar.

## 2017-02-09 ENCOUNTER — Other Ambulatory Visit: Payer: Self-pay | Admitting: Primary Care

## 2017-02-09 DIAGNOSIS — R7303 Prediabetes: Secondary | ICD-10-CM

## 2017-02-14 ENCOUNTER — Ambulatory Visit (INDEPENDENT_AMBULATORY_CARE_PROVIDER_SITE_OTHER): Payer: Medicare Other

## 2017-02-14 ENCOUNTER — Other Ambulatory Visit (INDEPENDENT_AMBULATORY_CARE_PROVIDER_SITE_OTHER): Payer: Medicare Other

## 2017-02-14 VITALS — BP 138/78 | HR 78 | Ht 71.5 in | Wt 207.5 lb

## 2017-02-14 DIAGNOSIS — Z0000 Encounter for general adult medical examination without abnormal findings: Secondary | ICD-10-CM

## 2017-02-14 DIAGNOSIS — R7303 Prediabetes: Secondary | ICD-10-CM

## 2017-02-14 LAB — BASIC METABOLIC PANEL
BUN: 16 mg/dL (ref 6–23)
CALCIUM: 9.4 mg/dL (ref 8.4–10.5)
CO2: 28 meq/L (ref 19–32)
CREATININE: 1.22 mg/dL (ref 0.40–1.50)
Chloride: 102 mEq/L (ref 96–112)
GFR: 60.67 mL/min (ref >60.00)
Glucose, Bld: 114 mg/dL — ABNORMAL HIGH (ref 70–99)
Potassium: 4.5 mEq/L (ref 3.5–5.1)
Sodium: 138 mEq/L (ref 135–145)

## 2017-02-14 LAB — HEMOGLOBIN A1C: Hgb A1c MFr Bld: 6 % (ref 4.6–6.5)

## 2017-02-14 NOTE — Patient Instructions (Addendum)
Gabriel [name] , Thank you for taking time to come for your Medicare Wellness Visit. I appreciate your ongoing commitment to your health goals. Please review the following plan we discussed and let me know if I can assist you in the future.   These are the goals we discussed: Goals    . Weight (lb) < 200 lb (90.7 kg) (pt-stated)          Starting 02/14/2017, I will cut down on sweets in an effort to lose weight.       This is a list of the screening recommended for you and due dates:  Health Maintenance  Topic Date Due  . Tetanus Vaccine  08/12/1953  . Flu Shot  03/01/2017  . Pneumonia vaccines  Completed   Preventive Care for Adults  A healthy lifestyle and preventive care can promote health and wellness. Preventive health guidelines for adults include the following key practices.  . A routine yearly physical is a good way to check with your health care provider about your health and preventive screening. It is a chance to share any concerns and updates on your health and to receive a thorough exam.  . Visit your dentist for a routine exam and preventive care every 6 months. Brush your teeth twice a day and floss once a day. Good oral hygiene prevents tooth decay and gum disease.  . The frequency of eye exams is based on your age, health, family medical history, use  of contact lenses, and other factors. Follow your health care provider's ecommendations for frequency of eye exams.  . Eat a healthy diet. Foods like vegetables, fruits, whole grains, low-fat dairy products, and lean protein foods contain the nutrients you need without too many calories. Decrease your intake of foods high in solid fats, added sugars, and salt. Eat the right amount of calories for you. Get information about a proper diet from your health care provider, if necessary.  . Regular physical exercise is one of the most important things you can do for your health. Most adults should get at least 150 minutes of  moderate-intensity exercise (any activity that increases your heart rate and causes you to sweat) each week. In addition, most adults need muscle-strengthening exercises on 2 or more days a week.  Silver Sneakers may be a benefit available to you. To determine eligibility, you may visit the website: www.silversneakers.com or contact program at 913-198-8138 Mon-Fri between 8AM-8PM.   . Maintain a healthy weight. The body mass index (BMI) is a screening tool to identify possible weight problems. It provides an estimate of body fat based on height and weight. Your health care provider can find your BMI and can help you achieve or maintain a healthy weight.   For adults 20 years and older: ? A BMI below 18.5 is considered underweight. ? A BMI of 18.5 to 24.9 is normal. ? A BMI of 25 to 29.9 is considered overweight. ? A BMI of 30 and above is considered obese.   . Maintain normal blood lipids and cholesterol levels by exercising and minimizing your intake of saturated fat. Eat a balanced diet with plenty of fruit and vegetables. Blood tests for lipids and cholesterol should begin at age 36 and be repeated every 5 years. If your lipid or cholesterol levels are high, you are over 50, or you are at high risk for heart disease, you may need your cholesterol levels checked more frequently. Ongoing high lipid and cholesterol levels should be treated with  medicines if diet and exercise are not working.  . If you smoke, find out from your health care provider how to quit. If you do not use tobacco, please do not start.  . If you choose to drink alcohol, please do not consume more than 2 drinks per day. One drink is considered to be 12 ounces (355 mL) of beer, 5 ounces (148 mL) of wine, or 1.5 ounces (44 mL) of liquor.  . If you are 72-48 years old, ask your health care provider if you should take aspirin to prevent strokes.  . Use sunscreen. Apply sunscreen liberally and repeatedly throughout the day. You  should seek shade when your shadow is shorter than you. Protect yourself by wearing long sleeves, pants, a wide-brimmed hat, and sunglasses year round, whenever you are outdoors.  . Once a month, do a whole body skin exam, using a mirror to look at the skin on your back. Tell your health care provider of new moles, moles that have irregular borders, moles that are larger than a pencil eraser, or moles that have changed in shape or color.

## 2017-02-14 NOTE — Progress Notes (Signed)
I reviewed health advisor's note, was available for consultation, and agree with documentation and plan.  

## 2017-02-15 ENCOUNTER — Ambulatory Visit (AMBULATORY_SURGERY_CENTER): Payer: Medicare Other | Admitting: Internal Medicine

## 2017-02-15 ENCOUNTER — Encounter: Payer: Self-pay | Admitting: Internal Medicine

## 2017-02-15 VITALS — BP 103/67 | HR 63 | Temp 97.8°F | Resp 11 | Ht 72.0 in | Wt 206.0 lb

## 2017-02-15 DIAGNOSIS — K635 Polyp of colon: Secondary | ICD-10-CM | POA: Diagnosis not present

## 2017-02-15 DIAGNOSIS — D122 Benign neoplasm of ascending colon: Secondary | ICD-10-CM

## 2017-02-15 DIAGNOSIS — D124 Benign neoplasm of descending colon: Secondary | ICD-10-CM

## 2017-02-15 DIAGNOSIS — R140 Abdominal distension (gaseous): Secondary | ICD-10-CM | POA: Diagnosis not present

## 2017-02-15 DIAGNOSIS — D125 Benign neoplasm of sigmoid colon: Secondary | ICD-10-CM

## 2017-02-15 DIAGNOSIS — Z86010 Personal history of colonic polyps: Secondary | ICD-10-CM | POA: Diagnosis not present

## 2017-02-15 MED ORDER — SODIUM CHLORIDE 0.9 % IV SOLN: 500.0000 mL | INTRAVENOUS | Status: DC

## 2017-02-15 NOTE — Progress Notes (Signed)
Spontaneous respirations throughout. VSS. Resting comfortably. To PACU on room air. Report to  Annette RN.  

## 2017-02-15 NOTE — Patient Instructions (Signed)
YOU HAD AN ENDOSCOPIC PROCEDURE TODAY AT [facility]:   Refer to the procedure report that was given to you for any specific questions about what was found during the examination.  If the procedure report does not answer your questions, please call your gastroenterologist to clarify.  If you requested that your care partner not be given the details of your procedure findings, then the procedure report has been included in a sealed envelope for you to review at your convenience later.  YOU SHOULD EXPECT: Some feelings of bloating in the abdomen. Passage of more gas than usual.  Walking can help get rid of the air that was put into your GI tract during the procedure and reduce the bloating. If you had a lower endoscopy (such as a colonoscopy or flexible sigmoidoscopy) you may notice spotting of blood in your stool or on the toilet paper. If you underwent a bowel prep for your procedure, you may not have a normal bowel movement for a few days.  Please Note:  You might notice some irritation and congestion in your nose or some drainage.  This is from the oxygen used during your procedure.  There is no need for concern and it should clear up in a day or so.  SYMPTOMS TO REPORT IMMEDIATELY:   Following lower endoscopy (colonoscopy or flexible sigmoidoscopy):  Excessive amounts of blood in the stool  Significant tenderness or worsening of abdominal pains  Swelling of the abdomen that is new, acute  Fever of 100F or higher   For urgent or emergent issues, a gastroenterologist can be reached at any hour by calling [phone].   DIET:  We do recommend a small meal at first, but then you may proceed to your regular diet.  Drink plenty of fluids but you should avoid alcoholic beverages for 24 hours.  ACTIVITY:  You should plan to take it easy for the rest of today and you should NOT DRIVE or use heavy machinery until tomorrow (because of the sedation medicines used during the test).     FOLLOW UP: Our staff will call the number listed on your records the next business day following your procedure to check on you and address any questions or concerns that you may have regarding the information given to you following your procedure. If we do not reach you, we will leave a message.  However, if you are feeling well and you are not experiencing any problems, there is no need to return our call.  We will assume that you have returned to your regular daily activities without incident.  If any biopsies were taken you will be contacted by phone or by letter within the next 1-3 weeks.  Please call us at [phone] if you have not heard about the biopsies in 3 weeks.    SIGNATURES/CONFIDENTIALITY: You and/or your care partner have signed paperwork which will be entered into your electronic medical record.  These signatures attest to the fact that that the information above on your After Visit Summary has been reviewed and is understood.  Full responsibility of the confidentiality of this discharge information lies with you and/or your care-partner.    Handouts were given to your care partner on polyps, diverticulosis, and hemorrhoids. You may resume your PLAVIX tomorrow at prior dose.  Refer to managing MD further adjustment of therapy. You may resume your othercurrent medications today. Await biopsy results. Please call if any questions or concerns.

## 2017-02-15 NOTE — Progress Notes (Signed)
No problems noted in the recovery room. maw 

## 2017-02-15 NOTE — Progress Notes (Signed)
Called to room to assist during endoscopic procedure.  Patient ID and intended procedure confirmed with present staff. Received instructions for my participation in the procedure from the performing physician.  

## 2017-02-15 NOTE — Op Note (Signed)
[place] Patient Name: Gabriel [name] Procedure Date: 02/15/2017 3:27 PM MRN: [id number] Endoscopist: Jerene Bears , MD Age: 82 Referring MD:  Date of Birth: 15-Feb-1935 Gender: Male Account #: [id number] Procedure:                Colonoscopy Indications:              Surveillance: Personal history of colonic polyps                            (unknown histology) on last colonoscopy 5 years ago Medicines:                Monitored Anesthesia Care Procedure:                Pre-Anesthesia Assessment:                           - Prior to the procedure, a History and Physical                            was performed, and patient medications and                            allergies were reviewed. The patient's tolerance of                            previous anesthesia was also reviewed. The risks                            and benefits of the procedure and the sedation                            options and risks were discussed with the patient.                            All questions were answered, and informed consent                            was obtained. Prior Anticoagulants: The patient has                            taken Plavix (clopidogrel), last dose was 5 days                            prior to procedure. ASA Grade Assessment: III - A                            patient with severe systemic disease. After                            reviewing the risks and benefits, the patient was                            deemed in satisfactory condition to undergo the  procedure.                           After obtaining informed consent, the colonoscope                            was passed under direct vision. Throughout the                            procedure, the patient's blood pressure, pulse, and                            oxygen saturations were monitored continuously. The                            Model CF-[id number]) scope was  introduced                            through the anus and advanced to the the cecum,                            identified by appendiceal orifice and ileocecal                            valve. The colonoscopy was performed without                            difficulty. The patient tolerated the procedure                            well. The quality of the bowel preparation was                            good. The ileocecal valve, appendiceal orifice, and                            rectum were photographed. Scope In: 3:36:52 PM Scope Out: 3:55:50 PM Scope Withdrawal Time: 0 hours 13 minutes 51 seconds  Total Procedure Duration: 0 hours 18 minutes 58 seconds  Findings:                 The digital rectal exam was normal.                           A 4 mm polyp was found in the ascending colon. The                            polyp was sessile. The polyp was removed with a                            cold snare. Resection and retrieval were complete.                           A 5 mm polyp was found in the descending colon. The  polyp was sessile. The polyp was removed with a                            cold snare. Resection and retrieval were complete.                           Three sessile polyps were found in the sigmoid                            colon. The polyps were 3 to 5 mm in size. These                            polyps were removed with a cold snare. Resection                            and retrieval were complete.                           Multiple small and large-mouthed diverticula were                            found in the sigmoid colon and descending colon.                           Internal hemorrhoids were found during                            retroflexion. The hemorrhoids were small. Complications:            No immediate complications. Estimated Blood Loss:     Estimated blood loss was minimal. Impression:               - One 4 mm polyp in the  ascending colon, removed                            with a cold snare. Resected and retrieved.                           - One 5 mm polyp in the descending colon, removed                            with a cold snare. Resected and retrieved.                           - Three 3 to 5 mm polyps in the sigmoid colon,                            removed with a cold snare. Resected and retrieved.                           - Moderate diverticulosis in the sigmoid colon and  in the descending colon.                           - Small internal hemorrhoids. Recommendation:           - Patient has a contact number available for                            emergencies. The signs and symptoms of potential                            delayed complications were discussed with the                            patient. Return to normal activities tomorrow.                            Written discharge instructions were provided to the                            patient.                           - Resume previous diet.                           - Continue present medications.                           - Await pathology results.                           - No repeat colonoscopy due to age.                           - Resume Plavix (clopidogrel) at prior dose                            tomorrow. Refer to managing physician for further                            adjustment of therapy. Jerene Bears, MD 02/15/2017 4:01:36 PM This report has been signed electronically.

## 2017-02-16 ENCOUNTER — Telehealth: Payer: Self-pay | Admitting: *Deleted

## 2017-02-16 NOTE — Telephone Encounter (Signed)
  Follow up Call-  Call back number 02/15/2017  Post procedure Call Back phone  # [phone]  Permission to leave phone message Yes  Some recent data might be hidden     Patient questions:  Do you have a fever, pain , or abdominal swelling? No. Pain Score  0 *  Have you tolerated food without any problems? Yes.    Have you been able to return to your normal activities? Yes.    Do you have any questions about your discharge instructions: Diet   No. Medications  No. Follow up visit  No.  Do you have questions or concerns about your Care? No.  Actions: * If pain score is 4 or above: No action needed, pain <4.

## 2017-02-20 ENCOUNTER — Encounter: Payer: Self-pay | Admitting: Internal Medicine

## 2017-02-21 ENCOUNTER — Encounter: Payer: Self-pay | Admitting: Primary Care

## 2017-02-21 ENCOUNTER — Ambulatory Visit (INDEPENDENT_AMBULATORY_CARE_PROVIDER_SITE_OTHER): Payer: Medicare Other | Admitting: Primary Care

## 2017-02-21 DIAGNOSIS — Z9989 Dependence on other enabling machines and devices: Secondary | ICD-10-CM

## 2017-02-21 DIAGNOSIS — I2583 Coronary atherosclerosis due to lipid rich plaque: Secondary | ICD-10-CM | POA: Diagnosis not present

## 2017-02-21 DIAGNOSIS — G4700 Insomnia, unspecified: Secondary | ICD-10-CM

## 2017-02-21 DIAGNOSIS — R7303 Prediabetes: Secondary | ICD-10-CM | POA: Insufficient documentation

## 2017-02-21 DIAGNOSIS — I2510 Atherosclerotic heart disease of native coronary artery without angina pectoris: Secondary | ICD-10-CM

## 2017-02-21 DIAGNOSIS — G4733 Obstructive sleep apnea (adult) (pediatric): Secondary | ICD-10-CM

## 2017-02-21 DIAGNOSIS — I359 Nonrheumatic aortic valve disorder, unspecified: Secondary | ICD-10-CM | POA: Diagnosis not present

## 2017-02-21 DIAGNOSIS — I10 Essential (primary) hypertension: Secondary | ICD-10-CM

## 2017-02-21 DIAGNOSIS — E7800 Pure hypercholesterolemia, unspecified: Secondary | ICD-10-CM | POA: Diagnosis not present

## 2017-02-21 NOTE — Assessment & Plan Note (Signed)
Aortic murmur noted on exam, stable finding. Following with cardiology.

## 2017-02-21 NOTE — Assessment & Plan Note (Signed)
Stable in the office today, continue current regimen. BMP unremarkable.  

## 2017-02-21 NOTE — Progress Notes (Signed)
Subjective:    Patient ID: Gabriel [name], male    DOB: 08-22-1934, 82 y.o.   MRN: [id number]  HPI  Gabriel [name] is an 82 year old male who presents today for [place] Part 2.  1) Essential Hypertension: Currently managed on amlodipine 5 mg, lisinopril 20 mg, metoprolol succinate 25 mg.  His blood pressure in the office today is 126/70. He denies chest pain, dizziness, weakness, shortness of breath. He is physically active during the day.  2) CAD/Hyperlipidemia: Currently managed on atorvastatin 10 mg, clopidogrel 75 mg, aspirin 81 mg, Zetia 10 mg. Wearing CPAP every night. He has noticed ear popping sensation year round secondary to CPAP and is taking Zyrtec and using Flonase with some improvement.   3) Insomnia: Currently managed on zolpidem 10 mg. Doing well on this medication.   4) Abdominal Pain: Currently following with GI and is taking Augmentin for bacterial involvement. He does have tenderness to the left upper and lower quadrants which is overall stable.   Review of Systems  Constitutional: Negative for unexpected weight change.  HENT: Negative for rhinorrhea.   Respiratory: Negative for cough and shortness of breath.   Cardiovascular: Negative for chest pain.  Gastrointestinal: Negative for constipation and diarrhea.       Intermittent abdominal tenderness, stable overall  Genitourinary: Negative for difficulty urinating.  Musculoskeletal: Negative for arthralgias and myalgias.  Skin: Negative for rash.  Allergic/Immunologic: Negative for environmental allergies.  Neurological: Negative for dizziness, numbness and headaches.  Psychiatric/Behavioral: Negative for sleep disturbance.       Past Medical History:  Diagnosis Date  . 1st degree AV block   . Aortic valve disease    a. Mild AS, AI by echo 01/2014.  Marland Kitchen CAD (coronary artery disease)    a. s/p PTCA 1995. b. MI in 2003 s/p stenting to LAD 2003, s/p stent to Cx 2003. c. prior rotablator to PTCA vessel. d. Abnl nuc 01/2014:  s/p DES to LAD, mild-mod nonobstructive disease otherwise.  . Carpal tunnel syndrome   . GERD (gastroesophageal reflux disease)   . Hyperlipidemia    a. Can only tolerate low dose statins.  . Hypertension   . Insomnia   . Ischemic cardiomyopathy    a. EF 40-45% by echo 02/04/14, 40-45% by cath 02/05/14 (similar to 2010).  . Kidney stone   . Neuropathy   . OSA (obstructive sleep apnea)    with upper airway resistancy  . RBBB   . Tubular adenoma of colon   . Vertigo    Had Epley maneuver, vestibular rehab.     Social History   Social History  . Marital status: Married    Spouse name: [name]  . Number of children: 6  . Years of education: hs   Occupational History  .  Retired   Social History Main Topics  . Smoking status: Former Research scientist (life sciences)  . Smokeless tobacco: Never Used     Comment: 1968  . Alcohol use No     Comment: rarely  . Drug use: No  . Sexual activity: Yes   Other Topics Concern  . Not on file   Social History Narrative   Patient is married [name]).   Patient has four biological children and two adopted children.   Patient is retired from Electronics engineer.   Patient has a high school education.   Patient drinks coffee (12oz) daily.   Patient is right-handed.   Enjoys traveling, working on projects.  Past Surgical History:  Procedure Laterality Date  . CARDIAC CATHETERIZATION  2003   Following MI  . CATARACT EXTRACTION, BILATERAL  2004  . CHOLECYSTECTOMY  2009  . CORONARY ANGIOPLASTY  2003   LAD  . CORONARY STENT PLACEMENT  2003   LCX  . HERNIA REPAIR  1994   double  . KIDNEY STONE SURGERY  10/2005  . KNEE SURGERY Left   . LEFT HEART CATH  02/05/2014  . LEFT HEART CATHETERIZATION WITH CORONARY ANGIOGRAM N/A 02/05/2014   Procedure: LEFT HEART CATHETERIZATION WITH CORONARY ANGIOGRAM;  Surgeon: Jettie Booze, MD;  Location: [facility] CATH LAB;  Service: Cardiovascular;  Laterality: N/A;  . LITHOTRIPSY  10/2005  .  meniscus removal- knee Right   . PERCUTANEOUS CORONARY STENT INTERVENTION (PCI-S) Right 02/05/2014   Procedure: PERCUTANEOUS CORONARY STENT INTERVENTION (PCI-S);  Surgeon: Jettie Booze, MD;  Location: [facility] CATH LAB;  Service: Cardiovascular;  Laterality: Right;  . SHOULDER OPEN ROTATOR CUFF REPAIR  1990    Family History  Problem Relation Age of Onset  . Heart Problems Mother   . Diabetes Mother   . Parkinson's disease Father 31  . Other Sister        Muscular degeneration  . Heart attack Neg Hx   . Stroke Neg Hx     Allergies  Allergen Reactions  . Lipitor [Atorvastatin Calcium] Other (See Comments)    Muscle pain--Pt stated he can tolerate a low dose  . Mevacor [Lovastatin] Other (See Comments)    "makes me feel loopy"    Current Outpatient Prescriptions on File Prior to Visit  Medication Sig Dispense Refill  . amLODipine (NORVASC) 5 MG tablet TAKE 1 TABLET BY MOUTH DAILY. 90 tablet 3  . aspirin EC 81 MG tablet Take 81 mg by mouth daily.    Marland Kitchen atorvastatin (LIPITOR) 10 MG tablet TAKE 1 TABLET BY MOUTH DAILY. 90 tablet 2  . cetirizine (ZYRTEC) 10 MG tablet Take 10 mg by mouth daily.    . clopidogrel (PLAVIX) 75 MG tablet TAKE 1 TABLET BY MOUTH DAILY. 90 tablet 3  . Coenzyme Q10 (CO Q 10 PO) Take 200 mg by mouth daily.     . cyanocobalamin 1000 MCG tablet Take 1,000 mcg by mouth daily.    Marland Kitchen ezetimibe (ZETIA) 10 MG tablet TAKE 1 TABLET BY MOUTH DAILY. 90 tablet 3  . fluticasone (FLONASE) 50 MCG/ACT nasal spray Place 1 spray into both nostrils daily.    Marland Kitchen gabapentin (NEURONTIN) 800 MG tablet TAKE 1 TABLET BY MOUTH 3 TIMES DAILY. 270 tablet 1  . lisinopril (PRINIVIL,ZESTRIL) 20 MG tablet TAKE 1 TABLET (20 MG TOTAL) BY MOUTH DAILY. 90 tablet 3  . Magnesium 500 MG TABS Take 500 mg by mouth daily.    . metoprolol succinate (TOPROL-XL) 25 MG 24 hr tablet Take 1 tablet (25 mg total) by mouth daily. 90 tablet 2  . Multiple Vitamins-Minerals (MULTIVITAMINS THER. W/MINERALS) TABS Take  1 tablet by mouth daily.    . nitroGLYCERIN (NITROSTAT) 0.4 MG SL tablet Place 1 tablet (0.4 mg total) under the tongue every 5 (five) minutes as needed for chest pain. 30 tablet 1  . Omega-3 Fatty Acids (OMEGA 3 PO) Take 1 tablet by mouth daily.    . Probiotic Product (PROBIOTIC PO) Take 10 mg by mouth daily.    . sildenafil (REVATIO) 20 MG tablet Take 2 tablets (40 mg total) by mouth daily as needed (erectile dysfunction). 50 tablet 1  . Wheat Dextrin (BENEFIBER PO)  Take 5 g by mouth daily.    Marland Kitchen zolpidem (AMBIEN) 10 MG tablet Take 1 tablet (10 mg total) by mouth at bedtime as needed. 90 tablet 0   Current Facility-Administered Medications on File Prior to Visit  Medication Dose Route Frequency Provider Last Rate Last Dose  . 0.9 %  sodium chloride infusion  500 mL Intravenous Continuous Pyrtle, Lajuan Lines, MD        BP 126/70   Pulse 76   Temp 98 F (36.7 C) (Oral)   Ht 6' (1.829 m)   Wt 205 lb 12.8 oz (93.4 kg)   SpO2 95%   BMI 27.91 kg/m    Objective:   Physical Exam  Constitutional: He is oriented to person, place, and time. He appears well-nourished.  HENT:  Right Ear: Tympanic membrane and ear canal normal.  Left Ear: Tympanic membrane and ear canal normal.  Nose: Nose normal. Right sinus exhibits no maxillary sinus tenderness and no frontal sinus tenderness. Left sinus exhibits no maxillary sinus tenderness and no frontal sinus tenderness.  Mouth/Throat: Oropharynx is clear and moist.  Bilateral canals with cerumen impaction. TM's and canals post irrigation unremarkable.  Eyes: Pupils are equal, round, and reactive to light. Conjunctivae and EOM are normal.  Neck: Neck supple. Carotid bruit is not present. No thyromegaly present.  Cardiovascular: Normal rate, regular rhythm and normal heart sounds.   Pulmonary/Chest: Effort normal and breath sounds normal. He has no wheezes. He has no rales.  Abdominal: Soft. Bowel sounds are normal. There is tenderness in the left upper  quadrant and left lower quadrant.  Mild tenderness as noted, this is not a new problem. Mid abdominal hernia noted. Non tender  Musculoskeletal: Normal range of motion.  Neurological: He is alert and oriented to person, place, and time. He has normal reflexes. No cranial nerve deficit.  Skin: Skin is warm and dry.  Psychiatric: He has a normal mood and affect.          Assessment & Plan:

## 2017-02-21 NOTE — Assessment & Plan Note (Signed)
A1C of 6.0, stable overall. Discussed changes in diet and importance of regular activity/exercise. Will continue to monitor.

## 2017-02-21 NOTE — Assessment & Plan Note (Signed)
Compliant to CPAP. 

## 2017-02-21 NOTE — Assessment & Plan Note (Signed)
Discussed regular activity/exercise and healthy diet. Continue aspirin, clopidogrel, statin.

## 2017-02-21 NOTE — Assessment & Plan Note (Signed)
Doing well on Ambien, discussed potential side effects.

## 2017-02-21 NOTE — Patient Instructions (Signed)
It's important to improve your diet by reducing consumption of fast food, fried food, processed snack foods, sugary drinks. Increase consumption of fresh vegetables and fruits, whole grains, water.  Ensure you are drinking 64 ounces of water daily.  Start exercising. You should be getting 150 minutes of moderate intensity exercise weekly.  Try Debrox ear wax removal drops in the future to help with buildup.  Ear Pressure: Try using Flonase (fluticasone) nasal spray. Instill 1 spray in each nostril twice daily.   Continue Zyrtec.  Schedule a lab only appointment 6 months to recheck your A1C (sugar level).  Follow up in 1 year for your annual exam or sooner if needed.  It was a pleasure to see you today!

## 2017-02-21 NOTE — Assessment & Plan Note (Signed)
Recent lipid panel stable. Continue statin and Zetia.

## 2017-03-07 ENCOUNTER — Other Ambulatory Visit: Payer: Self-pay | Admitting: Primary Care

## 2017-03-07 DIAGNOSIS — G4700 Insomnia, unspecified: Secondary | ICD-10-CM

## 2017-03-07 NOTE — Telephone Encounter (Signed)
Ok to refill? Electronically refill request for zolpidem (AMBIEN) 10 MG tablet  Last prescribed on 12/09/2016. Last seen on 02/21/2017

## 2017-03-08 ENCOUNTER — Encounter: Payer: Self-pay | Admitting: *Deleted

## 2017-03-08 DIAGNOSIS — Z85828 Personal history of other malignant neoplasm of skin: Secondary | ICD-10-CM | POA: Diagnosis not present

## 2017-03-08 DIAGNOSIS — L853 Xerosis cutis: Secondary | ICD-10-CM | POA: Diagnosis not present

## 2017-03-08 DIAGNOSIS — L821 Other seborrheic keratosis: Secondary | ICD-10-CM | POA: Diagnosis not present

## 2017-03-08 DIAGNOSIS — L438 Other lichen planus: Secondary | ICD-10-CM | POA: Diagnosis not present

## 2017-03-08 DIAGNOSIS — L570 Actinic keratosis: Secondary | ICD-10-CM | POA: Diagnosis not present

## 2017-03-08 NOTE — Telephone Encounter (Signed)
Called in medication to the pharmacy as instructed. 

## 2017-03-14 DIAGNOSIS — H52203 Unspecified astigmatism, bilateral: Secondary | ICD-10-CM | POA: Diagnosis not present

## 2017-03-14 DIAGNOSIS — Z961 Presence of intraocular lens: Secondary | ICD-10-CM | POA: Diagnosis not present

## 2017-03-14 DIAGNOSIS — H02831 Dermatochalasis of right upper eyelid: Secondary | ICD-10-CM | POA: Diagnosis not present

## 2017-03-14 DIAGNOSIS — H02834 Dermatochalasis of left upper eyelid: Secondary | ICD-10-CM | POA: Diagnosis not present

## 2017-04-10 ENCOUNTER — Ambulatory Visit (INDEPENDENT_AMBULATORY_CARE_PROVIDER_SITE_OTHER): Payer: Medicare Other | Admitting: Internal Medicine

## 2017-04-10 ENCOUNTER — Encounter: Payer: Self-pay | Admitting: Internal Medicine

## 2017-04-10 VITALS — BP 132/76 | HR 68 | Ht 72.6 in | Wt 221.1 lb

## 2017-04-10 DIAGNOSIS — R140 Abdominal distension (gaseous): Secondary | ICD-10-CM

## 2017-04-10 DIAGNOSIS — I2583 Coronary atherosclerosis due to lipid rich plaque: Secondary | ICD-10-CM | POA: Diagnosis not present

## 2017-04-10 DIAGNOSIS — K638219 Small intestinal bacterial overgrowth, unspecified: Secondary | ICD-10-CM

## 2017-04-10 DIAGNOSIS — I2510 Atherosclerotic heart disease of native coronary artery without angina pectoris: Secondary | ICD-10-CM | POA: Diagnosis not present

## 2017-04-10 DIAGNOSIS — Z860100 Personal history of colon polyps, unspecified: Secondary | ICD-10-CM

## 2017-04-10 DIAGNOSIS — K6389 Other specified diseases of intestine: Secondary | ICD-10-CM | POA: Diagnosis not present

## 2017-04-10 NOTE — Progress Notes (Signed)
   Subjective:    Patient ID: Gabriel [name], male    DOB: [birth year], 82 y.o.   MRN: [id number]  HPI Gabriel [name] is an 82 year old male with a history of colon polyps, SIBO with chronic abdominal bloating, CAD with history of PCI, mild aortic stenosis, hypertension and hyperlipidemia who is seen in follow-up. He was initially seen in June 2018. After this a hydrogen breath test returned positive for SIBO and he also underwent colonoscopy. Colonoscopy was performed on 02/15/2017 and revealed 5 polyps ranging in size from 3-5 mm. There was moderate diverticulosis in the descending and sigmoid colon and small internal hemorrhoids. These polyps were tubular adenoma and hyperplastic. After colonoscopy he completed therapy with Augmentin for SIBO.  Today he reports that he is feeling well. He has not seen a demonstrable change in his abdominal bloating or gas. He has had resolution of his lower abdominal crampy discomfort. He completed antibiotics without difficulty. He has continued probiotic daily. 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 He's had no rectal bleeding or blood in his stool. Good appetite. No nausea, vomiting, dysphagia or odynophagia. He has continued Benefiber with his coffee.  Review of Systems As per history of present illness, otherwise negative  Current Medications, Allergies, Past Medical History, Past Surgical History, Family History and Social History were reviewed in [employer] record.     Objective:   Physical Exam BP 132/76   Pulse 68   Ht 6' 0.6" (1.844 m)   Wt 221 lb 2 oz (100.3 kg)   BMI 29.50 kg/m  Constitutional: Well-developed and well-nourished. No distress. HEENT: Normocephalic and atraumatic.  No scleral icterus. Neurological: Alert and oriented to person place and time. Psychiatric: Normal mood and affect. Behavior is normal.  TTG - normal     Assessment & Plan:  82 year old male with a history of colon polyps, SIBO with  chronic abdominal bloating, CAD with history of PCI, mild aortic stenosis, hypertension and hyperlipidemia who is seen in follow-up.  1. SIBO/abd bloating -- Improvement in lower abdominal cramping after antibiotic treatment however persistent intermittent lower abdominal bloating and gas. He reports he deals well with the symptoms and they are not overtly troubling to him. I've asked that he try stopping the probiotic for about a month to see if this improves abdominal gas and bloating. He can resume this if symptoms worsen or he feels better on probiotic. If he develops recurrent lower abdominal discomfort or worsening gas and bloating we can consider retreatment for SIBO. He is happy with this plan  2. Personal history of colon polyps -- surveillance colonoscopy would be indicated in 3 years but will need to be discussed at that time based on his age.   Follow-up as needed 15 minutes spent with the patient today. Greater than 50% was spent in counseling and coordination of care with the patient

## 2017-04-10 NOTE — Patient Instructions (Signed)
Please discontinue your probiotic x 1 month. If your symptoms get better, stay off of this. If you they remain the same, restart the probiotic.  Follow up as needed.  If you are age 80 or older, your body mass index should be between 23-30. Your Body mass index is 29.5 kg/m. If this is out of the aforementioned range listed, please consider follow up with your Primary Care Provider.  If you are age 49 or younger, your body mass index should be between 19-25. Your Body mass index is 29.5 kg/m. If this is out of the aformentioned range listed, please consider follow up with your Primary Care Provider.

## 2017-04-12 DIAGNOSIS — H02834 Dermatochalasis of left upper eyelid: Secondary | ICD-10-CM | POA: Diagnosis not present

## 2017-04-12 DIAGNOSIS — H02831 Dermatochalasis of right upper eyelid: Secondary | ICD-10-CM | POA: Diagnosis not present

## 2017-04-14 ENCOUNTER — Encounter: Payer: Self-pay | Admitting: Cardiovascular Disease

## 2017-04-20 ENCOUNTER — Other Ambulatory Visit: Payer: Self-pay | Admitting: Cardiovascular Disease

## 2017-04-20 ENCOUNTER — Other Ambulatory Visit: Payer: Medicare Other | Admitting: *Deleted

## 2017-04-20 DIAGNOSIS — I351 Nonrheumatic aortic (valve) insufficiency: Secondary | ICD-10-CM | POA: Diagnosis not present

## 2017-04-20 DIAGNOSIS — I10 Essential (primary) hypertension: Secondary | ICD-10-CM | POA: Diagnosis not present

## 2017-04-20 DIAGNOSIS — I2510 Atherosclerotic heart disease of native coronary artery without angina pectoris: Secondary | ICD-10-CM | POA: Diagnosis not present

## 2017-04-20 LAB — LIPID PANEL
CHOL/HDL RATIO: 2.7 ratio (ref 0.0–5.0)
Cholesterol, Total: 140 mg/dL (ref 100–199)
HDL: 52 mg/dL (ref >39)
LDL Calculated: 66 mg/dL (ref 0–99)
TRIGLYCERIDES: 110 mg/dL (ref 0–149)
VLDL Cholesterol Cal: 22 mg/dL (ref 5–40)

## 2017-04-20 LAB — COMPREHENSIVE METABOLIC PANEL
A/G RATIO: 1.6 (ref 1.2–2.2)
ALT: 20 IU/L (ref 0–44)
AST: 35 IU/L (ref 0–40)
Albumin: 4.1 g/dL (ref 3.5–4.7)
Alkaline Phosphatase: 50 IU/L (ref 39–117)
BUN/Creatinine Ratio: 16 (ref 10–24)
BUN: 22 mg/dL (ref 8–27)
Bilirubin Total: 0.7 mg/dL (ref 0.0–1.2)
CALCIUM: 8.7 mg/dL (ref 8.6–10.2)
CO2: 23 mmol/L (ref 20–29)
CREATININE: 1.36 mg/dL — AB (ref 0.76–1.27)
Chloride: 102 mmol/L (ref 96–106)
GFR, EST AFRICAN AMERICAN: 56 mL/min/{1.73_m2} — AB (ref >59)
GFR, EST NON AFRICAN AMERICAN: 49 mL/min/{1.73_m2} — AB (ref >59)
GLUCOSE: 103 mg/dL — AB (ref 65–99)
Globulin, Total: 2.5 g/dL (ref 1.5–4.5)
Potassium: 4.3 mmol/L (ref 3.5–5.2)
Sodium: 138 mmol/L (ref 134–144)
TOTAL PROTEIN: 6.6 g/dL (ref 6.0–8.5)

## 2017-04-21 ENCOUNTER — Telehealth: Payer: Self-pay | Admitting: *Deleted

## 2017-04-21 NOTE — Telephone Encounter (Signed)
Patient informed. 

## 2017-04-21 NOTE — Telephone Encounter (Signed)
-----   Message from Thayer Headings, MD sent at 04/21/2017  5:46 AM EDT ----- Labs are stable

## 2017-04-27 ENCOUNTER — Ambulatory Visit (INDEPENDENT_AMBULATORY_CARE_PROVIDER_SITE_OTHER): Payer: Medicare Other | Admitting: Cardiovascular Disease

## 2017-04-27 ENCOUNTER — Encounter: Payer: Self-pay | Admitting: Cardiovascular Disease

## 2017-04-27 VITALS — BP 132/82 | HR 81 | Ht 72.6 in | Wt 209.5 lb

## 2017-04-27 DIAGNOSIS — E782 Mixed hyperlipidemia: Secondary | ICD-10-CM | POA: Diagnosis not present

## 2017-04-27 DIAGNOSIS — I2583 Coronary atherosclerosis due to lipid rich plaque: Secondary | ICD-10-CM

## 2017-04-27 DIAGNOSIS — I2510 Atherosclerotic heart disease of native coronary artery without angina pectoris: Secondary | ICD-10-CM

## 2017-04-27 NOTE — Progress Notes (Signed)
Cardiology Office Note   Date:  04/27/2017   ID:  Gabriel [name], DOB Mar 27, 1935, MRN [id number]  PCP:  Pleas Koch, NP  Cardiologist:   Mertie Moores, MD , previous [place] patient   Chief Complaint  Patient presents with  . Coronary Artery Disease   Problem list 1. Coronary artery disease 2. Mild systolic congestive heart failure due to coronary artery disease 3. Essential hypertension 4. Hyperlipidemia 5. Obstructive sleep apnea 6. Abdominal bloating    History of Present Illness: Gabriel [name] is a 82 y.o. male who presents for his CAD  I preformed several PCIs in the 1995s - 2000  Had a recent PCI in July 2015.   ( mid LAD)   Not getting much regular exercise .    Cath in July 2015:  IMPRESSIONS:  1. Normal left main coronary artery. 2. 90% focal stenosis in the mid left anterior descending artery. This was successfully treated with a 2.25 x 12 promus drug-eluting stent, postdilated to 2.6 mm in diameter. Previously placed stent in the more proximal portion of the LAD is widely patent. 3. Patent stents in the mid left circumflex artery. Mild to moderate disease in the circumflex system. 4. Mild to moderate disease right coronary artery system. The distal RCA stent has mild in-stent restenosis. 5. Mildly decreased left ventricular systolic function. LVEDP 19 mmHg. Ejection fraction 40-45%.  Sept. 6, 2017:  Doing well.   Had left knee arthroscopy recently.  No complications. No CP , no dyspnea BP is well controlled   October 17, 2016:  Doing well His knee is feeling better  Recently went to [place] to spring training   Sept. 27, 2018:  Doing well Hx of stenting  No CP , no dyspnea.   Stays busy,  remodling the activity building at church.  Not as much aerobic exercise Needs a knee replacement.   Able to do some walking  Encouraged him to use an exercise   Past Medical History:  Diagnosis Date  . 1st degree AV block   . Aortic valve  disease    a. Mild AS, AI by echo 01/2014.  Marland Kitchen CAD (coronary artery disease)    a. s/p PTCA 1995. b. MI in 2003 s/p stenting to LAD 2003, s/p stent to Cx 2003. c. prior rotablator to PTCA vessel. d. Abnl nuc 01/2014: s/p DES to LAD, mild-mod nonobstructive disease otherwise.  . Carpal tunnel syndrome   . GERD (gastroesophageal reflux disease)   . Hyperlipidemia    a. Can only tolerate low dose statins.  . Hypertension   . Insomnia   . Ischemic cardiomyopathy    a. EF 40-45% by echo 02/04/14, 40-45% by cath 02/05/14 (similar to 2010).  . Kidney stone   . Neuropathy   . OSA (obstructive sleep apnea)    with upper airway resistancy  . RBBB   . Small intestinal bacterial overgrowth   . Tubular adenoma of colon   . Vertigo    Had Epley maneuver, vestibular rehab.    Past Surgical History:  Procedure Laterality Date  . CARDIAC CATHETERIZATION  2003   Following MI  . CATARACT EXTRACTION, BILATERAL  2004  . CHOLECYSTECTOMY  2009  . CORONARY ANGIOPLASTY  2003   LAD  . CORONARY STENT PLACEMENT  2003   LCX  . HERNIA REPAIR  1994   double  . KIDNEY STONE SURGERY  10/2005  . KNEE SURGERY Left   . LEFT HEART CATH  02/05/2014  .  LEFT HEART CATHETERIZATION WITH CORONARY ANGIOGRAM N/A 02/05/2014   Procedure: LEFT HEART CATHETERIZATION WITH CORONARY ANGIOGRAM;  Surgeon: Jettie Booze, MD;  Location: [facility] CATH LAB;  Service: Cardiovascular;  Laterality: N/A;  . LITHOTRIPSY  10/2005  . meniscus removal- knee Right   . PERCUTANEOUS CORONARY STENT INTERVENTION (PCI-S) Right 02/05/2014   Procedure: PERCUTANEOUS CORONARY STENT INTERVENTION (PCI-S);  Surgeon: Jettie Booze, MD;  Location: [facility] CATH LAB;  Service: Cardiovascular;  Laterality: Right;  . SHOULDER OPEN ROTATOR CUFF REPAIR  1990     Current Outpatient Prescriptions  Medication Sig Dispense Refill  . amLODipine (NORVASC) 5 MG tablet TAKE 1 TABLET BY MOUTH DAILY. 90 tablet 3  . aspirin EC 81 MG tablet Take 81 mg by mouth daily.    Marland Kitchen  atorvastatin (LIPITOR) 10 MG tablet TAKE 1 TABLET BY MOUTH DAILY. 90 tablet 2  . cetirizine (ZYRTEC) 10 MG tablet Take 10 mg by mouth daily.    . clopidogrel (PLAVIX) 75 MG tablet TAKE 1 TABLET BY MOUTH DAILY. 90 tablet 3  . Coenzyme Q10 (CO Q 10 PO) Take 200 mg by mouth daily.     . cyanocobalamin 1000 MCG tablet Take 1,000 mcg by mouth daily.    Marland Kitchen ezetimibe (ZETIA) 10 MG tablet TAKE 1 TABLET BY MOUTH DAILY. 90 tablet 3  . fluticasone (FLONASE) 50 MCG/ACT nasal spray Place 1 spray into both nostrils daily.    Marland Kitchen gabapentin (NEURONTIN) 800 MG tablet TAKE 1 TABLET BY MOUTH 3 TIMES DAILY. 270 tablet 1  . lisinopril (PRINIVIL,ZESTRIL) 20 MG tablet TAKE 1 TABLET (20 MG TOTAL) BY MOUTH DAILY. 90 tablet 3  . Magnesium 500 MG TABS Take 500 mg by mouth daily.    . metoprolol succinate (TOPROL-XL) 25 MG 24 hr tablet TAKE 1 TABLET BY MOUTH DAILY. 90 tablet 1  . Multiple Vitamins-Minerals (MULTIVITAMINS THER. W/MINERALS) TABS Take 1 tablet by mouth daily.    . nitroGLYCERIN (NITROSTAT) 0.4 MG SL tablet Place 1 tablet (0.4 mg total) under the tongue every 5 (five) minutes as needed for chest pain. 30 tablet 1  . Omega-3 Fatty Acids (OMEGA 3 PO) Take 1 tablet by mouth daily.    . sildenafil (REVATIO) 20 MG tablet Take 2 tablets (40 mg total) by mouth daily as needed (erectile dysfunction). 50 tablet 1  . Wheat Dextrin (BENEFIBER PO) Take 5 g by mouth daily.    Marland Kitchen zolpidem (AMBIEN) 10 MG tablet TAKE 1 TABLET BY MOUTH AT BEDTIME AS NEEDED 90 tablet 0   Current Facility-Administered Medications  Medication Dose Route Frequency Provider Last Rate Last Dose  . 0.9 %  sodium chloride infusion  500 mL Intravenous Continuous Pyrtle, Lajuan Lines, MD        Allergies:   Lipitor [atorvastatin calcium] and Mevacor [lovastatin]    Social History:  The patient  reports that he has quit smoking. He has never used smokeless tobacco. He reports that he does not drink alcohol or use drugs.   Family History:  The patient's  family history includes Diabetes in his mother; Heart Problems in his mother; Other in his sister; Parkinson's disease (age of onset: 13) in his father.    ROS:  Please see the history of present illness.    Review of Systems: Constitutional:  denies fever, chills, diaphoresis, appetite change and fatigue.  HEENT: denies photophobia, eye pain, redness, hearing loss, ear pain, congestion, sore throat, rhinorrhea, sneezing, neck pain, neck stiffness and tinnitus.  Respiratory: denies SOB, DOE, cough,  chest tightness, and wheezing.  Cardiovascular: denies chest pain, palpitations and leg swelling.  Gastrointestinal: denies nausea, vomiting, abdominal pain, diarrhea, constipation, blood in stool.  Genitourinary: denies dysuria, urgency, frequency, hematuria, flank pain and difficulty urinating.  Musculoskeletal: denies  myalgias, back pain, joint swelling, arthralgias and gait problem.   Skin: denies pallor, rash and wound.  Neurological: denies dizziness, seizures, syncope, weakness, light-headedness, numbness and headaches.   Hematological: denies adenopathy, easy bruising, personal or family bleeding history.  Psychiatric/ Behavioral: denies suicidal ideation, mood changes, confusion, nervousness, sleep disturbance and agitation.       All other systems are reviewed and negative.   Physical Exam: Blood pressure 132/82, pulse 81, height 6' 0.6" (1.844 m), weight 209 lb 8 oz (95 kg), SpO2 95 %.  GEN:  Well nourished, well developed in no acute distress HEENT: Normal NECK: No JVD; No carotid bruits LYMPHATICS: No lymphadenopathy CARDIAC: RRR , 2/6 systolic murmur, soft diastolic murmur , rubs, gallops RESPIRATORY:  Clear to auscultation without rales, wheezing or rhonchi  ABDOMEN: Soft, non-tender, non-distended MUSCULOSKELETAL:  No edema; No deformity  SKIN: Warm and dry NEUROLOGIC:  Alert and oriented x 3    EKG:  EKG is ordered today. The ekg ordered today demonstrates NSR at  68.  RBBB , no changes from previous tracing    Recent Labs: 04/20/2017: ALT 20; BUN 22; Creatinine, Ser 1.36; Potassium 4.3; Sodium 138    Lipid Panel    Component Value Date/Time   CHOL 140 04/20/2017 0814   TRIG 110 04/20/2017 0814   HDL 52 04/20/2017 0814   CHOLHDL 2.7 04/20/2017 0814   CHOLHDL 2.3 04/06/2016 0920   VLDL 25 04/06/2016 0920   LDLCALC 66 04/20/2017 0814   LDLDIRECT 145.5 09/16/2013 0811      Wt Readings from Last 3 Encounters:  04/27/17 209 lb 8 oz (95 kg)  04/10/17 221 lb 2 oz (100.3 kg)  02/21/17 205 lb 12.8 oz (93.4 kg)      Other studies Reviewed: Additional studies/ records that were reviewed today include: . Review of the above records demonstrates:    ASSESSMENT AND PLAN:  1. Coronary artery disease -  Doing well, no angina  Right needs to have some eyelid surgery. It will be okay for him to stop Plavix for 5 days and aspirin for 7 days prior to his surgery. Most recent lipid levels look great.  2. Mild systolic congestive heart failure :  Stable, no dyspnea .  3. Essential hypertension - BP looks good   4. Hyperlipidemia-  Recent lipids look great   5. Aortic stenosis/ aortic insufficiency    Stable   5. Obstructive sleep apnea  6. Abdominal bloating    Current medicines are reviewed at length with the patient today.  The patient does not have concerns regarding medicines.  The following changes have been made:  no change  Labs/ tests ordered today include:   No orders of the defined types were placed in this encounter.    Disposition:   FU with me in 6 months      Mertie Moores, MD  04/27/2017 9:46 AM    [facility] [address], [place], [place]  [address] Phone: [phone]; Fax: [phone]

## 2017-04-27 NOTE — Patient Instructions (Signed)

## 2017-05-13 IMAGING — MR MR KNEE*L* W/O CM
4 of 6 series · 18 of 40 positions shown · non-contrast
Comparison: None.

CLINICAL DATA: Lateral knee pain radiating down the lower leg.

EXAM:
MRI OF THE LEFT KNEE WITHOUT CONTRAST
TECHNIQUE: Multiplanar, multisequence MR imaging of the knee was performed. No
intravenous contrast was administered.

[Series 3: PD fat-sat · axial · 4.0mm · 0.31mm/px · z∈[-36,+79]mm · 8 of 25 slices shown (1 of 4)]
[im 1/25]
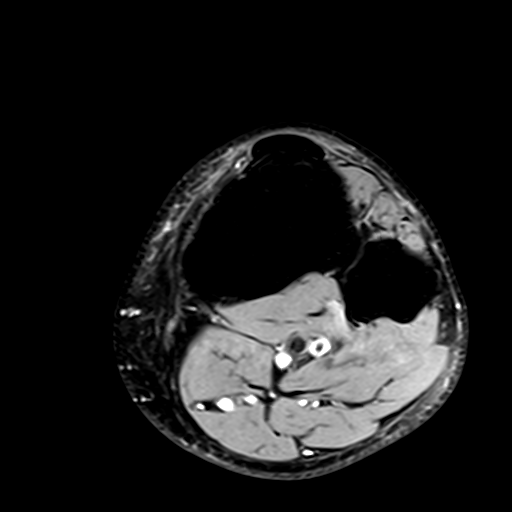
[im 4/25]
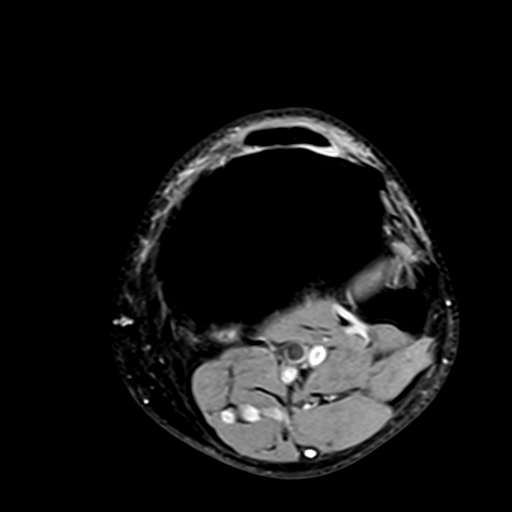
[im 7/25]
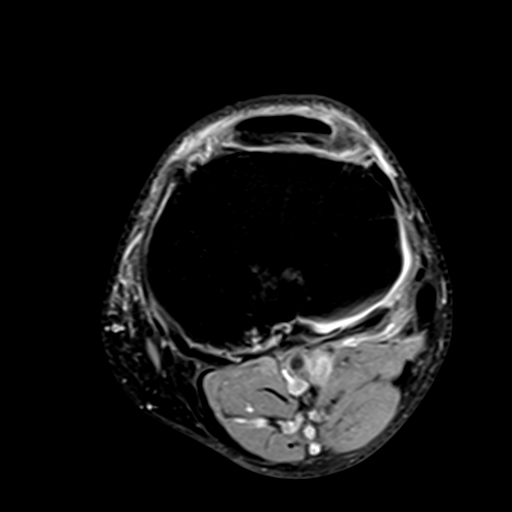
[im 11/25]
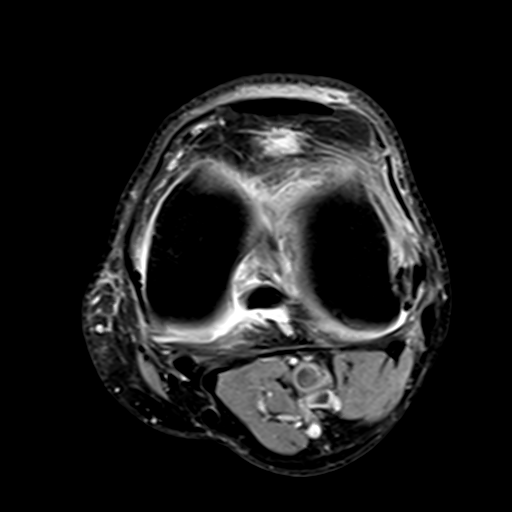
[im 14/25]
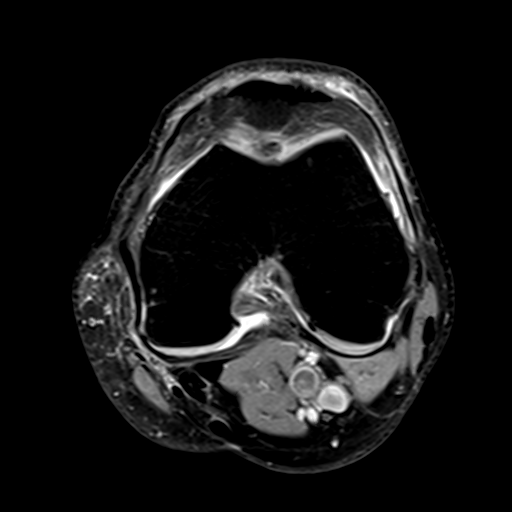
[im 18/25]
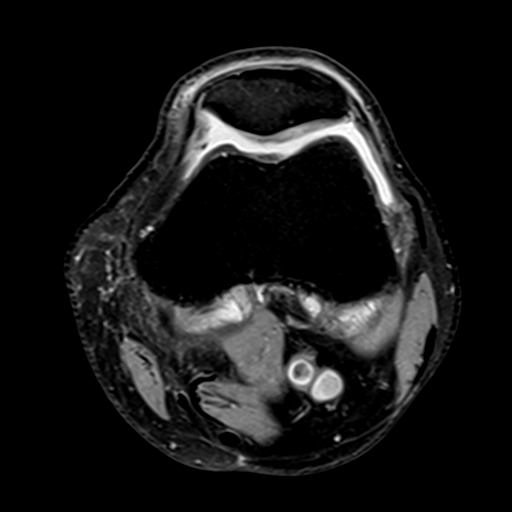
[im 21/25]
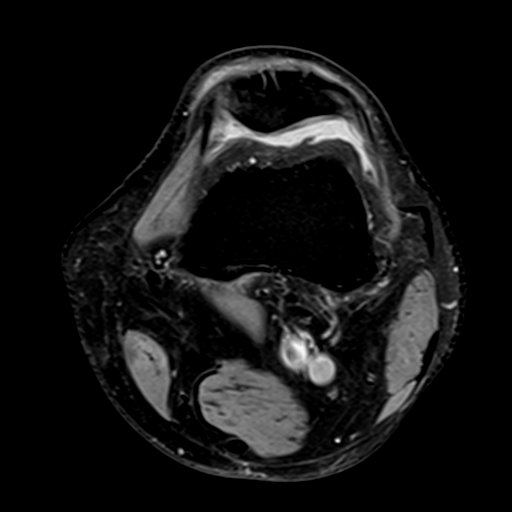
[im 25/25]
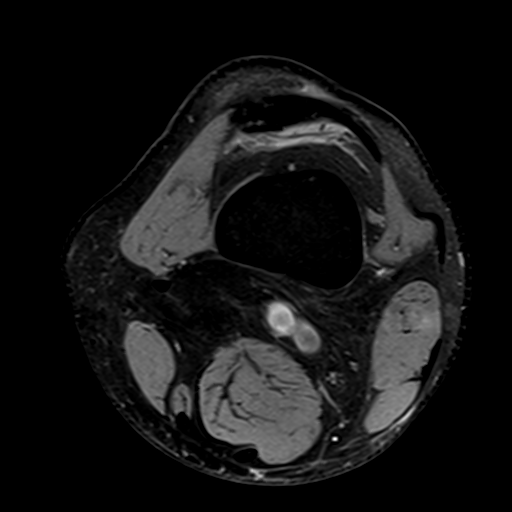

[Series 4: PD fat-sat · coronal · 3.5mm · 0.29mm/px · 4 of 25 slices shown (2 of 4)]
[im 1/25]
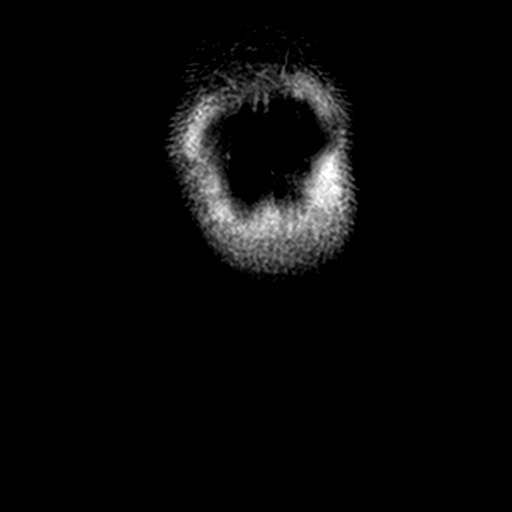
[im 5/25]
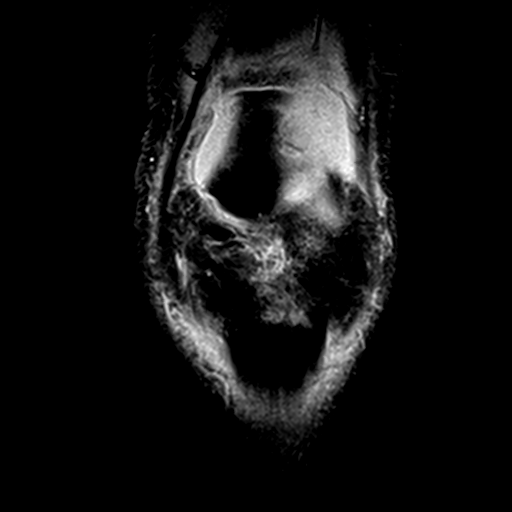
[im 13/25]
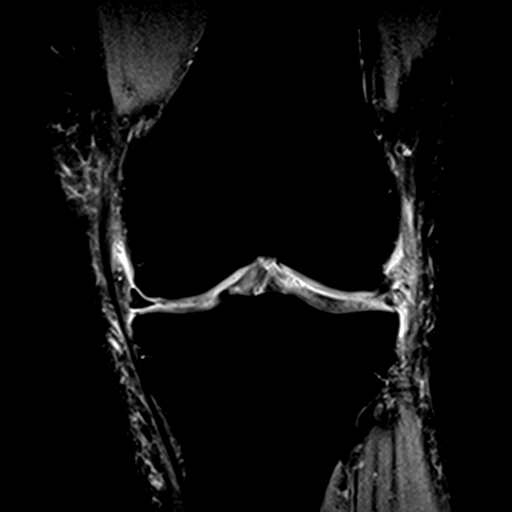
[im 21/25]
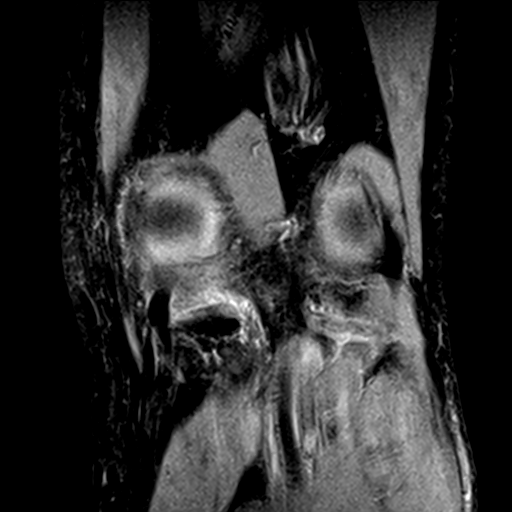

[Series 6: PD fat-sat · sagittal · 3.2mm · 0.29mm/px · 3 of 25 slices shown (3 of 4)]
[im 5/25]
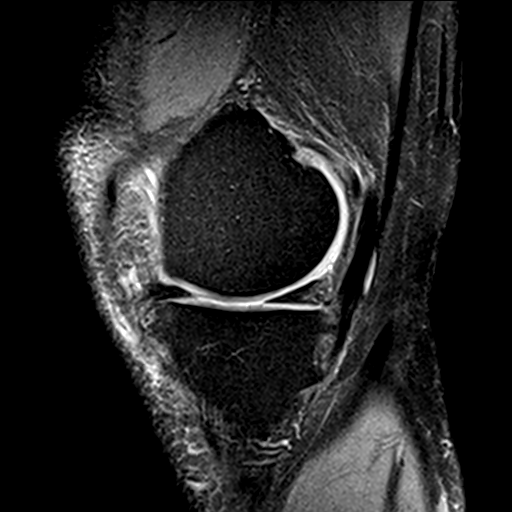
[im 13/25]
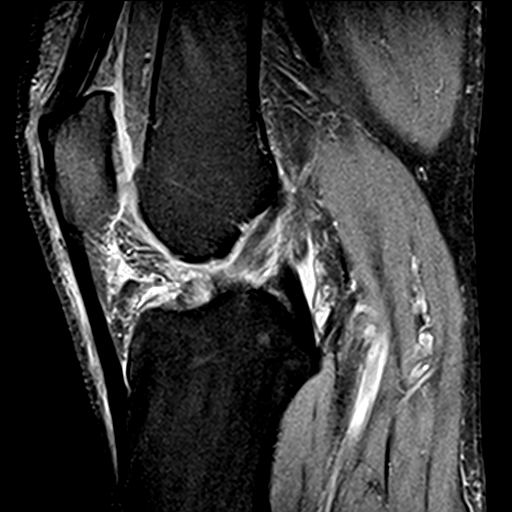
[im 21/25]
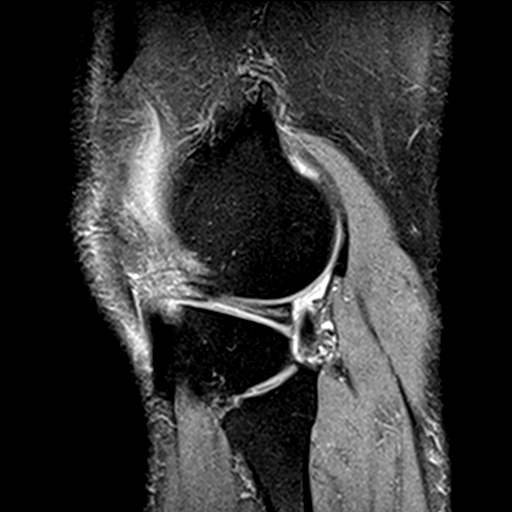

[Series 8: PD fat-sat · oblique · 2.0mm · 0.29mm/px · 3 of 15 slices shown (4 of 4)]
[im 1/15]
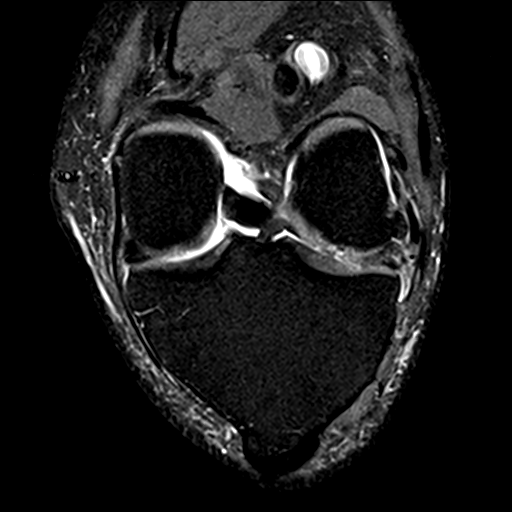
[im 10/15]
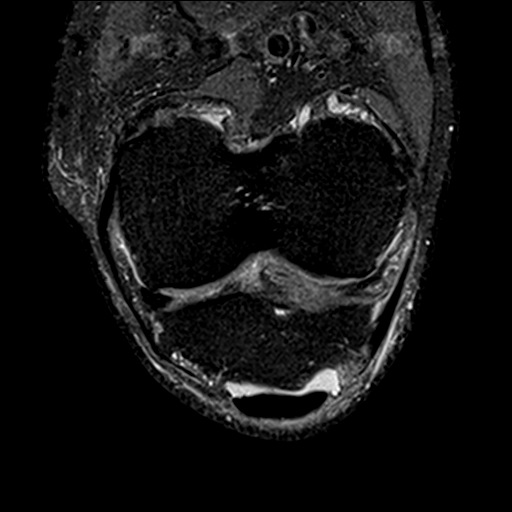
[im 15/15]
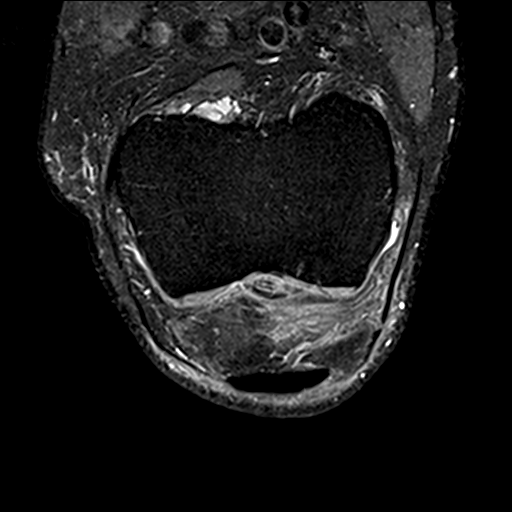

[18 of 40 positions shown; findings below may reference images not displayed]

FINDINGS: MENISCI

Medial meniscus:  Intact.

Lateral meniscus: Severe increased signal within the posterior horn
of the lateral meniscus consistent with severe degeneration with
fraying along the under surface. Radial tear of the free edge of the
body of the lateral meniscus. Oblique tear of the anterior horn of
the lateral meniscus extending to the inferior articular surface.

LIGAMENTS

Cruciates:  Intact ACL and PCL.

Collaterals: Medial collateral ligament is intact. Lateral
collateral ligament complex is intact.

CARTILAGE

Patellofemoral: Full-thickness cartilage loss of the medial patellar
facet and patellar apex. Mild partial-thickness cartilage loss of
the lateral patellar facet. Partial-thickness cartilage loss of the
trochlea.

Medial: Partial-thickness cartilage loss the medial femoral condyle
and medial tibial plateau.

Lateral: Partial-thickness cartilage loss of the lateral femoral
condyle and lateral tibial plateau. Small area of high-grade
partial-thickness cartilage loss of the lateral femoral condyle with
a small area of delamination measuring 4.9 mm.

Joint: Small joint effusion. Edema in Hoffa's fat. No plical
thickening.

Popliteal Fossa:  No Baker cyst.  Intact popliteus tendon.

Extensor Mechanism: Intact quadriceps tendon. Intact patellar
tendon. Small amount of fluid in the deep infrapatellar bursa.

Bones: No other focal marrow signal abnormality. No acute fracture
or dislocation. No aggressive osseous lesion.

Other: None
IMPRESSION: 1. Tricompartmental cartilage abnormalities as described above.
2. Severe increased signal within the posterior horn of the lateral
meniscus consistent with severe degeneration with fraying along the
under surface. Radial tear of the free edge of the body of the
lateral meniscus. Oblique tear of the anterior horn of the lateral
meniscus extending to the inferior articular surface.
3. Small joint effusion.

## 2017-05-30 HISTORY — PX: BELPHAROPTOSIS REPAIR: SHX369

## 2017-06-01 DIAGNOSIS — H53453 Other localized visual field defect, bilateral: Secondary | ICD-10-CM | POA: Diagnosis not present

## 2017-06-01 DIAGNOSIS — H02834 Dermatochalasis of left upper eyelid: Secondary | ICD-10-CM | POA: Diagnosis not present

## 2017-06-01 DIAGNOSIS — H02831 Dermatochalasis of right upper eyelid: Secondary | ICD-10-CM | POA: Diagnosis not present

## 2017-06-05 ENCOUNTER — Other Ambulatory Visit: Payer: Self-pay | Admitting: Primary Care

## 2017-06-05 DIAGNOSIS — G4700 Insomnia, unspecified: Secondary | ICD-10-CM

## 2017-06-05 NOTE — Telephone Encounter (Signed)
Ok to refill? Electronically refill request for zolpidem (AMBIEN) 10 MG tablet  Last prescribed on 03/07/2017. Last seen on 02/21/2017

## 2017-06-05 NOTE — Telephone Encounter (Signed)
Ok to phone in Ambien 

## 2017-06-05 NOTE — Telephone Encounter (Signed)
Called in medication to the pharmacy as instructed. 

## 2017-07-19 ENCOUNTER — Other Ambulatory Visit: Payer: Self-pay | Admitting: Cardiovascular Disease

## 2017-08-01 DIAGNOSIS — J189 Pneumonia, unspecified organism: Secondary | ICD-10-CM

## 2017-08-01 HISTORY — DX: Pneumonia, unspecified organism: J18.9

## 2017-08-02 ENCOUNTER — Other Ambulatory Visit: Payer: Self-pay | Admitting: Neurology

## 2017-08-02 ENCOUNTER — Other Ambulatory Visit: Payer: Self-pay | Admitting: Cardiovascular Disease

## 2017-08-14 ENCOUNTER — Other Ambulatory Visit: Payer: Self-pay | Admitting: Primary Care

## 2017-08-14 DIAGNOSIS — R7303 Prediabetes: Secondary | ICD-10-CM

## 2017-08-24 ENCOUNTER — Other Ambulatory Visit (INDEPENDENT_AMBULATORY_CARE_PROVIDER_SITE_OTHER): Payer: Medicare Other

## 2017-08-24 DIAGNOSIS — R7303 Prediabetes: Secondary | ICD-10-CM

## 2017-08-24 DIAGNOSIS — E782 Mixed hyperlipidemia: Secondary | ICD-10-CM

## 2017-08-24 DIAGNOSIS — I2510 Atherosclerotic heart disease of native coronary artery without angina pectoris: Secondary | ICD-10-CM

## 2017-08-24 LAB — HEMOGLOBIN A1C: HEMOGLOBIN A1C: 6.2 % (ref 4.6–6.5)

## 2017-08-24 NOTE — Addendum Note (Signed)
Addended by: Ellamae Sia on: 08/24/2017 11:11 AM   Modules accepted: Orders

## 2017-08-29 ENCOUNTER — Telehealth: Payer: Self-pay | Admitting: *Deleted

## 2017-08-29 NOTE — Telephone Encounter (Signed)
    [facility] Pre-operative Risk Assessment    Request for surgical clearance:  1. What type of surgery is being performed? EXTRACTION OF UPPER RIGHT WISDOM TOOTH   2. When is this surgery scheduled? PENDING CLEARANCE   3. What type of clearance is required (medical clearance vs. Pharmacy clearance to hold med vs. Both)? PHARMACY CLEARANCE  4. Are there any medications that need to be held prior to surgery and how long? CAN PT STOP HIS PLAVIX PRE-OPERATIVELY, 3-5 DAYS?   5. Practice name and name of physician performing surgery? [facility]   6. What is your office phone and fax number? [phone] AND FAX--[phone]   7. Anesthesia type (None, local, MAC, general) ? LOCAL ANESTHETIC    Nuala Alpha 08/29/2017, 3:29 PM  _________________________________________________________________   (provider comments below)

## 2017-08-29 NOTE — Telephone Encounter (Signed)
  I will route this recommendation to the requesting party via Epic fax function and remove from pre-op pool.  Please call with questions.  [place], [place] 08/29/2017, 3:57 PM

## 2017-08-29 NOTE — Telephone Encounter (Signed)
   Primary Cardiologist:No primary care provider on file.  Chart reviewed as part of pre-operative protocol coverage. Dr. Acie Fredrickson is it ok to hold Plavix?    [place], [place]  08/29/2017, 3:44 PM

## 2017-08-29 NOTE — Telephone Encounter (Signed)
Pt may hold Plavix for 5 days prior to dental extraction He is at low risk for procedure

## 2017-09-01 ENCOUNTER — Other Ambulatory Visit: Payer: Self-pay | Admitting: Internal Medicine

## 2017-09-01 DIAGNOSIS — G4700 Insomnia, unspecified: Secondary | ICD-10-CM

## 2017-09-02 NOTE — Telephone Encounter (Signed)
Last filled 06/05/2017-- please advise

## 2017-09-03 NOTE — Telephone Encounter (Signed)
Refill sent to pharmacy.   

## 2017-09-20 ENCOUNTER — Encounter: Payer: Self-pay | Admitting: Neurology

## 2017-09-20 ENCOUNTER — Ambulatory Visit (INDEPENDENT_AMBULATORY_CARE_PROVIDER_SITE_OTHER): Payer: Medicare Other | Admitting: Neurology

## 2017-09-20 VITALS — BP 130/74 | HR 75 | Ht 72.0 in | Wt 216.0 lb

## 2017-09-20 DIAGNOSIS — J322 Chronic ethmoidal sinusitis: Secondary | ICD-10-CM | POA: Insufficient documentation

## 2017-09-20 DIAGNOSIS — Z9989 Dependence on other enabling machines and devices: Secondary | ICD-10-CM

## 2017-09-20 DIAGNOSIS — G4733 Obstructive sleep apnea (adult) (pediatric): Secondary | ICD-10-CM | POA: Diagnosis not present

## 2017-09-20 DIAGNOSIS — I2510 Atherosclerotic heart disease of native coronary artery without angina pectoris: Secondary | ICD-10-CM

## 2017-09-20 NOTE — Progress Notes (Signed)
[facility]  Provider:  Larey Seat, M D  Referring Provider: Pleas Koch, NP Primary Care Physician:  Pleas Koch, NP    OSA on CPAP :    Gabriel [name] is a 83 y.o. male is seen here in a yearly revisit on 09-20-2017. Referred by NP Carlis Abbott , in cardiology.  He again has a 97% compliance , 7 hrs. And 32 minutes, at 6 cm H20 pressure, 3 cm EPR and a residual AI of 3.4 /hr. 1.6 of these obstructive, 1.5 unknown. 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    HPI: Gabriel [name] has been followed in this clinic since 2012 after he underwent a sleep study( ordered by Dr. Mare Ferrari), on 04-19-11. At that time he has a past medical history of coronary artery disease, aortic valve disease, peripheral neuropathy and hypertension.  He had mostly complained of restless legs and his wife had witnessed apneas. The patient was diagnosed with an AHI of 24.9 and an RDI of 28.5. There was a clear accentuation of apnea during REM sleep - the REM AHI being 48.9,  supine AHI was also elevated at 40 over the nonsupine AHI of of 14. Nadir of  oxygen saturation was 77% and 59 minutes of desaturations total time. The patient returned for a titration study and was titrated to 6 cm water pressure and 2 centimeter EPR. He had frequent sinusitis before CPAP use, and not since. He is not longer snoring. He may have one nocturia if any at all. Overall sleep duration is 7 hours at night , occasional naps in the afternoon.   09-18-14 CPAP download shows the patient using the machine 7 hours and 28 minutes nightly, with a residual AHI of 3.0 , a moderate air leak.  The patient uses a nasal pillow with heated humidity. He has a 100% compliance for CPAP use above 4 hours nightly user time.  Out of 90 days the machine was used 90 days, exemplary user.  His restless legs have been asymptomatic - he had PLMs , no longer RLS.   The pets states that he feels he can breathe much  better once he starts using CPAP otherwise he often has an congested nose he also has trouble with repeated sinusitis infections.He has spoken to Dr. Wilburn Cornelia who diagnosed him with a deviated septum and wanted me to discuss today with the patient as this could improve his overall nasal airflow as well as the CPAP compliance or tolerance.  I think the patient is obviously doing very well with his CPAP as is but he has discomfort and frequent  Nasal discharges he also complains of a postnasal drip. He could benefit indeed from an septal correction surgery and perhaps an adenoid trimming.  09-21-15 Patient here for yearly Rv on CPAP, compliance documentation. Gabriel [name] is seen here today and be discussed the compliance report for CPAP obtained in the office which includes last night's data. He uses the machine on average 7 hours and 28 minutes nightly has a 97% compliance for over 4 hours of daily use there was truly only one day over the last 30 days was user time was shorter than 4 hours. His residual AHI is 4.4 he does have some significant air leaks and typically there is a higher residual AHI on week ends. That may be related to intake of alcohol, if they go out. He doesn't drink wine regularly and not at home.  No caffeine  in the evening.   Interval history 09-20-2016 , Patient is 100% compliant and very happy with CPAP, he can't sleep without it.  His residual AHI was 3.0 at a setting of 6 cm water with 3 cm EPR, average user time 7 hours 49 minutes. He is using a nasal pillow, endorsed at 0 points on the geriatric depression score, 5 points on the Epworth Sleepiness Scale and fatigue severity scale was endorsed at 24 points all below average. 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      Review of Systems: 09-21-15  Out of a complete 14 system review, the patient complains of only the following symptoms, and all other reviewed systems are negative. The  patient endorsed the geriatric depression score at 0 points, the restless leg score again at mild degree of impairment. FSS at 28 and Epworth at 6 points.   Social History   Socioeconomic History  . Marital status: Married    Spouse name: [name]  . Number of children: 6  . Years of education: hs  . Highest education level: Not on file  Social Needs  . Financial resource strain: Not on file  . Food insecurity - worry: Not on file  . Food insecurity - inability: Not on file  . Transportation needs - medical: Not on file  . Transportation needs - non-medical: Not on file  Occupational History    Employer: RETIRED  Tobacco Use  . Smoking status: Former Research scientist (life sciences)  . Smokeless tobacco: Never Used  . Tobacco comment: 1968  Substance and Sexual Activity  . Alcohol use: No    Alcohol/week: 0.0 oz    Comment: rarely  . Drug use: No  . Sexual activity: Yes  Other Topics Concern  . Not on file  Social History Narrative   Patient is married [name]).   Patient has four biological children and two adopted children.   Patient is retired from Electronics engineer.   Patient has a high school education.   Patient drinks coffee (12oz) daily.   Patient is right-handed.   Enjoys traveling, working on projects.              Family History  Problem Relation Age of Onset  . Heart Problems Mother   . Diabetes Mother   . Parkinson's disease Father 51  . Other Sister        Muscular degeneration  . Heart attack Neg Hx   . Stroke Neg Hx     Past Medical History:  Diagnosis Date  . 1st degree AV block   . Aortic valve disease    a. Mild AS, AI by echo 01/2014.  Marland Kitchen CAD (coronary artery disease)    a. s/p PTCA 1995. b. MI in 2003 s/p stenting to LAD 2003, s/p stent to Cx 2003. c. prior rotablator to PTCA vessel. d. Abnl nuc 01/2014: s/p DES to LAD, mild-mod nonobstructive disease otherwise.  . Carpal tunnel syndrome   . GERD (gastroesophageal reflux disease)   . Hyperlipidemia     a. Can only tolerate low dose statins.  . Hypertension   . Insomnia   . Ischemic cardiomyopathy    a. EF 40-45% by echo 02/04/14, 40-45% by cath 02/05/14 (similar to 2010).  . Kidney stone   . Neuropathy   . OSA (obstructive sleep apnea)    with upper airway resistancy  . RBBB   . Small intestinal bacterial overgrowth   . Tubular adenoma of colon   .  Vertigo    Had Epley maneuver, vestibular rehab.    Past Surgical History:  Procedure Laterality Date  . CARDIAC CATHETERIZATION  2003   Following MI  . CATARACT EXTRACTION, BILATERAL  2004  . CHOLECYSTECTOMY  2009  . CORONARY ANGIOPLASTY  2003   LAD  . CORONARY STENT PLACEMENT  2003   LCX  . HERNIA REPAIR  1994   double  . KIDNEY STONE SURGERY  10/2005  . KNEE SURGERY Left   . LEFT HEART CATH  02/05/2014  . LEFT HEART CATHETERIZATION WITH CORONARY ANGIOGRAM N/A 02/05/2014   Procedure: LEFT HEART CATHETERIZATION WITH CORONARY ANGIOGRAM;  Surgeon: Jettie Booze, MD;  Location: [facility] CATH LAB;  Service: Cardiovascular;  Laterality: N/A;  . LITHOTRIPSY  10/2005  . meniscus removal- knee Right   . PERCUTANEOUS CORONARY STENT INTERVENTION (PCI-S) Right 02/05/2014   Procedure: PERCUTANEOUS CORONARY STENT INTERVENTION (PCI-S);  Surgeon: Jettie Booze, MD;  Location: [facility] CATH LAB;  Service: Cardiovascular;  Laterality: Right;  . SHOULDER OPEN ROTATOR CUFF REPAIR  1990    Current Outpatient Medications  Medication Sig Dispense Refill  . amLODipine (NORVASC) 5 MG tablet TAKE 1 TABLET BY MOUTH DAILY. 90 tablet 2  . aspirin EC 81 MG tablet Take 81 mg by mouth daily.    Marland Kitchen atorvastatin (LIPITOR) 10 MG tablet TAKE 1 TABLET BY MOUTH DAILY. 90 tablet 3  . cetirizine (ZYRTEC) 10 MG tablet Take 10 mg by mouth daily.    . clopidogrel (PLAVIX) 75 MG tablet TAKE 1 TABLET BY MOUTH DAILY. 90 tablet 3  . Coenzyme Q10 (CO Q 10 PO) Take 200 mg by mouth daily.     . cyanocobalamin 1000 MCG tablet Take 1,000 mcg by mouth daily.    Marland Kitchen ezetimibe  (ZETIA) 10 MG tablet TAKE 1 TABLET BY MOUTH DAILY. 90 tablet 3  . fluticasone (FLONASE) 50 MCG/ACT nasal spray Place 1 spray into both nostrils daily.    Marland Kitchen gabapentin (NEURONTIN) 800 MG tablet TAKE 1 TABLET BY MOUTH 3 TIMES DAILY. 270 tablet 0  . lisinopril (PRINIVIL,ZESTRIL) 20 MG tablet TAKE 1 TABLET (20 MG TOTAL) BY MOUTH DAILY. 90 tablet 3  . Magnesium 500 MG TABS Take 500 mg by mouth daily.    . metoprolol succinate (TOPROL-XL) 25 MG 24 hr tablet TAKE 1 TABLET BY MOUTH DAILY. 90 tablet 1  . Multiple Vitamins-Minerals (MULTIVITAMINS THER. W/MINERALS) TABS Take 1 tablet by mouth daily.    . nitroGLYCERIN (NITROSTAT) 0.4 MG SL tablet Place 1 tablet (0.4 mg total) under the tongue every 5 (five) minutes as needed for chest pain. 30 tablet 1  . Omega-3 Fatty Acids (OMEGA 3 PO) Take 1 tablet by mouth daily.    . sildenafil (REVATIO) 20 MG tablet Take 2 tablets (40 mg total) by mouth daily as needed (erectile dysfunction). 50 tablet 1  . Wheat Dextrin (BENEFIBER PO) Take 5 g by mouth daily.    Marland Kitchen zolpidem (AMBIEN) 10 MG tablet TAKE 1 TABLET BY MOUTH AT BEDTIME AS NEEDED FOR SLEEP 90 tablet 0   Current Facility-Administered Medications  Medication Dose Route Frequency Provider Last Rate Last Dose  . 0.9 %  sodium chloride infusion  500 mL Intravenous Continuous Pyrtle, Lajuan Lines, MD        Allergies as of 09/20/2017 - Review Complete 09/20/2017  Allergen Reaction Noted  . Lipitor [atorvastatin calcium] Other (See Comments) 08/30/2010  . Mevacor [lovastatin] Other (See Comments) 08/30/2010    Vitals: BP 130/74   Pulse  75   Ht 6' (1.829 m)   Wt 216 lb (98 kg)   BMI 29.29 kg/m  Last Weight:  Wt Readings from Last 1 Encounters:  09/20/17 216 lb (98 kg)   Last Height:   Ht Readings from Last 1 Encounters:  09/20/17 6' (1.829 m)     Physical exam: none today , only vitals.   General: The patient is awake, alert and appears not in acute distress. The patient is well groomed. Head:  Normocephalic, atraumatic. Neck is supple. Mallampati 3 , neck circumference:15.25  inches  Cardiovascular:  Regular rate and rhythm, without  murmurs or carotid bruit, and without distended neck veins. Respiratory: Lungs are clear to auscultation. Skin:  Without evidence of edema, or rash Trunk: BMI is elevated , the patient has normal posture, no scoliosis.  Neurologic exam : The patient is awake and alert, oriented to place and time. Memory subjective  described as intact. There is a normal attention span & concentration ability. Speech is fluent without  dysarthria, dysphonia or aphasia. Mood and affect are appropriate.   Assessment:   15 minute face to face time dedicated to coordination of care and discussion of treatment alternatives. After physical and neurologic examination, review of laboratory studies, imaging, neurophysiology testing and pre-existing records, assessment is   1) Burning dysesthesias as the day progresses , mostly in feet. Numb fingertips has affected his ability to turn pages-   Neuropathy. Small fiber - not seen on NCV . He is not diabetic  No associated dysautonomia? He denies constipation, has noted orthostatic lightheadedness and he no longer sweats , even in summertime.   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 He was checked in NCV by Dr. Carmelia Bake office, has bilateral carpal tunnel syndrome.  2) OSA - treated well on CPAP, 6 cm water with 3 cm EPR, compliance is high at 97% , year after year, RV in 12 month. Epworth 6.   3) He has overcome Myalgia , attributed to statin use. He tolerates Lipitor 10 mg, co enzyme Q 10 .   Plan:  RV in 12 month - download of CPAP. At this time no medical treatment of neuropathy.    Larey Seat, MD

## 2017-10-15 ENCOUNTER — Ambulatory Visit (HOSPITAL_COMMUNITY)
Admission: EM | Admit: 2017-10-15 | Discharge: 2017-10-15 | Disposition: A | Discharge status: Home / Self Care | Payer: Medicare Other | Attending: Physician Assistant | Admitting: Physician Assistant

## 2017-10-15 ENCOUNTER — Encounter (HOSPITAL_COMMUNITY): Payer: Self-pay | Admitting: Emergency Medicine

## 2017-10-15 ENCOUNTER — Telehealth: Payer: Medicare Other | Admitting: Family

## 2017-10-15 DIAGNOSIS — B9689 Other specified bacterial agents as the cause of diseases classified elsewhere: Secondary | ICD-10-CM

## 2017-10-15 DIAGNOSIS — R079 Chest pain, unspecified: Secondary | ICD-10-CM

## 2017-10-15 DIAGNOSIS — R05 Cough: Secondary | ICD-10-CM

## 2017-10-15 DIAGNOSIS — R059 Cough, unspecified: Secondary | ICD-10-CM

## 2017-10-15 DIAGNOSIS — J019 Acute sinusitis, unspecified: Secondary | ICD-10-CM

## 2017-10-15 MED ORDER — BENZONATATE 100 MG PO CAPS: 100.0000 mg | ORAL_CAPSULE | Freq: Three times a day (TID) | ORAL | 0 refills | Status: DC

## 2017-10-15 MED ORDER — DOXYCYCLINE HYCLATE 100 MG PO CAPS: 100.0000 mg | ORAL_CAPSULE | Freq: Two times a day (BID) | ORAL | 0 refills | Status: DC

## 2017-10-15 NOTE — Progress Notes (Signed)
Based on what you shared with me it looks like you have a serious condition that should be evaluated in a face to face office visit.  NOTE: If you entered your credit card information for this eVisit, you will not be charged. You may see a "hold" on your card for the $30 but that hold will drop off and you will not have a charge processed.  If you are having a true medical emergency please call 911.  If you need an urgent face to face visit, [facility] has four urgent care centers for your convenience.  If you need care fast and have a high deductible or no insurance consider:   [email or web address] to reserve your spot online an avoid wait times  [facility] [address], [address] [place], [place] [address] 8 am to 8 pm Monday-Friday 10 am to 4 pm Saturday-Sunday *Across the street from [employer]  [facility]  [address] [place], [address] 8 am to 5 pm Monday-Friday * In the [facility] the [facility]   The following sites will take your  insurance:  . [facility]  [phone] Get Driving Directions Find a Provider at this Location  [address] [place], [place] [address] . 10 am to 8 pm Monday-Friday . 12 pm to 8 pm Saturday-Sunday   . [facility]  [phone] Get Driving Directions Find a Provider at this Location  [address], [address] [place], [place] [address] . 8 am to 8 pm Monday-Friday . 9 am to 6 pm Saturday . 11 am to 6 pm Sunday   . [facility] [place]  [phone] Get Driving Directions  [address] [place],  [address] . 8 am to 8 pm Monday-Friday . 8 am to 4 pm Saturday-Sunday   Your e-visit answers were reviewed by a board certified advanced clinical practitioner to complete your personal care plan.  Thank you for using e-Visits.  

## 2017-10-15 NOTE — ED Provider Notes (Signed)
10/15/2017 2:15 PM   DOB: 1934/10/14 / MRN: [id number]  SUBJECTIVE:  Gabriel [name] is a 83 y.o. male presenting for sinus pain and tenderness that started about 3 days ago.  Associates copious nasal congestion as well as cough.  Wife has been checking them at home for fever and tells me that this is been as high as 101.  He has been taking Tylenol Mucinex both which relieve his symptoms somewhat.  He denies shortness of breath, chest pain, leg swelling, new DOE.  He is allergic to lipitor [atorvastatin calcium] and mevacor [lovastatin].   He  has a past medical history of 1st degree AV block, Aortic valve disease, CAD (coronary artery disease), Carpal tunnel syndrome, GERD (gastroesophageal reflux disease), Hyperlipidemia, Hypertension, Insomnia, Ischemic cardiomyopathy, Kidney stone, Neuropathy, OSA (obstructive sleep apnea), RBBB, Small intestinal bacterial overgrowth, Tubular adenoma of colon, and Vertigo.    He  reports that he has quit smoking. he has never used smokeless tobacco. He reports that he does not drink alcohol or use drugs. He  reports that he currently engages in sexual activity. The patient  has a past surgical history that includes Coronary stent placement (2003); Cholecystectomy (2009); meniscus removal- knee (Right); Shoulder open rotator cuff repair (1990); Hernia repair (1994); Cataract extraction, bilateral (2004); Kidney stone surgery (10/2005); Lithotripsy (10/2005); Cardiac catheterization (2003); Coronary angioplasty (2003); Left heart cath (02/05/2014); left heart catheterization with coronary angiogram (N/A, 02/05/2014); percutaneous coronary stent intervention (pci-s) (Right, 02/05/2014); and Knee surgery (Left).  His family history includes Diabetes in his mother; Heart Problems in his mother; Other in his sister; Parkinson's disease (age of onset: 32) in his father.  ROS: Per HPI  OBJECTIVE:  BP 129/72   Pulse 82   Temp 99.6 F (37.6 C)   Resp 16   SpO2 100%    Physical Exam  Constitutional: He appears well-developed. He is active and cooperative.  Non-toxic appearance.  HENT:  Head:    Right Ear: Hearing, tympanic membrane, external ear and ear canal normal.  Left Ear: Hearing, tympanic membrane, external ear and ear canal normal.  Nose: Nose normal. Right sinus exhibits no maxillary sinus tenderness and no frontal sinus tenderness. Left sinus exhibits no maxillary sinus tenderness and no frontal sinus tenderness.  Mouth/Throat: Uvula is midline, oropharynx is clear and moist and mucous membranes are normal. No oropharyngeal exudate, posterior oropharyngeal edema or tonsillar abscesses.  Eyes: Conjunctivae are normal. Pupils are equal, round, and reactive to light.  Cardiovascular: Normal rate, regular rhythm, S1 normal, S2 normal, intact distal pulses and normal pulses. Exam reveals no gallop and no friction rub.  Murmur heard. Pulmonary/Chest: Effort normal. No stridor. No tachypnea. No respiratory distress. He has no wheezes. He has no rales.  Abdominal: He exhibits no distension.  Musculoskeletal: He exhibits no edema.  Lymphadenopathy:       Head (right side): No submandibular and no tonsillar adenopathy present.       Head (left side): No submandibular and no tonsillar adenopathy present.    He has no cervical adenopathy.  Neurological: He is alert.  Skin: Skin is warm and dry. He is not diaphoretic. No pallor.  Vitals reviewed.   No results found for this or any previous visit (from the past 72 hour(s)).  No results found.  ASSESSMENT AND PLAN:  No orders of the defined types were placed in this encounter.    Acute bacterial rhinosinusitis: Given right-sided maxillary tenderness I am going to treat him doxycycline.  I have  given him Tessalon for cough.  Cough      The patient is advised to call or return to clinic if he does not see an improvement in symptoms, or to seek the care of the closest emergency department if  he worsens with the above plan.   Philis Fendt, MHS, PA-C 10/15/2017 2:15 PM    Tereasa Coop, PA-C 10/15/17 1415

## 2017-10-15 NOTE — ED Triage Notes (Signed)
Pt c/o coughing and congestion since Friday. Some fevers at home (101.5).

## 2017-10-15 NOTE — Discharge Instructions (Signed)
Start the doxycycline today.  This is safe to take with all of your other medications.  I have also prescribed you a cough Pearl that should be helpful in times of intractable cough.  Please take as prescribed if needed.

## 2017-10-18 ENCOUNTER — Encounter (HOSPITAL_COMMUNITY): Payer: Self-pay | Admitting: Emergency Medicine

## 2017-10-18 ENCOUNTER — Telehealth: Payer: Self-pay

## 2017-10-18 ENCOUNTER — Inpatient Hospital Stay (HOSPITAL_COMMUNITY)
Admission: EM | Admit: 2017-10-18 | Discharge: 2017-10-20 | DRG: 193 | Disposition: A | Discharge status: Home / Self Care | Payer: Medicare Other | Attending: Internal Medicine | Admitting: Internal Medicine

## 2017-10-18 ENCOUNTER — Emergency Department (HOSPITAL_COMMUNITY): Payer: Medicare Other

## 2017-10-18 ENCOUNTER — Other Ambulatory Visit: Payer: Self-pay

## 2017-10-18 DIAGNOSIS — Z9989 Dependence on other enabling machines and devices: Secondary | ICD-10-CM

## 2017-10-18 DIAGNOSIS — I359 Nonrheumatic aortic valve disorder, unspecified: Secondary | ICD-10-CM | POA: Diagnosis not present

## 2017-10-18 DIAGNOSIS — E7800 Pure hypercholesterolemia, unspecified: Secondary | ICD-10-CM | POA: Diagnosis present

## 2017-10-18 DIAGNOSIS — I2510 Atherosclerotic heart disease of native coronary artery without angina pectoris: Secondary | ICD-10-CM

## 2017-10-18 DIAGNOSIS — G4700 Insomnia, unspecified: Secondary | ICD-10-CM | POA: Diagnosis present

## 2017-10-18 DIAGNOSIS — G4733 Obstructive sleep apnea (adult) (pediatric): Secondary | ICD-10-CM

## 2017-10-18 DIAGNOSIS — E785 Hyperlipidemia, unspecified: Secondary | ICD-10-CM | POA: Diagnosis present

## 2017-10-18 DIAGNOSIS — I4510 Unspecified right bundle-branch block: Secondary | ICD-10-CM | POA: Diagnosis present

## 2017-10-18 DIAGNOSIS — Z9842 Cataract extraction status, left eye: Secondary | ICD-10-CM

## 2017-10-18 DIAGNOSIS — J9601 Acute respiratory failure with hypoxia: Secondary | ICD-10-CM | POA: Diagnosis not present

## 2017-10-18 DIAGNOSIS — I10 Essential (primary) hypertension: Secondary | ICD-10-CM | POA: Diagnosis not present

## 2017-10-18 DIAGNOSIS — I2583 Coronary atherosclerosis due to lipid rich plaque: Secondary | ICD-10-CM

## 2017-10-18 DIAGNOSIS — G5600 Carpal tunnel syndrome, unspecified upper limb: Secondary | ICD-10-CM | POA: Diagnosis present

## 2017-10-18 DIAGNOSIS — I252 Old myocardial infarction: Secondary | ICD-10-CM

## 2017-10-18 DIAGNOSIS — Z888 Allergy status to other drugs, medicaments and biological substances status: Secondary | ICD-10-CM

## 2017-10-18 DIAGNOSIS — Z87891 Personal history of nicotine dependence: Secondary | ICD-10-CM

## 2017-10-18 DIAGNOSIS — R0602 Shortness of breath: Secondary | ICD-10-CM | POA: Diagnosis not present

## 2017-10-18 DIAGNOSIS — I440 Atrioventricular block, first degree: Secondary | ICD-10-CM | POA: Diagnosis present

## 2017-10-18 DIAGNOSIS — J189 Pneumonia, unspecified organism: Secondary | ICD-10-CM | POA: Diagnosis present

## 2017-10-18 DIAGNOSIS — Z87442 Personal history of urinary calculi: Secondary | ICD-10-CM

## 2017-10-18 DIAGNOSIS — J181 Lobar pneumonia, unspecified organism: Secondary | ICD-10-CM | POA: Diagnosis not present

## 2017-10-18 DIAGNOSIS — G629 Polyneuropathy, unspecified: Secondary | ICD-10-CM | POA: Diagnosis present

## 2017-10-18 DIAGNOSIS — R0902 Hypoxemia: Secondary | ICD-10-CM | POA: Diagnosis not present

## 2017-10-18 DIAGNOSIS — Z7902 Long term (current) use of antithrombotics/antiplatelets: Secondary | ICD-10-CM

## 2017-10-18 DIAGNOSIS — R05 Cough: Secondary | ICD-10-CM | POA: Diagnosis not present

## 2017-10-18 DIAGNOSIS — I255 Ischemic cardiomyopathy: Secondary | ICD-10-CM

## 2017-10-18 DIAGNOSIS — R509 Fever, unspecified: Secondary | ICD-10-CM | POA: Diagnosis not present

## 2017-10-18 DIAGNOSIS — Z86010 Personal history of colonic polyps: Secondary | ICD-10-CM

## 2017-10-18 DIAGNOSIS — Z9841 Cataract extraction status, right eye: Secondary | ICD-10-CM

## 2017-10-18 DIAGNOSIS — Z79899 Other long term (current) drug therapy: Secondary | ICD-10-CM

## 2017-10-18 DIAGNOSIS — K219 Gastro-esophageal reflux disease without esophagitis: Secondary | ICD-10-CM | POA: Diagnosis present

## 2017-10-18 DIAGNOSIS — R079 Chest pain, unspecified: Secondary | ICD-10-CM | POA: Diagnosis not present

## 2017-10-18 DIAGNOSIS — Z9049 Acquired absence of other specified parts of digestive tract: Secondary | ICD-10-CM

## 2017-10-18 DIAGNOSIS — Z7982 Long term (current) use of aspirin: Secondary | ICD-10-CM

## 2017-10-18 DIAGNOSIS — Z955 Presence of coronary angioplasty implant and graft: Secondary | ICD-10-CM

## 2017-10-18 LAB — CREATININE, SERUM
Creatinine, Ser: 1.22 mg/dL (ref 0.61–1.24)
GFR calc Af Amer: >60 mL/min (ref >60)
GFR calc non Af Amer: 54 mL/min — ABNORMAL LOW (ref >60)

## 2017-10-18 LAB — CBC WITH DIFFERENTIAL/PLATELET
Basophils Absolute: 0 10*3/uL (ref 0.0–0.1)
Basophils Relative: 0 %
EOS ABS: 0 10*3/uL (ref 0.0–0.7)
EOS PCT: 0 %
HCT: 38.6 % — ABNORMAL LOW (ref 39.0–52.0)
HEMOGLOBIN: 13.3 g/dL (ref 13.0–17.0)
LYMPHS ABS: 1.4 10*3/uL (ref 0.7–4.0)
Lymphocytes Relative: 15 %
MCH: 35.3 pg — AB (ref 26.0–34.0)
MCHC: 34.5 g/dL (ref 30.0–36.0)
MCV: 102.4 fL — ABNORMAL HIGH (ref 78.0–100.0)
MONOS PCT: 14 %
Monocytes Absolute: 1.4 10*3/uL — ABNORMAL HIGH (ref 0.1–1.0)
Neutro Abs: 6.7 10*3/uL (ref 1.7–7.7)
Neutrophils Relative %: 71 %
PLATELETS: 190 10*3/uL (ref 150–400)
RBC: 3.77 MIL/uL — ABNORMAL LOW (ref 4.22–5.81)
RDW: 12.8 % (ref 11.5–15.5)
WBC: 9.5 10*3/uL (ref 4.0–10.5)

## 2017-10-18 LAB — I-STAT TROPONIN, ED: Troponin i, poc: 0.01 ng/mL (ref 0.00–0.08)

## 2017-10-18 LAB — CBC
HCT: 38.6 % — ABNORMAL LOW (ref 39.0–52.0)
HEMOGLOBIN: 13.5 g/dL (ref 13.0–17.0)
MCH: 35.6 pg — ABNORMAL HIGH (ref 26.0–34.0)
MCHC: 35 g/dL (ref 30.0–36.0)
MCV: 101.8 fL — ABNORMAL HIGH (ref 78.0–100.0)
Platelets: 197 10*3/uL (ref 150–400)
RBC: 3.79 MIL/uL — ABNORMAL LOW (ref 4.22–5.81)
RDW: 12.7 % (ref 11.5–15.5)
WBC: 11.6 10*3/uL — ABNORMAL HIGH (ref 4.0–10.5)

## 2017-10-18 LAB — BASIC METABOLIC PANEL
Anion gap: 11 (ref 5–15)
BUN: 21 mg/dL — AB (ref 6–20)
CHLORIDE: 103 mmol/L (ref 101–111)
CO2: 23 mmol/L (ref 22–32)
CREATININE: 1.25 mg/dL — AB (ref 0.61–1.24)
Calcium: 9 mg/dL (ref 8.9–10.3)
GFR calc Af Amer: >60 mL/min (ref >60)
GFR calc non Af Amer: 52 mL/min — ABNORMAL LOW (ref >60)
GLUCOSE: 122 mg/dL — AB (ref 65–99)
Potassium: 4.5 mmol/L (ref 3.5–5.1)
Sodium: 137 mmol/L (ref 135–145)

## 2017-10-18 LAB — INFLUENZA PANEL BY PCR (TYPE A & B)
INFLAPCR: NEGATIVE
Influenza B By PCR: NEGATIVE

## 2017-10-18 LAB — BRAIN NATRIURETIC PEPTIDE: B NATRIURETIC PEPTIDE 5: 34.2 pg/mL (ref 0.0–100.0)

## 2017-10-18 MED ORDER — GABAPENTIN 400 MG PO CAPS
800.0000 mg | ORAL_CAPSULE | Freq: Three times a day (TID) | ORAL | Status: DC
  Administered 2017-10-18 – 2017-10-20 (×6): 800 mg via ORAL
  Filled 2017-10-18 (×6): qty 2

## 2017-10-18 MED ORDER — EZETIMIBE 10 MG PO TABS
10.0000 mg | ORAL_TABLET | Freq: Every day | ORAL | Status: DC
  Administered 2017-10-18 – 2017-10-19 (×2): 10 mg via ORAL
  Filled 2017-10-18 (×3): qty 1

## 2017-10-18 MED ORDER — LORATADINE 10 MG PO TABS
10.0000 mg | ORAL_TABLET | Freq: Every day | ORAL | Status: DC
  Administered 2017-10-18 – 2017-10-20 (×3): 10 mg via ORAL
  Filled 2017-10-18 (×3): qty 1

## 2017-10-18 MED ORDER — METOPROLOL SUCCINATE ER 25 MG PO TB24
25.0000 mg | ORAL_TABLET | Freq: Every day | ORAL | Status: DC
  Administered 2017-10-18 – 2017-10-20 (×3): 25 mg via ORAL
  Filled 2017-10-18 (×3): qty 1

## 2017-10-18 MED ORDER — HEPARIN SODIUM (PORCINE) 5000 UNIT/ML IJ SOLN
5000.0000 [IU] | Freq: Three times a day (TID) | INTRAMUSCULAR | Status: DC
  Administered 2017-10-18: 5000 [IU] via SUBCUTANEOUS
  Filled 2017-10-18 (×3): qty 1

## 2017-10-18 MED ORDER — SODIUM CHLORIDE 0.9 % IJ SOLN
INTRAMUSCULAR | Status: AC
  Filled 2017-10-18: qty 50

## 2017-10-18 MED ORDER — OMEGA-3-ACID ETHYL ESTERS 1 G PO CAPS
1.0000 | ORAL_CAPSULE | Freq: Every day | ORAL | Status: DC
  Administered 2017-10-18 – 2017-10-20 (×3): 1 g via ORAL
  Filled 2017-10-18 (×3): qty 1

## 2017-10-18 MED ORDER — SODIUM CHLORIDE 0.9 % IV SOLN
500.0000 mg | Freq: Once | INTRAVENOUS | Status: AC
  Administered 2017-10-18: 500 mg via INTRAVENOUS
  Filled 2017-10-18: qty 500

## 2017-10-18 MED ORDER — SODIUM CHLORIDE 0.9 % IV SOLN: 500.0000 mL | INTRAVENOUS | Status: DC

## 2017-10-18 MED ORDER — SODIUM CHLORIDE 0.9 % IV SOLN
INTRAVENOUS | Status: AC
  Administered 2017-10-18: 75 mL/h via INTRAVENOUS

## 2017-10-18 MED ORDER — SODIUM CHLORIDE 0.9 % IV BOLUS (SEPSIS)
1000.0000 mL | Freq: Once | INTRAVENOUS | Status: AC
  Administered 2017-10-18: 1000 mL via INTRAVENOUS

## 2017-10-18 MED ORDER — IOPAMIDOL (ISOVUE-370) INJECTION 76%
100.0000 mL | Freq: Once | INTRAVENOUS | Status: AC | PRN
  Administered 2017-10-18: 100 mL via INTRAVENOUS

## 2017-10-18 MED ORDER — CLOPIDOGREL BISULFATE 75 MG PO TABS
75.0000 mg | ORAL_TABLET | Freq: Every day | ORAL | Status: DC
  Administered 2017-10-18 – 2017-10-20 (×3): 75 mg via ORAL
  Filled 2017-10-18 (×3): qty 1

## 2017-10-18 MED ORDER — BENZONATATE 100 MG PO CAPS
100.0000 mg | ORAL_CAPSULE | Freq: Three times a day (TID) | ORAL | Status: DC
  Administered 2017-10-18 – 2017-10-20 (×6): 100 mg via ORAL
  Filled 2017-10-18 (×6): qty 1

## 2017-10-18 MED ORDER — DOXYCYCLINE HYCLATE 100 MG PO TABS
100.0000 mg | ORAL_TABLET | Freq: Two times a day (BID) | ORAL | Status: DC
  Administered 2017-10-18 – 2017-10-20 (×5): 100 mg via ORAL
  Filled 2017-10-18 (×5): qty 1

## 2017-10-18 MED ORDER — IPRATROPIUM-ALBUTEROL 0.5-2.5 (3) MG/3ML IN SOLN
3.0000 mL | Freq: Four times a day (QID) | RESPIRATORY_TRACT | Status: DC
  Administered 2017-10-18 – 2017-10-20 (×6): 3 mL via RESPIRATORY_TRACT
  Filled 2017-10-18 (×6): qty 3

## 2017-10-18 MED ORDER — IOPAMIDOL (ISOVUE-370) INJECTION 76%
INTRAVENOUS | Status: AC
  Filled 2017-10-18: qty 100

## 2017-10-18 MED ORDER — ASPIRIN EC 81 MG PO TBEC
81.0000 mg | DELAYED_RELEASE_TABLET | Freq: Every day | ORAL | Status: DC
  Administered 2017-10-18 – 2017-10-20 (×3): 81 mg via ORAL
  Filled 2017-10-18 (×3): qty 1

## 2017-10-18 MED ORDER — IPRATROPIUM-ALBUTEROL 0.5-2.5 (3) MG/3ML IN SOLN
3.0000 mL | RESPIRATORY_TRACT | Status: DC
  Administered 2017-10-18: 3 mL via RESPIRATORY_TRACT
  Filled 2017-10-18: qty 3

## 2017-10-18 MED ORDER — VITAMIN B-12 1000 MCG PO TABS
1000.0000 ug | ORAL_TABLET | Freq: Every day | ORAL | Status: DC
  Administered 2017-10-18 – 2017-10-20 (×3): 1000 ug via ORAL
  Filled 2017-10-18 (×3): qty 1

## 2017-10-18 MED ORDER — ZOLPIDEM TARTRATE 5 MG PO TABS: 5.0000 mg | ORAL_TABLET | Freq: Every evening | ORAL | Status: DC | PRN

## 2017-10-18 MED ORDER — MAGNESIUM 500 MG PO TABS: 500.0000 mg | ORAL_TABLET | Freq: Every day | ORAL | Status: DC

## 2017-10-18 MED ORDER — AMLODIPINE BESYLATE 5 MG PO TABS
5.0000 mg | ORAL_TABLET | Freq: Every day | ORAL | Status: DC
  Administered 2017-10-18 – 2017-10-20 (×3): 5 mg via ORAL
  Filled 2017-10-18 (×3): qty 1

## 2017-10-18 MED ORDER — IPRATROPIUM-ALBUTEROL 0.5-2.5 (3) MG/3ML IN SOLN
3.0000 mL | Freq: Once | RESPIRATORY_TRACT | Status: AC
  Administered 2017-10-18: 3 mL via RESPIRATORY_TRACT
  Filled 2017-10-18: qty 3

## 2017-10-18 MED ORDER — SODIUM CHLORIDE 0.9 % IV SOLN
1.0000 g | INTRAVENOUS | Status: DC
  Administered 2017-10-19: 1 g via INTRAVENOUS
  Filled 2017-10-18: qty 10
  Filled 2017-10-18: qty 1

## 2017-10-18 MED ORDER — ADULT MULTIVITAMIN W/MINERALS CH
1.0000 | ORAL_TABLET | Freq: Every day | ORAL | Status: DC
  Administered 2017-10-18 – 2017-10-20 (×3): 1 via ORAL
  Filled 2017-10-18 (×3): qty 1

## 2017-10-18 MED ORDER — AZITHROMYCIN 250 MG PO TABS: 500.0000 mg | ORAL_TABLET | ORAL | Status: DC

## 2017-10-18 MED ORDER — MAGNESIUM OXIDE 400 (241.3 MG) MG PO TABS
400.0000 mg | ORAL_TABLET | Freq: Every day | ORAL | Status: DC
  Administered 2017-10-18 – 2017-10-20 (×3): 400 mg via ORAL
  Filled 2017-10-18 (×3): qty 1

## 2017-10-18 MED ORDER — NITROGLYCERIN 0.4 MG SL SUBL: 0.4000 mg | SUBLINGUAL_TABLET | SUBLINGUAL | Status: DC | PRN

## 2017-10-18 MED ORDER — SILDENAFIL CITRATE 20 MG PO TABS: 40.0000 mg | ORAL_TABLET | Freq: Every day | ORAL | Status: DC | PRN

## 2017-10-18 MED ORDER — LISINOPRIL 20 MG PO TABS
20.0000 mg | ORAL_TABLET | Freq: Every day | ORAL | Status: DC
  Administered 2017-10-18 – 2017-10-20 (×3): 20 mg via ORAL
  Filled 2017-10-18 (×3): qty 1

## 2017-10-18 MED ORDER — CO Q 10 100 MG PO CAPS: 200.0000 mg | ORAL_CAPSULE | Freq: Every day | ORAL | Status: DC

## 2017-10-18 MED ORDER — FLUTICASONE PROPIONATE 50 MCG/ACT NA SUSP
1.0000 | Freq: Every day | NASAL | Status: DC
  Administered 2017-10-19 – 2017-10-20 (×2): 1 via NASAL
  Filled 2017-10-18: qty 16

## 2017-10-18 MED ORDER — IPRATROPIUM-ALBUTEROL 0.5-2.5 (3) MG/3ML IN SOLN: 3.0000 mL | RESPIRATORY_TRACT | Status: DC | PRN

## 2017-10-18 MED ORDER — SODIUM CHLORIDE 0.9 % IV SOLN
1.0000 g | Freq: Once | INTRAVENOUS | Status: AC
  Administered 2017-10-18: 1 g via INTRAVENOUS
  Filled 2017-10-18: qty 10

## 2017-10-18 MED ORDER — ATORVASTATIN CALCIUM 10 MG PO TABS
10.0000 mg | ORAL_TABLET | Freq: Every day | ORAL | Status: DC
  Administered 2017-10-18 – 2017-10-20 (×3): 10 mg via ORAL
  Filled 2017-10-18 (×3): qty 1

## 2017-10-18 NOTE — Telephone Encounter (Signed)
Per chart review tab pt is at [facility] ED for admission.

## 2017-10-18 NOTE — ED Triage Notes (Signed)
Pt arriving from home with complaint of shortness of breath. Pt has "hacking" cough which is non-productive. Pt states cough has been present x 5 days. Febrile x 3 days. 1000mg  Tylenol one hour ago. Temp of 99.5 at this time. Pt usually uses CPAP at night. Pt currently on doxycycline and states he did not have a chest xray. Pt denies  Chest pain other than pain associated with cough. Denies N/V/D.   20g Right hand 89%-90% on room air (currently on 3L) 118/81

## 2017-10-18 NOTE — Telephone Encounter (Signed)
PLEASE NOTE: All timestamps contained within this report are represented as [place] Standard Time. CONFIDENTIALTY NOTICE: This fax transmission is intended only for the addressee. It contains information that is legally privileged, confidential or otherwise protected from use or disclosure. If you are not the intended recipient, you are strictly prohibited from reviewing, disclosing, copying using or disseminating any of this information or taking any action in reliance on or regarding this information. If you have received this fax in error, please notify us immediately by telephone so that we can arrange for its return to [place]. Phone: [phone], Toll-Free: [phone], Fax: [phone] Page: 1 of 2 Call Id: [id number] Christine Patient Name: Gabriel [name] Gender: Male DOB: 09-Jan-1935 Age: 83 Y 2 M 9 D Return Phone Number: [phone] (Primary), [phone] (Secondary) Address[place] [address] Client [place] - Client Client Site [facility] Physician Alma Friendly - NP Contact Type Call Who Is Calling Patient / Member / Family / Caregiver Call Type Triage / Clinical Caller Name Asia Favata Relationship To Patient Spouse Return Phone Number [phone] (Primary) Chief Complaint BREATHING - shortness of breath or sounds breathless Reason for Call Symptomatic / Request for [place] states Husband on meds for sinus, and he is worse now, fever 100.7, coughing and loses his breathing when coughing ,heart Pt also , Translation No Nurse Assessment Nurse: Tye Savoy, RN, Lattie Haw Date/Time (Eastern Time): 10/18/2017 12:39:45 AM Confirm and document reason for call. If symptomatic, describe symptoms. ---caller states her husband was dx'd with a sinus infection on atbs, has hx of  cardiac problems. 100.7, has had fever over 3 days, comes down with tylenol. having increased difficulty breathing, uses cpap. cough some up, , having coughing spasm Does the patient have any new or worsening symptoms? ---Yes Will a triage be completed? ---Yes Related visit to physician within the last 2 weeks? ---Yes Does the PT have any chronic conditions? (i.e. diabetes, asthma, etc.) ---Yes List chronic conditions. ---cardiac , has 5 stents, neuropathy Is this a behavioral health or substance abuse call? ---No Guidelines Guideline Title Affirmed Question Affirmed Notes Nurse Date/Time (Eastern Time) Cough - Acute Productive Severe difficulty breathing (e.g., struggling for each breath, speaks in single words) Tye Savoy, RN, Lattie Haw 10/18/2017 12:43:30 AM Disp. Time Eilene Ghazi Time) Disposition Final User 10/18/2017 12:37:18 AM Send to Urgent Queue Almon Register 10/18/2017 12:47:00 AM Send To RN Personal Tye Savoy, RN, Elias Else NOTE: All timestamps contained within this report are represented as [place] Standard Time. CONFIDENTIALTY NOTICE: This fax transmission is intended only for the addressee. It contains information that is legally privileged, confidential or otherwise protected from use or disclosure. If you are not the intended recipient, you are strictly prohibited from reviewing, disclosing, copying using or disseminating any of this information or taking any action in reliance on or regarding this information. If you have received this fax in error, please notify us immediately by telephone so that we can arrange for its return to [place]. Phone: [phone], Toll-Free: [phone], Fax: [phone] Page: 2 of 2 Call Id: [id number] [place]. Time Eilene Ghazi Time) Disposition Final User 10/18/2017 12:59:27 AM 911 Outcome Documentation Tye Savoy, RN, Lattie Haw Reason: call placed to caller who stated EMs was there to take pt to ED 10/18/2017 12:46:15 AM Call EMS 911 Now Yes Tye Savoy, RN,  Leland Johns Disagree/Comply Comply Caller Understands Yes PreDisposition InappropriateToAsk Care Advice Given Per  Guideline CALL EMS 911 NOW: Immediate medical attention is needed. You need to hang up and call 911 (or an ambulance). Psychologist, forensic Discretion: I'll call you back in a few minutes to be sure you were able to reach them.) CARE ADVICE given per Cough - Acute Productive (Adult) guideline. Referrals [facility] [place]

## 2017-10-18 NOTE — ED Notes (Signed)
Bed: WA14 Expected date:  Expected time:  Means of arrival:  Comments: Room 5 

## 2017-10-18 NOTE — ED Notes (Signed)
Report given to Ella, RN.

## 2017-10-18 NOTE — ED Notes (Signed)
Bed: WA05 Expected date:  Expected time:  Means of arrival:  Comments: [facility] 

## 2017-10-18 NOTE — Telephone Encounter (Signed)
Noted, chart reviewed

## 2017-10-18 NOTE — ED Provider Notes (Signed)
[facility] DEPT Provider Note   CSN: [id number] Arrival date & time: 10/18/17  0155     History   Chief Complaint Chief Complaint  Patient presents with  . Cough    HPI Gabriel [name] is a 83 y.o. male.  HPI  [name] is an 83 year old male with a history of CAD, RBBB, hypertension, hyperlipidemia, ischemic cardiomyopathy (EF 40-45%), OSA who presents the emergency department for evaluation of cough, shortness of breath and fever.  Patient reports that he developed a cough five days ago.  He has fevers at home up to 101.4 and has been taking Tylenol every 6 hours with temporary relief.  States that his cough is productive of a yellow sputum.  Overnight he reports that he felt short of breath, his CPAP machine for symptoms take deep breaths and this was difficult for him.  He also has bilateral anterior chest pain with cough only, denies chest pain outside of cough.  He was seen at the urgent care 3 days ago and given doxycycline for a sinus infection.  He also states that he has recurrent sinus infections and has maxillary and frontal sinus pain as well.  Has headache with his sinus pain. Has a somewhat scratchy sore throat with cough only. Also reports bilateral upper and lower body aches.  Denies sick contacts that he is aware of.  He denies history of DVT/PE, states that he recently returned home from a trip to [place] where he was sitting in a car for over 12 hours.  States that he has intermittent bilateral ankle swelling when he is on his feet all day, this is unchanged from baseline.  Denies calf pain. Denies ear pain, neck pain/stiffness, orthopnea, abdominal pain, n/v, diarrhea, dysuria, urinary frequency, lightheadedness.   Past Medical History:  Diagnosis Date  . 1st degree AV block   . Aortic valve disease    a. Mild AS, AI by echo 01/2014.  Marland Kitchen CAD (coronary artery disease)    a. s/p PTCA 1995. b. MI in 2003 s/p stenting to LAD 2003, s/p stent  to Cx 2003. c. prior rotablator to PTCA vessel. d. Abnl nuc 01/2014: s/p DES to LAD, mild-mod nonobstructive disease otherwise.  . Carpal tunnel syndrome   . GERD (gastroesophageal reflux disease)   . Hyperlipidemia    a. Can only tolerate low dose statins.  . Hypertension   . Insomnia   . Ischemic cardiomyopathy    a. EF 40-45% by echo 02/04/14, 40-45% by cath 02/05/14 (similar to 2010).  . Kidney stone   . Neuropathy   . OSA (obstructive sleep apnea)    with upper airway resistancy  . RBBB   . Small intestinal bacterial overgrowth   . Tubular adenoma of colon   . Vertigo    Had Epley maneuver, vestibular rehab.    Patient Active Problem List   Diagnosis Date Noted  . Chronic ethmoidal sinusitis 09/20/2017  . Prediabetes 02/21/2017  . Medicare annual wellness visit, subsequent 01/21/2016  . Hereditary and idiopathic peripheral neuropathy 09/21/2015  . Bilateral carpal tunnel syndrome 09/21/2015  . Sinusitis chronic, ethmoidal 09/18/2014  . CAD (coronary artery disease)   . Aortic valve disease   . Hypertension   . 1st degree AV block   . Ischemic cardiomyopathy   . GERD (gastroesophageal reflux disease)   . Vertigo   . Kidney stone   . Mixed hyperlipidemia   . Nonspecific abnormal unspecified cardiovascular function study 02/03/2014  . OSA on  CPAP 09/18/2013  . Neuropathy 02/21/2011  . Hypercholesterolemia 10/22/2010  . Benign hypertensive heart disease without heart failure     Past Surgical History:  Procedure Laterality Date  . CARDIAC CATHETERIZATION  2003   Following MI  . CATARACT EXTRACTION, BILATERAL  2004  . CHOLECYSTECTOMY  2009  . CORONARY ANGIOPLASTY  2003   LAD  . CORONARY STENT PLACEMENT  2003   LCX  . HERNIA REPAIR  1994   double  . KIDNEY STONE SURGERY  10/2005  . KNEE SURGERY Left   . LEFT HEART CATH  02/05/2014  . LEFT HEART CATHETERIZATION WITH CORONARY ANGIOGRAM N/A 02/05/2014   Procedure: LEFT HEART CATHETERIZATION WITH CORONARY ANGIOGRAM;   Surgeon: Jettie Booze, MD;  Location[facility] CATH LAB;  Service: Cardiovascular;  Laterality: N/A;  . LITHOTRIPSY  10/2005  . meniscus removal- knee Right   . PERCUTANEOUS CORONARY STENT INTERVENTION (PCI-S) Right 02/05/2014   Procedure: PERCUTANEOUS CORONARY STENT INTERVENTION (PCI-S);  Surgeon: Jettie Booze, MD;  Location: [facility] CATH LAB;  Service: Cardiovascular;  Laterality: Right;  . SHOULDER OPEN ROTATOR CUFF REPAIR  1990       Home Medications    Prior to Admission medications   Medication Sig Start Date End Date Taking? Authorizing Provider  amLODipine (NORVASC) 5 MG tablet TAKE 1 TABLET BY MOUTH DAILY. 07/19/17  Yes Nahser, Wonda Cheng, MD  aspirin EC 81 MG tablet Take 81 mg by mouth daily.   Yes [provider]  atorvastatin (LIPITOR) 10 MG tablet TAKE 1 TABLET BY MOUTH DAILY. 08/03/17  Yes Nahser, Wonda Cheng, MD  benzonatate (TESSALON) 100 MG capsule Take 1 capsule (100 mg total) by mouth every 8 (eight) hours. 10/15/17  Yes Tereasa Coop, PA-C  cetirizine (ZYRTEC) 10 MG tablet Take 10 mg by mouth daily.   Yes [provider]  clopidogrel (PLAVIX) 75 MG tablet TAKE 1 TABLET BY MOUTH DAILY. 01/23/17  Yes Nahser, Wonda Cheng, MD  Coenzyme Q10 (CO Q 10 PO) Take 200 mg by mouth daily.    Yes [provider]  cyanocobalamin 1000 MCG tablet Take 1,000 mcg by mouth daily.   Yes [provider]  doxycycline (VIBRAMYCIN) 100 MG capsule Take 1 capsule (100 mg total) by mouth 2 (two) times daily for 10 days. 10/15/17 10/25/17 Yes Tereasa Coop, PA-C  ezetimibe (ZETIA) 10 MG tablet TAKE 1 TABLET BY MOUTH DAILY. 10/27/16  Yes Nahser, Wonda Cheng, MD  fluticasone (FLONASE) 50 MCG/ACT nasal spray Place 1 spray into both nostrils daily.   Yes [provider]  gabapentin (NEURONTIN) 800 MG tablet TAKE 1 TABLET BY MOUTH 3 TIMES DAILY. 08/02/17  Yes Dohmeier, Asencion Partridge, MD  lisinopril (PRINIVIL,ZESTRIL) 20 MG tablet TAKE 1 TABLET (20 MG TOTAL) BY MOUTH DAILY.  11/30/16  Yes Nahser, Wonda Cheng, MD  Magnesium 500 MG TABS Take 500 mg by mouth daily.   Yes [provider]  metoprolol succinate (TOPROL-XL) 25 MG 24 hr tablet TAKE 1 TABLET BY MOUTH DAILY. 04/21/17  Yes Nahser, Wonda Cheng, MD  Multiple Vitamins-Minerals (MULTIVITAMINS THER. W/MINERALS) TABS Take 1 tablet by mouth daily.   Yes [provider]  nitroGLYCERIN (NITROSTAT) 0.4 MG SL tablet Place 1 tablet (0.4 mg total) under the tongue every 5 (five) minutes as needed for chest pain. 08/04/16  Yes Nahser, Wonda Cheng, MD  Omega-3 Fatty Acids (OMEGA 3 PO) Take 1 tablet by mouth daily.   Yes [provider]  Wheat Dextrin (BENEFIBER PO) Take 5 g  by mouth daily.   Yes [provider]  zolpidem (AMBIEN) 10 MG tablet TAKE 1 TABLET BY MOUTH AT BEDTIME AS NEEDED FOR SLEEP 09/03/17  Yes Pleas Koch, NP  sildenafil (REVATIO) 20 MG tablet Take 2 tablets (40 mg total) by mouth daily as needed (erectile dysfunction). 01/21/16   Pleas Koch, NP    Family History Family History  Problem Relation Age of Onset  . Heart Problems Mother   . Diabetes Mother   . Parkinson's disease Father 18  . Other Sister        Muscular degeneration  . Heart attack Neg Hx   . Stroke Neg Hx     Social History Social History   Tobacco Use  . Smoking status: Former Research scientist (life sciences)  . Smokeless tobacco: Never Used  . Tobacco comment: 1968  Substance Use Topics  . Alcohol use: No    Alcohol/week: 0.0 oz    Comment: rarely  . Drug use: No     Allergies   Lipitor [atorvastatin calcium] and Mevacor [lovastatin]   Review of Systems Review of Systems  Constitutional: Positive for chills, fatigue and fever.  HENT: Positive for congestion, postnasal drip, rhinorrhea, sinus pressure, sinus pain and sore throat. Negative for ear pain, trouble swallowing and voice change.   Eyes: Negative for visual disturbance.  Respiratory: Positive for cough and shortness of breath. Negative for wheezing.     Cardiovascular: Positive for chest pain (with cough) and leg swelling (intermittent ankle swelling).  Gastrointestinal: Negative for abdominal pain, diarrhea, nausea and vomiting.  Genitourinary: Negative for difficulty urinating, dysuria, flank pain and frequency.  Musculoskeletal: Negative for gait problem, neck pain and neck stiffness.  Skin: Negative for rash.  Neurological: Positive for headaches. Negative for weakness and numbness.  Psychiatric/Behavioral: Negative for agitation.     Physical Exam Updated Vital Signs BP 139/78   Pulse 85   Temp 99 F (37.2 C) (Oral)   Resp 18   SpO2 91%   Physical Exam  Constitutional: He is oriented to person, place, and time. He appears well-developed and well-nourished. No distress.  Coughing at bedside  HENT:  Head: Normocephalic and atraumatic.  Mouth/Throat: No oropharyngeal exudate.  Mucous membranes moist.  Posterior oropharynx without erythema.  No tonsillar exudate or edema.  Uvula midline.  Airway patent and able to handle oral secretions.  Eyes: Conjunctivae are normal. Pupils are equal, round, and reactive to light. Right eye exhibits no discharge. Left eye exhibits no discharge.  Neck: Normal range of motion. Neck supple.  Cardiovascular: Normal rate, regular rhythm and intact distal pulses.  Murmur heard. Systolic murmur grade 3/6  Pulmonary/Chest: Effort normal. No respiratory distress.  No respiratory distress, speaking in full sentences. Dry cough with deep inhalation. Inspiratory and expiratory wheezes heard in upper and lower bilateral lung fields. No rhonchi, rales or stridor.   Abdominal: Soft. Bowel sounds are normal. There is no tenderness. There is no guarding.  Musculoskeletal:  No leg swelling or calf tenderness.   Lymphadenopathy:    He has no cervical adenopathy.  Neurological: He is alert and oriented to person, place, and time. Coordination normal.  Skin: Skin is warm and dry. Capillary refill takes less  than 2 seconds. He is not diaphoretic.  Psychiatric: He has a normal mood and affect. His behavior is normal.  Nursing note and vitals reviewed.    ED Treatments / Results  Labs (all labs ordered are listed, but only abnormal results are displayed) Labs Reviewed  CBC WITH DIFFERENTIAL/PLATELET - Abnormal; Notable for the following components:      Result Value   RBC 3.77 (*)    HCT 38.6 (*)    MCV 102.4 (*)    MCH 35.3 (*)    Monocytes Absolute 1.4 (*)    All other components within normal limits  BASIC METABOLIC PANEL - Abnormal; Notable for the following components:   Glucose, Bld 122 (*)    BUN 21 (*)    Creatinine, Ser 1.25 (*)    GFR calc non Af Amer 52 (*)    All other components within normal limits  INFLUENZA PANEL BY PCR (TYPE A & B)  BRAIN NATRIURETIC PEPTIDE  I-STAT TROPONIN, ED    EKG  EKG Interpretation  Date/Time:  Wednesday October 18 2017 08:58:11 EDT Ventricular Rate:  106 PR Interval:    QRS Duration: 142 QT Interval:  451 QTC Calculation: 599 R Axis:   -66 Text Interpretation:  Sinus or ectopic atrial tachycardia Multiform ventricular premature complexes Prolonged PR interval RBBB and LAFB No significant change since last tracing Confirmed by Lacretia Leigh ([id number]) on 10/18/2017 9:36:26 AM       Radiology Dg Chest 2 View  Result Date: 10/18/2017 CLINICAL DATA:  Cough, congestion and fever. EXAM: CHEST - 2 VIEW COMPARISON:  10/12/2011 FINDINGS: Mild cardiomegaly with normal mediastinal contours. Mild bibasilar atelectasis. Pulmonary vasculature is normal. No consolidation, pleural effusion, or pneumothorax. No acute osseous abnormalities are seen. IMPRESSION: 1. Mild bibasilar atelectasis.  No confluent airspace disease. 2. Mild cardiomegaly. Electronically Signed   By: Jeb Levering M.D.   On: 10/18/2017 05:55   Ct Angio Chest Pe W And/or Wo Contrast  Result Date: 10/18/2017 CLINICAL DATA:  Shortness of Breath EXAM: CT ANGIOGRAPHY CHEST WITH  CONTRAST TECHNIQUE: Multidetector CT imaging of the chest was performed using the standard protocol during bolus administration of intravenous contrast. Multiplanar CT image reconstructions and MIPs were obtained to evaluate the vascular anatomy. CONTRAST:  144mL ISOVUE-370 IOPAMIDOL (ISOVUE-370) INJECTION 76% COMPARISON:  Chest radiograph October 18, 2017 FINDINGS: Cardiovascular: There is no appreciable pulmonary embolus. There is no appreciable thoracic aortic aneurysm or dissection. There is atherosclerotic calcification in portions of the left subclavian artery. There is aortic atherosclerosis. There are foci of coronary artery calcification. There is no appreciable pleural effusion or pleural thickening. Mediastinum/Nodes: Thyroid appears normal. There are subcentimeter mediastinal lymph nodes. There is a small hiatal hernia. Lungs/Pleura: There is focal airspace consolidation consistent with pneumonia in the periphery of the superior segment right lower lobe. There is minimal infiltrate in the lateral segment right middle lobe. There is mild bibasilar atelectasis. No pleural effusion or pleural thickening evident. Upper Abdomen: Visualized upper abdominal structures appear normal. Musculoskeletal: There is degenerative change in the thoracic spine. There are no blastic or lytic bone lesions. Review of the MIP images confirms the above findings. IMPRESSION: 1. No demonstrable pulmonary embolus. No thoracic aortic aneurysm or dissection. 2. Focal airspace consolidation in the periphery of the superior segment right lower lobe. This consolidation is felt to represent pneumonia. Minimal focus of pneumonia also noted in the periphery of the lateral segment of the right middle lobe. 3.  No evident thoracic adenopathy. 4. Foci of aortic atherosclerosis as well as coronary artery and great vessel calcification. 5.  Small hiatal hernia. Aortic Atherosclerosis (ICD10-I70.0). Electronically Signed   By: Lowella Grip  III M.D.   On: 10/18/2017 09:15    Procedures Procedures (including critical care time)  Medications Ordered in  ED Medications  sodium chloride 0.9 % injection (not administered)  iopamidol (ISOVUE-370) 76 % injection (not administered)  azithromycin (ZITHROMAX) 500 mg in sodium chloride 0.9 % 250 mL IVPB (500 mg Intravenous New Bag/Given 10/18/17 0943)  ipratropium-albuterol (DUONEB) 0.5-2.5 (3) MG/3ML nebulizer solution 3 mL (3 mLs Nebulization Given 10/18/17 0657)  iopamidol (ISOVUE-370) 76 % injection 100 mL (100 mLs Intravenous Contrast Given 10/18/17 0829)  cefTRIAXone (ROCEPHIN) 1 g in sodium chloride 0.9 % 100 mL IVPB (1 g Intravenous New Bag/Given 10/18/17 0935)  sodium chloride 0.9 % bolus 1,000 mL (1,000 mLs Intravenous New Bag/Given 10/18/17 0947)     Initial Impression / Assessment and Plan / ED Course  I have reviewed the triage vital signs and the nursing notes.  Pertinent labs & imaging results that were available during my care of the patient were reviewed by me and considered in my medical decision making (see chart for details).    Patient presents with cough, shortness of breath and fever for the past several days.  He also endorses chest pain with cough or palpation over the chest wall.  He has a new oxygen requirement of 3 L O2 nasal cannula.  He desats to 88% on room air.  Chest x-ray revealed bibasilar atelectasis.  Labs reviewed.  CBC unremarkable, no leukocytosis.  BMP reveals mildly elevated glucose (BG 122), creatinine 1.25 which is baseline.  Influenza negative.  Discussed this patient with Dr. Zenia Resides.  Given new O2 requirement, benign lung exam and recent prolonged travel with sitting >12 hours, plan to get CT angio chest to evaluate for pulmonary embolism.  Will also get BNP.   CT angio chest reveals focal consolidation in the right lower lobe, consistent with pneumonia.  No pulmonary embolus.  BNP WNL.  Patient is started on IV Rocephin and azithromycin for  community-acquired pneumonia.  Discussed this patient with Hospitalist Dr. Reesa Chew who will admit patient for community-acquired pneumonia with new oxygen requirement.  Patient informed.  Final Clinical Impressions(s) / ED Diagnoses   Final diagnoses:  History of colon polyps    ED Discharge Orders    None       Glyn Ade, PA-C 10/18/17 1941

## 2017-10-18 NOTE — ED Provider Notes (Signed)
Medical screening examination/treatment/procedure(s) were conducted as a shared visit with non-physician practitioner(s) and myself.  I personally evaluated the patient during the encounter.   EKG Interpretation  Date/Time:  Wednesday October 18 2017 08:58:11 EDT Ventricular Rate:  106 PR Interval:    QRS Duration: 142 QT Interval:  451 QTC Calculation: 599 R Axis:   -66 Text Interpretation:  Sinus or ectopic atrial tachycardia Multiform ventricular premature complexes Prolonged PR interval RBBB and LAFB No significant change since last tracing Confirmed by Lacretia Leigh ([id number]) on 10/18/2017 9:36:26 AM      Patient here with cough and congestion.  Also with some mild hypoxia.  Chest x-ray negative but CT shows that patient has pneumonia.  Patient started on IV antibiotics and be admitted to the hospitalist service   Lacretia Leigh, MD 10/18/17 [phone]

## 2017-10-18 NOTE — H&P (Signed)
History and Physical    Gabriel [name] RAQ:[id number] DOB: Aug 26, 1934 DOA: 10/18/2017  PCP: Pleas Koch, NP Patient coming from: Home  Chief Complaint: Shortness of breath  HPI: Gabriel [name] is a 83 y.o. male with medical history significant of ischemic cardiomyopathy with multiple stents, essential hypertension, obstructive sleep apnea, insomnia comes to the hospital for evaluation of shortness of breath.  Patient returned from [place] last week and over the weekend he was having cough and low-grade temperature of 100.5 degrees.  On Sunday he also started having mild respiratory distress at rest and felt very congested, due to this he ended up going to urgent care.  Chest x-ray was negative over there according to him and was on oral doxycycline.  Patient took sent home his medications on Monday and Tuesday but did not feel any better and felt even more congested therefore came to the ER for further evaluation. Last week he had driven back from [place] therefore there were some concerns of pulmonary embolism but he denied any lower extremity swelling, chest pain, palpitation, dizziness. In the ER his routine lab work was mostly unremarkable.  BNP was 34.2.  Had a CTA of the chest done which was negative for pulmonary embolism but showed right lower lobe pneumonia.  On room air he was noted to have saturation of 88% therefore admitted to the hospital for further care.   Review of Systems: As per HPI otherwise 10 point review of systems negative.  Review of Systems Otherwise negative except as per HPI, including: General: Denies fever, chills, night sweats or unintended weight loss. Resp: Denies cough, wheezing, shortness of breath. Cardiac: Denies chest pain, palpitations, orthopnea, paroxysmal nocturnal dyspnea. GI: Denies abdominal pain, nausea, vomiting, diarrhea or constipation GU: Denies dysuria, frequency, hesitancy or incontinence MS: Denies muscle aches, joint pain or  swelling Neuro: Denies headache, neurologic deficits (focal weakness, numbness, tingling), abnormal gait Psych: Denies anxiety, depression, SI/HI/AVH Skin: Denies new rashes or lesions ID: Denies sick contacts, exotic exposures, travel  Past Medical History:  Diagnosis Date  . 1st degree AV block   . Aortic valve disease    a. Mild AS, AI by echo 01/2014.  Marland Kitchen CAD (coronary artery disease)    a. s/p PTCA 1995. b. MI in 2003 s/p stenting to LAD 2003, s/p stent to Cx 2003. c. prior rotablator to PTCA vessel. d. Abnl nuc 01/2014: s/p DES to LAD, mild-mod nonobstructive disease otherwise.  . Carpal tunnel syndrome   . GERD (gastroesophageal reflux disease)   . Hyperlipidemia    a. Can only tolerate low dose statins.  . Hypertension   . Insomnia   . Ischemic cardiomyopathy    a. EF 40-45% by echo 02/04/14, 40-45% by cath 02/05/14 (similar to 2010).  . Kidney stone   . Neuropathy   . OSA (obstructive sleep apnea)    with upper airway resistancy  . RBBB   . Small intestinal bacterial overgrowth   . Tubular adenoma of colon   . Vertigo    Had Epley maneuver, vestibular rehab.    Past Surgical History:  Procedure Laterality Date  . CARDIAC CATHETERIZATION  2003   Following MI  . CATARACT EXTRACTION, BILATERAL  2004  . CHOLECYSTECTOMY  2009  . CORONARY ANGIOPLASTY  2003   LAD  . CORONARY STENT PLACEMENT  2003   LCX  . HERNIA REPAIR  1994   double  . KIDNEY STONE SURGERY  10/2005  . KNEE SURGERY Left   . LEFT  HEART CATH  02/05/2014  . LEFT HEART CATHETERIZATION WITH CORONARY ANGIOGRAM N/A 02/05/2014   Procedure: LEFT HEART CATHETERIZATION WITH CORONARY ANGIOGRAM;  Surgeon: Jettie Booze, MD;  Location: [facility] CATH LAB;  Service: Cardiovascular;  Laterality: N/A;  . LITHOTRIPSY  10/2005  . meniscus removal- knee Right   . PERCUTANEOUS CORONARY STENT INTERVENTION (PCI-S) Right 02/05/2014   Procedure: PERCUTANEOUS CORONARY STENT INTERVENTION (PCI-S);  Surgeon: Jettie Booze, MD;   Location: [facility] CATH LAB;  Service: Cardiovascular;  Laterality: Right;  . SHOULDER OPEN ROTATOR CUFF REPAIR  1990     reports that he has quit smoking. he has never used smokeless tobacco. He reports that he does not drink alcohol or use drugs.  Allergies  Allergen Reactions  . Lipitor [Atorvastatin Calcium] Other (See Comments)    Muscle pain--Pt stated he can tolerate a low dose  . Mevacor [Lovastatin] Other (See Comments)    "makes me feel loopy"    Family History  Problem Relation Age of Onset  . Heart Problems Mother   . Diabetes Mother   . Parkinson's disease Father 88  . Other Sister        Muscular degeneration  . Heart attack Neg Hx   . Stroke Neg Hx      Prior to Admission medications   Medication Sig Start Date End Date Taking? Authorizing Provider  amLODipine (NORVASC) 5 MG tablet TAKE 1 TABLET BY MOUTH DAILY. 07/19/17  Yes Nahser, Wonda Cheng, MD  aspirin EC 81 MG tablet Take 81 mg by mouth daily.   Yes [provider]  atorvastatin (LIPITOR) 10 MG tablet TAKE 1 TABLET BY MOUTH DAILY. 08/03/17  Yes Nahser, Wonda Cheng, MD  benzonatate (TESSALON) 100 MG capsule Take 1 capsule (100 mg total) by mouth every 8 (eight) hours. 10/15/17  Yes Tereasa Coop, PA-C  cetirizine (ZYRTEC) 10 MG tablet Take 10 mg by mouth daily.   Yes [provider]  clopidogrel (PLAVIX) 75 MG tablet TAKE 1 TABLET BY MOUTH DAILY. 01/23/17  Yes Nahser, Wonda Cheng, MD  Coenzyme Q10 (CO Q 10 PO) Take 200 mg by mouth daily.    Yes [provider]  cyanocobalamin 1000 MCG tablet Take 1,000 mcg by mouth daily.   Yes [provider]  doxycycline (VIBRAMYCIN) 100 MG capsule Take 1 capsule (100 mg total) by mouth 2 (two) times daily for 10 days. 10/15/17 10/25/17 Yes Tereasa Coop, PA-C  ezetimibe (ZETIA) 10 MG tablet TAKE 1 TABLET BY MOUTH DAILY. 10/27/16  Yes Nahser, Wonda Cheng, MD  fluticasone (FLONASE) 50 MCG/ACT nasal spray Place 1 spray into both nostrils daily.   Yes  [provider]  gabapentin (NEURONTIN) 800 MG tablet TAKE 1 TABLET BY MOUTH 3 TIMES DAILY. 08/02/17  Yes Dohmeier, Asencion Partridge, MD  lisinopril (PRINIVIL,ZESTRIL) 20 MG tablet TAKE 1 TABLET (20 MG TOTAL) BY MOUTH DAILY. 11/30/16  Yes Nahser, Wonda Cheng, MD  Magnesium 500 MG TABS Take 500 mg by mouth daily.   Yes [provider]  metoprolol succinate (TOPROL-XL) 25 MG 24 hr tablet TAKE 1 TABLET BY MOUTH DAILY. 04/21/17  Yes Nahser, Wonda Cheng, MD  Multiple Vitamins-Minerals (MULTIVITAMINS THER. W/MINERALS) TABS Take 1 tablet by mouth daily.   Yes [provider]  nitroGLYCERIN (NITROSTAT) 0.4 MG SL tablet Place 1 tablet (0.4 mg total) under the tongue every 5 (five) minutes as needed for chest pain. 08/04/16  Yes Nahser, Wonda Cheng, MD  Omega-3 Fatty Acids (OMEGA 3 PO) Take 1 tablet  by mouth daily.   Yes [provider]  Wheat Dextrin (BENEFIBER PO) Take 5 g by mouth daily.   Yes [provider]  zolpidem (AMBIEN) 10 MG tablet TAKE 1 TABLET BY MOUTH AT BEDTIME AS NEEDED FOR SLEEP 09/03/17  Yes Pleas Koch, NP  sildenafil (REVATIO) 20 MG tablet Take 2 tablets (40 mg total) by mouth daily as needed (erectile dysfunction). 01/21/16   Pleas Koch, NP    Physical Exam: Vitals:   10/18/17 0845 10/18/17 0900 10/18/17 0922 10/18/17 0926  BP:   131/75 139/69  Pulse: (!) 113 (!) 103 (!) 101 (!) 103  Resp: 17 18 (!) 27 (!) 23  Temp:      TempSrc:      SpO2: 93% 96% 93% 95%      Constitutional: NAD, calm, comfortable, on 2 L nasal cannula Vitals:   10/18/17 0845 10/18/17 0900 10/18/17 0922 10/18/17 0926  BP:   131/75 139/69  Pulse: (!) 113 (!) 103 (!) 101 (!) 103  Resp: 17 18 (!) 27 (!) 23  Temp:      TempSrc:      SpO2: 93% 96% 93% 95%   Eyes: PERRL, lids and conjunctivae normal ENMT: Mucous membranes are moist. Posterior pharynx clear of any exudate or lesions.Normal dentition.  Neck: normal, supple, no masses, no thyromegaly Respiratory: Bilateral  rhonchi especially at the bases in the right Cardiovascular: Regular rate and rhythm, no murmurs / rubs / gallops. No extremity edema. 2+ pedal pulses. No carotid bruits.  Abdomen: no tenderness, no masses palpated. No hepatosplenomegaly. Bowel sounds positive.  Musculoskeletal: no clubbing / cyanosis. No joint deformity upper and lower extremities. Good ROM, no contractures. Normal muscle tone.  Skin: no rashes, lesions, ulcers. No induration Neurologic: CN 2-12 grossly intact. Sensation intact, DTR normal. Strength 5/5 in all 4.  Psychiatric: Normal judgment and insight. Alert and oriented x 3. Normal mood.     Labs on Admission: I have personally reviewed following labs and imaging studies  CBC: Recent Labs  Lab 10/18/17 0212  WBC 9.5  NEUTROABS 6.7  HGB 13.3  HCT 38.6*  MCV 102.4*  PLT 409   Basic Metabolic Panel: Recent Labs  Lab 10/18/17 0212  NA 137  K 4.5  CL 103  CO2 23  GLUCOSE 122*  BUN 21*  CREATININE 1.25*  CALCIUM 9.0   GFR: CrCl cannot be calculated (Unknown ideal weight.). Liver Function Tests: No results for input(s): AST, ALT, ALKPHOS, BILITOT, PROT, ALBUMIN in the last 168 hours. No results for input(s): LIPASE, AMYLASE in the last 168 hours. No results for input(s): AMMONIA in the last 168 hours. Coagulation Profile: No results for input(s): INR, PROTIME in the last 168 hours. Cardiac Enzymes: No results for input(s): CKTOTAL, CKMB, CKMBINDEX, TROPONINI in the last 168 hours. BNP (last 3 results) No results for input(s): PROBNP in the last 8760 hours. HbA1C: No results for input(s): HGBA1C in the last 72 hours. CBG: No results for input(s): GLUCAP in the last 168 hours. Lipid Profile: No results for input(s): CHOL, HDL, LDLCALC, TRIG, CHOLHDL, LDLDIRECT in the last 72 hours. Thyroid Function Tests: No results for input(s): TSH, T4TOTAL, FREET4, T3FREE, THYROIDAB in the last 72 hours. Anemia Panel: No results for input(s): VITAMINB12,  FOLATE, FERRITIN, TIBC, IRON, RETICCTPCT in the last 72 hours. Urine analysis:    Component Value Date/Time   COLORURINE AMBER (A) 12/14/2011 1050   APPEARANCEUR HAZY (A) 12/14/2011 1050   LABSPEC 1.027 12/14/2011 1050  PHURINE 5.5 12/14/2011 1050   GLUCOSEU NEGATIVE 12/14/2011 1050   HGBUR NEGATIVE 12/14/2011 1050   BILIRUBINUR SMALL (A) 12/14/2011 1050   KETONESUR 15 (A) 12/14/2011 1050   PROTEINUR 30 (A) 12/14/2011 1050   UROBILINOGEN 0.2 12/14/2011 1050   NITRITE NEGATIVE 12/14/2011 1050   LEUKOCYTESUR NEGATIVE 12/14/2011 1050   Sepsis Labs: !!!!!!!!!!!!!!!!!!!!!!!!!!!!!!!!!!!!!!!!!!!! @LABRCNTIP (procalcitonin:4,lacticidven:4) )No results found for this or any previous visit (from the past 240 hour(s)).   Radiological Exams on Admission: Dg Chest 2 View  Result Date: 10/18/2017 CLINICAL DATA:  Cough, congestion and fever. EXAM: CHEST - 2 VIEW COMPARISON:  10/12/2011 FINDINGS: Mild cardiomegaly with normal mediastinal contours. Mild bibasilar atelectasis. Pulmonary vasculature is normal. No consolidation, pleural effusion, or pneumothorax. No acute osseous abnormalities are seen. IMPRESSION: 1. Mild bibasilar atelectasis.  No confluent airspace disease. 2. Mild cardiomegaly. Electronically Signed   By: Jeb Levering M.D.   On: 10/18/2017 05:55   Ct Angio Chest Pe W And/or Wo Contrast  Result Date: 10/18/2017 CLINICAL DATA:  Shortness of Breath EXAM: CT ANGIOGRAPHY CHEST WITH CONTRAST TECHNIQUE: Multidetector CT imaging of the chest was performed using the standard protocol during bolus administration of intravenous contrast. Multiplanar CT image reconstructions and MIPs were obtained to evaluate the vascular anatomy. CONTRAST:  170mL ISOVUE-370 IOPAMIDOL (ISOVUE-370) INJECTION 76% COMPARISON:  Chest radiograph October 18, 2017 FINDINGS: Cardiovascular: There is no appreciable pulmonary embolus. There is no appreciable thoracic aortic aneurysm or dissection. There is atherosclerotic  calcification in portions of the left subclavian artery. There is aortic atherosclerosis. There are foci of coronary artery calcification. There is no appreciable pleural effusion or pleural thickening. Mediastinum/Nodes: Thyroid appears normal. There are subcentimeter mediastinal lymph nodes. There is a small hiatal hernia. Lungs/Pleura: There is focal airspace consolidation consistent with pneumonia in the periphery of the superior segment right lower lobe. There is minimal infiltrate in the lateral segment right middle lobe. There is mild bibasilar atelectasis. No pleural effusion or pleural thickening evident. Upper Abdomen: Visualized upper abdominal structures appear normal. Musculoskeletal: There is degenerative change in the thoracic spine. There are no blastic or lytic bone lesions. Review of the MIP images confirms the above findings. IMPRESSION: 1. No demonstrable pulmonary embolus. No thoracic aortic aneurysm or dissection. 2. Focal airspace consolidation in the periphery of the superior segment right lower lobe. This consolidation is felt to represent pneumonia. Minimal focus of pneumonia also noted in the periphery of the lateral segment of the right middle lobe. 3.  No evident thoracic adenopathy. 4. Foci of aortic atherosclerosis as well as coronary artery and great vessel calcification. 5.  Small hiatal hernia. Aortic Atherosclerosis (ICD10-I70.0). Electronically Signed   By: Lowella Grip III M.D.   On: 10/18/2017 09:15    EKG: Independently reviewed. Prolong qtc no acute st t changes   Assessment/Plan Principal Problem:   CAP (community acquired pneumonia) Active Problems:   Hypercholesterolemia   OSA on CPAP   CAD (coronary artery disease)   Hypertension   Ischemic cardiomyopathy   GERD (gastroesophageal reflux disease)   Hypoxia    Acute mild hypoxic failure, 87% room air Right lower lobe community-acquired pneumonia - Admit the patient for further care under observation.   Despite of taking 2 days of outpatient doxycycline he did not feel better therefore admitting for IV antibiotics and nebulizer treatments -Influenza is negative, check respiratory panel -Sputum culture and blood cultures ordered -CTA chest done in the ER was negative for PE but showed right lower lobe consolidation suggestive of pneumonia -Start  Rocephin and PO Doxy. (prolong qtc therefore closely monitor) -Supplemental oxygen as necessary - Nebulizer treatments every 4 hours scheduled and every 2 hours as needed -Incentive spirometry and flutter valve to enhance coughing and mobilize secretions  History of ischemic cardiomyopathy with coronary artery disease, stable -Continue aspirin, statin, Plavix, lisinopril 20 mg daily, Toprol-XL 25 mg daily  Essential hypertension -On Norvasc, lisinopril and Toprol  GERD -PPI as necessary  Obstructive sleep apnea - CPAP at bedtime  Insomnia -Ambien as needed for sleep at night  DVT prophylaxis: Early ambulation  Code Status: Full Code  Family Communication: Wife and daughter at bedside  Disposition Plan:  Can be discharge on PO Abx once hypoxia is better  Consults called: None Admission status: MedSurg Obs   Ryan Palermo Arsenio Loader MD [facility] Pager [phone]  If 7PM-7AM, please contact night-coverage [email or web address] Password [facility]  10/18/2017, 10:53 AM

## 2017-10-18 NOTE — ED Notes (Signed)
ED TO INPATIENT HANDOFF REPORT  Name/Age/Gender Gabriel [name] 83 y.o. male  Code Status    Code Status Orders  (From admission, onward)        Start     Ordered   10/18/17 1050  Full code  Continuous     10/18/17 1051    Code Status History    Date Active Date Inactive Code Status Order ID Comments User Context   02/05/2014 15:11 02/06/2014 13:58 Full Code [id number]  Jettie Booze, MD Inpatient    Advance Directive Documentation     Most Recent Value  Type of Advance Directive  Healthcare Power of Attorney, Living will  Pre-existing out of facility DNR order (yellow form or pink MOST form)  No data  "MOST" Form in Place?  No data      Home/SNF/Other Home  Chief Complaint cough  Level of Care/Admitting Diagnosis ED Disposition    ED Disposition Condition Comment   Admit  Hospital Area: [facility] [[id number]]  Level of Care: Med-Surg [16]  Diagnosis: CAP (community acquired pneumonia) [169678]  Admitting Physician: Gerlean Ren [facility] [[id number]]  Attending Physician: Gerlean Ren [facility] [[id number]]  PT Class (Do Not Modify): Observation [104]  PT Acc Code (Do Not Modify): Observation [10022]       Medical History Past Medical History:  Diagnosis Date  . 1st degree AV block   . Aortic valve disease    a. Mild AS, AI by echo 01/2014.  Marland Kitchen CAD (coronary artery disease)    a. s/p PTCA 1995. b. MI in 2003 s/p stenting to LAD 2003, s/p stent to Cx 2003. c. prior rotablator to PTCA vessel. d. Abnl nuc 01/2014: s/p DES to LAD, mild-mod nonobstructive disease otherwise.  . Carpal tunnel syndrome   . GERD (gastroesophageal reflux disease)   . Hyperlipidemia    a. Can only tolerate low dose statins.  . Hypertension   . Insomnia   . Ischemic cardiomyopathy    a. EF 40-45% by echo 02/04/14, 40-45% by cath 02/05/14 (similar to 2010).  . Kidney stone   . Neuropathy   . OSA (obstructive sleep apnea)    with upper airway resistancy  . RBBB   . Small  intestinal bacterial overgrowth   . Tubular adenoma of colon   . Vertigo    Had Epley maneuver, vestibular rehab.    Allergies Allergies  Allergen Reactions  . Lipitor [Atorvastatin Calcium] Other (See Comments)    Muscle pain--Pt stated he can tolerate a low dose  . Mevacor [Lovastatin] Other (See Comments)    "makes me feel loopy"    IV Location/Drains/Wounds Patient Lines/Drains/Airways Status   Active Line/Drains/Airways    Name:   Placement date:   Placement time:   Site:   Days:   Peripheral IV 10/18/17 Right Hand   10/18/17    -    Hand   less than 1   Peripheral IV 10/18/17 Right Antecubital   10/18/17    0840    Antecubital   less than 1          Labs/Imaging Results for orders placed or performed during the hospital encounter of 10/18/17 (from the past 48 hour(s))  CBC with Differential     Status: Abnormal   Collection Time: 10/18/17  2:12 AM  Result Value Ref Range   WBC 9.5 4.0 - 10.5 K/uL   RBC 3.77 (L) 4.22 - 5.81 MIL/uL   Hemoglobin 13.3 13.0 - 17.0 g/dL  HCT 38.6 (L) 39.0 - 52.0 %   MCV 102.4 (H) 78.0 - 100.0 fL   MCH 35.3 (H) 26.0 - 34.0 pg   MCHC 34.5 30.0 - 36.0 g/dL   RDW 12.8 11.5 - 15.5 %   Platelets 190 150 - 400 K/uL   Neutrophils Relative % 71 %   Neutro Abs 6.7 1.7 - 7.7 K/uL   Lymphocytes Relative 15 %   Lymphs Abs 1.4 0.7 - 4.0 K/uL   Monocytes Relative 14 %   Monocytes Absolute 1.4 (H) 0.1 - 1.0 K/uL   Eosinophils Relative 0 %   Eosinophils Absolute 0.0 0.0 - 0.7 K/uL   Basophils Relative 0 %   Basophils Absolute 0.0 0.0 - 0.1 K/uL    Comment: Performed at [facility], [place] [address], [place], [place] [address]  Basic metabolic panel     Status: Abnormal   Collection Time: 10/18/17  2:12 AM  Result Value Ref Range   Sodium 137 135 - 145 mmol/L   Potassium 4.5 3.5 - 5.1 mmol/L   Chloride 103 101 - 111 mmol/L   CO2 23 22 - 32 mmol/L   Glucose, Bld 122 (H) 65 - 99 mg/dL   BUN 21 (H) 6 - 20 mg/dL   Creatinine,  Ser 1.25 (H) 0.61 - 1.24 mg/dL   Calcium 9.0 8.9 - 10.3 mg/dL   GFR calc non Af Amer 52 (L) >60 mL/min   GFR calc Af Amer >60 >60 mL/min    Comment: (NOTE) The eGFR has been calculated using the CKD EPI equation. This calculation has not been validated in all clinical situations. eGFR's persistently <60 mL/min signify possible Chronic Kidney Disease.    Anion gap 11 5 - 15    Comment: Performed at [facility], [place] [address], [place], [place] [address]  Brain natriuretic peptide     Status: None   Collection Time: 10/18/17  2:12 AM  Result Value Ref Range   B Natriuretic Peptide 34.2 0.0 - 100.0 pg/mL    Comment: Performed at [facility], [place] [address] [place], [place] [address]  Influenza panel by PCR (type A & B)     Status: None   Collection Time: 10/18/17  6:51 AM  Result Value Ref Range   Influenza A By PCR NEGATIVE NEGATIVE   Influenza B By PCR NEGATIVE NEGATIVE    Comment: (NOTE) The Xpert Xpress Flu assay is intended as an aid in the diagnosis of  influenza and should not be used as a sole basis for treatment.  This  assay is FDA approved for nasopharyngeal swab specimens only. Nasal  washings and aspirates are unacceptable for Xpert Xpress Flu testing. Performed at [facility], [place] [address], [place], [place] [address]   I-Stat Troponin, ED (not at [facility])     Status: None   Collection Time: 10/18/17  8:18 AM  Result Value Ref Range   Troponin i, poc 0.01 0.00 - 0.08 ng/mL   Comment 3            Comment: Due to the release kinetics of cTnI, a negative result within the first hours of the onset of symptoms does not rule out myocardial infarction with certainty. If myocardial infarction is still suspected, repeat the test at appropriate intervals.    Dg Chest 2 View  Result Date: 10/18/2017 CLINICAL DATA:  Cough, congestion and fever. EXAM: CHEST - 2 VIEW COMPARISON:  10/12/2011 FINDINGS: Mild cardiomegaly  with normal mediastinal contours. Mild bibasilar atelectasis. Pulmonary vasculature is normal. No consolidation, pleural effusion, or pneumothorax. No acute osseous abnormalities are seen. IMPRESSION: 1. Mild bibasilar atelectasis.  No confluent airspace disease. 2. Mild cardiomegaly. Electronically Signed   By: Jeb Levering M.D.   On: 10/18/2017 05:55   Ct Angio Chest Pe W And/or Wo Contrast  Result Date: 10/18/2017 CLINICAL DATA:  Shortness of Breath EXAM: CT ANGIOGRAPHY CHEST WITH CONTRAST TECHNIQUE: Multidetector CT imaging of the chest was performed using the standard protocol during bolus administration of intravenous contrast. Multiplanar CT image reconstructions and MIPs were obtained to evaluate the vascular anatomy. CONTRAST:  128m ISOVUE-370 IOPAMIDOL (ISOVUE-370) INJECTION 76% COMPARISON:  Chest radiograph October 18, 2017 FINDINGS: Cardiovascular: There is no appreciable pulmonary embolus. There is no appreciable thoracic aortic aneurysm or dissection. There is atherosclerotic calcification in portions of the left subclavian artery. There is aortic atherosclerosis. There are foci of coronary artery calcification. There is no appreciable pleural effusion or pleural thickening. Mediastinum/Nodes: Thyroid appears normal. There are subcentimeter mediastinal lymph nodes. There is a small hiatal hernia. Lungs/Pleura: There is focal airspace consolidation consistent with pneumonia in the periphery of the superior segment right lower lobe. There is minimal infiltrate in the lateral segment right middle lobe. There is mild bibasilar atelectasis. No pleural effusion or pleural thickening evident. Upper Abdomen: Visualized upper abdominal structures appear normal. Musculoskeletal: There is degenerative change in the thoracic spine. There are no blastic or lytic bone lesions. Review of the MIP images confirms the above findings. IMPRESSION: 1. No demonstrable pulmonary embolus. No thoracic aortic aneurysm or  dissection. 2. Focal airspace consolidation in the periphery of the superior segment right lower lobe. This consolidation is felt to represent pneumonia. Minimal focus of pneumonia also noted in the periphery of the lateral segment of the right middle lobe. 3.  No evident thoracic adenopathy. 4. Foci of aortic atherosclerosis as well as coronary artery and great vessel calcification. 5.  Small hiatal hernia. Aortic Atherosclerosis (ICD10-I70.0). Electronically Signed   By: WLowella GripIII M.D.   On: 10/18/2017 09:15    Pending Labs Unresulted Labs (From admission, onward)   Start     Ordered   10/19/17 0500  Comprehensive metabolic panel  Tomorrow morning,   R     10/18/17 1051   10/18/17 1051  Respiratory Panel by PCR  (Respiratory virus panel)  Once,   R     10/18/17 1051   10/18/17 1049  Culture, blood (routine x 2) Call MD if unable to obtain prior to antibiotics being given  BLOOD CULTURE X 2,   R    Comments:  If blood cultures drawn in Emergency Department - Do not draw and cancel order    10/18/17 1051   10/18/17 1049  Culture, sputum-assessment  Once,   R     10/18/17 1051   10/18/17 1049  Gram stain  Once,   R     10/18/17 1051   10/18/17 1049  Strep pneumoniae urinary antigen  Once,   R     10/18/17 1051   10/18/17 1049  CBC  (heparin)  Once,   R    Comments:  Baseline for heparin therapy IF NOT ALREADY DRAWN.  Notify MD if PLT < 100 K.    10/18/17 1051   10/18/17 1049  Creatinine, serum  (heparin)  Once,   R    Comments:  Baseline for heparin therapy IF NOT ALREADY DRAWN.    10/18/17 1051  Vitals/Pain Today's Vitals   10/18/17 0845 10/18/17 0900 10/18/17 0922 10/18/17 0926  BP:   131/75 139/69  Pulse: (!) 113 (!) 103 (!) 101 (!) 103  Resp: 17 18 (!) 27 (!) 23  Temp:      TempSrc:      SpO2: 93% 96% 93% 95%    Isolation Precautions Droplet precaution  Medications Medications  sodium chloride 0.9 % injection (not administered)  iopamidol  (ISOVUE-370) 76 % injection (not administered)  multivitamins ther. w/minerals tablet 1 tablet (not administered)  aspirin EC tablet 81 mg (not administered)  Co Q 10 CAPS 200 mg (not administered)  Omega 3 CAPS (not administered)  Magnesium TABS 500 mg (not administered)  sildenafil (REVATIO) tablet 40 mg (not administered)  nitroGLYCERIN (NITROSTAT) SL tablet 0.4 mg (not administered)  ezetimibe (ZETIA) tablet 10 mg (not administered)  lisinopril (PRINIVIL,ZESTRIL) tablet 20 mg (not administered)  clopidogrel (PLAVIX) tablet 75 mg (not administered)  fluticasone (FLONASE) 50 MCG/ACT nasal spray 1 spray (not administered)  loratadine (CLARITIN) tablet 10 mg (not administered)  vitamin B-12 (CYANOCOBALAMIN) tablet 1,000 mcg (not administered)  0.9 %  sodium chloride infusion (not administered)  metoprolol succinate (TOPROL-XL) 24 hr tablet 25 mg (not administered)  amLODipine (NORVASC) tablet 5 mg (not administered)  gabapentin (NEURONTIN) tablet 800 mg (not administered)  atorvastatin (LIPITOR) tablet 10 mg (not administered)  zolpidem (AMBIEN) tablet 5 mg (not administered)  benzonatate (TESSALON) capsule 100 mg (not administered)  heparin injection 5,000 Units (not administered)  0.9 %  sodium chloride infusion (not administered)  cefTRIAXone (ROCEPHIN) 1 g in sodium chloride 0.9 % 100 mL IVPB (not administered)  ipratropium-albuterol (DUONEB) 0.5-2.5 (3) MG/3ML nebulizer solution 3 mL (not administered)  ipratropium-albuterol (DUONEB) 0.5-2.5 (3) MG/3ML nebulizer solution 3 mL (not administered)  doxycycline (VIBRA-TABS) tablet 100 mg (not administered)  ipratropium-albuterol (DUONEB) 0.5-2.5 (3) MG/3ML nebulizer solution 3 mL (3 mLs Nebulization Given 10/18/17 0657)  iopamidol (ISOVUE-370) 76 % injection 100 mL (100 mLs Intravenous Contrast Given 10/18/17 0829)  cefTRIAXone (ROCEPHIN) 1 g in sodium chloride 0.9 % 100 mL IVPB (0 g Intravenous Stopped 10/18/17 1035)  azithromycin  (ZITHROMAX) 500 mg in sodium chloride 0.9 % 250 mL IVPB (0 mg Intravenous Stopped 10/18/17 1054)  sodium chloride 0.9 % bolus 1,000 mL (0 mLs Intravenous Stopped 10/18/17 1054)    Mobility walks

## 2017-10-19 ENCOUNTER — Other Ambulatory Visit: Payer: Self-pay | Admitting: Cardiovascular Disease

## 2017-10-19 DIAGNOSIS — G4733 Obstructive sleep apnea (adult) (pediatric): Secondary | ICD-10-CM | POA: Diagnosis not present

## 2017-10-19 DIAGNOSIS — Z888 Allergy status to other drugs, medicaments and biological substances status: Secondary | ICD-10-CM | POA: Diagnosis not present

## 2017-10-19 DIAGNOSIS — K219 Gastro-esophageal reflux disease without esophagitis: Secondary | ICD-10-CM | POA: Diagnosis present

## 2017-10-19 DIAGNOSIS — J181 Lobar pneumonia, unspecified organism: Secondary | ICD-10-CM | POA: Diagnosis not present

## 2017-10-19 DIAGNOSIS — I10 Essential (primary) hypertension: Secondary | ICD-10-CM | POA: Diagnosis not present

## 2017-10-19 DIAGNOSIS — Z86010 Personal history of colonic polyps: Secondary | ICD-10-CM | POA: Diagnosis not present

## 2017-10-19 DIAGNOSIS — J9601 Acute respiratory failure with hypoxia: Secondary | ICD-10-CM | POA: Diagnosis present

## 2017-10-19 DIAGNOSIS — Z9841 Cataract extraction status, right eye: Secondary | ICD-10-CM | POA: Diagnosis not present

## 2017-10-19 DIAGNOSIS — I2510 Atherosclerotic heart disease of native coronary artery without angina pectoris: Secondary | ICD-10-CM | POA: Diagnosis not present

## 2017-10-19 DIAGNOSIS — G4700 Insomnia, unspecified: Secondary | ICD-10-CM | POA: Diagnosis present

## 2017-10-19 DIAGNOSIS — Z9049 Acquired absence of other specified parts of digestive tract: Secondary | ICD-10-CM | POA: Diagnosis not present

## 2017-10-19 DIAGNOSIS — Z7902 Long term (current) use of antithrombotics/antiplatelets: Secondary | ICD-10-CM | POA: Diagnosis not present

## 2017-10-19 DIAGNOSIS — Z9989 Dependence on other enabling machines and devices: Secondary | ICD-10-CM | POA: Diagnosis not present

## 2017-10-19 DIAGNOSIS — I2583 Coronary atherosclerosis due to lipid rich plaque: Secondary | ICD-10-CM | POA: Diagnosis not present

## 2017-10-19 DIAGNOSIS — I252 Old myocardial infarction: Secondary | ICD-10-CM | POA: Diagnosis not present

## 2017-10-19 DIAGNOSIS — R0602 Shortness of breath: Secondary | ICD-10-CM | POA: Diagnosis not present

## 2017-10-19 DIAGNOSIS — I359 Nonrheumatic aortic valve disorder, unspecified: Secondary | ICD-10-CM | POA: Diagnosis present

## 2017-10-19 DIAGNOSIS — Z87891 Personal history of nicotine dependence: Secondary | ICD-10-CM | POA: Diagnosis not present

## 2017-10-19 DIAGNOSIS — Z87442 Personal history of urinary calculi: Secondary | ICD-10-CM | POA: Diagnosis not present

## 2017-10-19 DIAGNOSIS — Z955 Presence of coronary angioplasty implant and graft: Secondary | ICD-10-CM | POA: Diagnosis not present

## 2017-10-19 DIAGNOSIS — G629 Polyneuropathy, unspecified: Secondary | ICD-10-CM | POA: Diagnosis present

## 2017-10-19 DIAGNOSIS — I255 Ischemic cardiomyopathy: Secondary | ICD-10-CM | POA: Diagnosis present

## 2017-10-19 DIAGNOSIS — G5600 Carpal tunnel syndrome, unspecified upper limb: Secondary | ICD-10-CM | POA: Diagnosis present

## 2017-10-19 DIAGNOSIS — I4510 Unspecified right bundle-branch block: Secondary | ICD-10-CM | POA: Diagnosis present

## 2017-10-19 DIAGNOSIS — E785 Hyperlipidemia, unspecified: Secondary | ICD-10-CM | POA: Diagnosis present

## 2017-10-19 DIAGNOSIS — Z9842 Cataract extraction status, left eye: Secondary | ICD-10-CM | POA: Diagnosis not present

## 2017-10-19 DIAGNOSIS — I440 Atrioventricular block, first degree: Secondary | ICD-10-CM | POA: Diagnosis present

## 2017-10-19 LAB — COMPREHENSIVE METABOLIC PANEL
ALT: 27 U/L (ref 17–63)
ANION GAP: 10 (ref 5–15)
AST: 47 U/L — ABNORMAL HIGH (ref 15–41)
Albumin: 3.4 g/dL — ABNORMAL LOW (ref 3.5–5.0)
Alkaline Phosphatase: 40 U/L (ref 38–126)
BUN: 19 mg/dL (ref 6–20)
CHLORIDE: 102 mmol/L (ref 101–111)
CO2: 23 mmol/L (ref 22–32)
CREATININE: 1.16 mg/dL (ref 0.61–1.24)
Calcium: 8.2 mg/dL — ABNORMAL LOW (ref 8.9–10.3)
GFR, EST NON AFRICAN AMERICAN: 57 mL/min — AB (ref >60)
Glucose, Bld: 132 mg/dL — ABNORMAL HIGH (ref 65–99)
POTASSIUM: 3.8 mmol/L (ref 3.5–5.1)
SODIUM: 135 mmol/L (ref 135–145)
Total Bilirubin: 0.9 mg/dL (ref 0.3–1.2)
Total Protein: 6.6 g/dL (ref 6.5–8.1)

## 2017-10-19 LAB — STREP PNEUMONIAE URINARY ANTIGEN: Strep Pneumo Urinary Antigen: NEGATIVE

## 2017-10-19 MED ORDER — GUAIFENESIN ER 600 MG PO TB12
600.0000 mg | ORAL_TABLET | Freq: Two times a day (BID) | ORAL | Status: DC | PRN
  Administered 2017-10-19 – 2017-10-20 (×2): 600 mg via ORAL
  Filled 2017-10-19 (×2): qty 1

## 2017-10-19 MED ORDER — ACETAMINOPHEN 325 MG PO TABS
650.0000 mg | ORAL_TABLET | Freq: Four times a day (QID) | ORAL | Status: DC | PRN
  Administered 2017-10-19: 650 mg via ORAL
  Filled 2017-10-19: qty 2

## 2017-10-19 NOTE — Progress Notes (Signed)
[facility] PROGRESS NOTE  [name] OMV:[id number] DOB: 1934/09/14 DOA: 10/18/2017  PCP: Pleas Koch, NP  Brief History/Interval Summary: [name] with a past medical history of ischemic cardiomyopathy, coronary artery disease with multiple stents, essential hypertension, obstructive sleep apnea presented with complains of shortness of breath.  He had returned from [place] recently.  Patient underwent CT angiogram of the chest which was negative for PE but did show right lower lobe pneumonia.  He was noted to be hypoxic.  He was hospitalized for further management.  Reason for Visit: Community-acquired pneumonia  Consultants: None  Procedures: None  Antibiotics: Ceftriaxone and doxycycline  Subjective/Interval History: Patient states that he is feeling better this morning.  Breathing has improved.  Continues to have some cough with clear expectoration today.  Denies any chest pain.  ROS: Denies any nausea or vomiting.  Objective:  Vital Signs  Vitals:   10/18/17 2042 10/19/17 0433 10/19/17 0828 10/19/17 0833  BP: 137/62 129/73 121/67   Pulse: 94 76 82   Resp: 16 14 18    Temp: 99 F (37.2 C) 98.7 F (37.1 C) 97.9 F (36.6 C)   TempSrc: Oral Oral Oral   SpO2: 95% 90% 90% (!) 89%  Weight:      Height:        Intake/Output Summary (Last 24 hours) at 10/19/2017 1244 Last data filed at 10/19/2017 6283 Gross per 24 hour  Intake 1302.5 ml  Output -  Net 1302.5 ml   Filed Weights   10/18/17 1237  Weight: 95.1 kg (209 lb 10.5 oz)    General appearance: alert, cooperative, appears stated age and no distress Resp: Rhonchi heard bilateral bases right more than left.  Few crackles heard as well.  Normal effort at rest. Cardio: regular rate and rhythm, S1, S2 normal, no murmur, click, rub or gallop GI: soft, non-tender; bowel sounds normal; no masses,  no organomegaly Extremities: extremities normal, atraumatic, no cyanosis or  edema Neurologic: No obvious focal neurological deficits.  Lab Results:  Data Reviewed: I have personally reviewed following labs and imaging studies  CBC: Recent Labs  Lab 10/18/17 0212 10/18/17 1555  WBC 9.5 11.6*  NEUTROABS 6.7  --   HGB 13.3 13.5  HCT 38.6* 38.6*  MCV 102.4* 101.8*  PLT 190 662    Basic Metabolic Panel: Recent Labs  Lab 10/18/17 0212 10/18/17 1414 10/19/17 0428  NA 137  --  135  K 4.5  --  3.8  CL 103  --  102  CO2 23  --  23  GLUCOSE 122*  --  132*  BUN 21*  --  19  CREATININE 1.25* 1.22 1.16  CALCIUM 9.0  --  8.2*    GFR: Estimated Creatinine Clearance: 59.8 mL/min (by C-G formula based on SCr of 1.16 mg/dL).  Liver Function Tests: Recent Labs  Lab 10/19/17 0428  AST 47*  ALT 27  ALKPHOS 40  BILITOT 0.9  PROT 6.6  ALBUMIN 3.4*     Radiology Studies: Dg Chest 2 View  Result Date: 10/18/2017 CLINICAL DATA:  Cough, congestion and fever. EXAM: CHEST - 2 VIEW COMPARISON:  10/12/2011 FINDINGS: Mild cardiomegaly with normal mediastinal contours. Mild bibasilar atelectasis. Pulmonary vasculature is normal. No consolidation, pleural effusion, or pneumothorax. No acute osseous abnormalities are seen. IMPRESSION: 1. Mild bibasilar atelectasis.  No confluent airspace disease. 2. Mild cardiomegaly. Electronically Signed   By: Jeb Levering M.D.   On: 10/18/2017 05:55   Ct Angio Chest Pe  W And/or Wo Contrast  Result Date: 10/18/2017 CLINICAL DATA:  Shortness of Breath EXAM: CT ANGIOGRAPHY CHEST WITH CONTRAST TECHNIQUE: Multidetector CT imaging of the chest was performed using the standard protocol during bolus administration of intravenous contrast. Multiplanar CT image reconstructions and MIPs were obtained to evaluate the vascular anatomy. CONTRAST:  126mL ISOVUE-370 IOPAMIDOL (ISOVUE-370) INJECTION 76% COMPARISON:  Chest radiograph October 18, 2017 FINDINGS: Cardiovascular: There is no appreciable pulmonary embolus. There is no appreciable  thoracic aortic aneurysm or dissection. There is atherosclerotic calcification in portions of the left subclavian artery. There is aortic atherosclerosis. There are foci of coronary artery calcification. There is no appreciable pleural effusion or pleural thickening. Mediastinum/Nodes: Thyroid appears normal. There are subcentimeter mediastinal lymph nodes. There is a small hiatal hernia. Lungs/Pleura: There is focal airspace consolidation consistent with pneumonia in the periphery of the superior segment right lower lobe. There is minimal infiltrate in the lateral segment right middle lobe. There is mild bibasilar atelectasis. No pleural effusion or pleural thickening evident. Upper Abdomen: Visualized upper abdominal structures appear normal. Musculoskeletal: There is degenerative change in the thoracic spine. There are no blastic or lytic bone lesions. Review of the MIP images confirms the above findings. IMPRESSION: 1. No demonstrable pulmonary embolus. No thoracic aortic aneurysm or dissection. 2. Focal airspace consolidation in the periphery of the superior segment right lower lobe. This consolidation is felt to represent pneumonia. Minimal focus of pneumonia also noted in the periphery of the lateral segment of the right middle lobe. 3.  No evident thoracic adenopathy. 4. Foci of aortic atherosclerosis as well as coronary artery and great vessel calcification. 5.  Small hiatal hernia. Aortic Atherosclerosis (ICD10-I70.0). Electronically Signed   By: Lowella Grip III M.D.   On: 10/18/2017 09:15     Medications:  Scheduled: . amLODipine  5 mg Oral Daily  . aspirin EC  81 mg Oral Daily  . atorvastatin  10 mg Oral Daily  . benzonatate  100 mg Oral Q8H  . clopidogrel  75 mg Oral Daily  . doxycycline  100 mg Oral Q12H  . ezetimibe  10 mg Oral Daily  . fluticasone  1 spray Each Nare Daily  . gabapentin  800 mg Oral TID  . heparin  5,000 Units Subcutaneous Q8H  . ipratropium-albuterol  3 mL  Nebulization QID  . lisinopril  20 mg Oral Daily  . loratadine  10 mg Oral Daily  . magnesium oxide  400 mg Oral Daily  . metoprolol succinate  25 mg Oral Daily  . multivitamin with minerals  1 tablet Oral Daily  . omega-3 acid ethyl esters  1 capsule Oral Daily  . cyanocobalamin  1,000 mcg Oral Daily   Continuous: . cefTRIAXone (ROCEPHIN)  IV Stopped (10/19/17 1130)   HDQ:[id number], ipratropium-albuterol, nitroGLYCERIN, zolpidem  Assessment/Plan:  Principal Problem:   CAP (community acquired pneumonia) Active Problems:   Hypercholesterolemia   OSA on CPAP   CAD (coronary artery disease)   Hypertension   Ischemic cardiomyopathy   GERD (gastroesophageal reflux disease)   Hypoxia    Acute respiratory failure with hypoxia Saturations were in the mid 80s.  Secondary to pneumonia.  Seems to have improved.  Continue to monitor for now.  Right lower lobe community-acquired pneumonia Seems to be improving.  He was placed on ceftriaxone and doxycycline.  He will be ambulated.  CT chest was negative for PE.  Influenza PCR negative.  Respiratory viral panel is pending.  History of coronary artery disease with ischemic cardiomyopathy  Appears to be euvolemic.  Continue home medications.  No ischemic symptoms at this time.  History of essential hypertension Continue home medications.  Blood pressure is reasonably well controlled.  Obstructive sleep apnea CPAP at bedtime.  DVT Prophylaxis: Subcutaneous heparin    Code Status: Full code Family Communication: Discussed with the patient Disposition Plan: Management as outlined above.  Ambulate today and check room air saturations.    LOS: 0 days   [facility] Pager [phone] 10/19/2017, 12:44 PM  If 7PM-7AM, please contact night-coverage at [email or web address], password [facility]

## 2017-10-20 ENCOUNTER — Other Ambulatory Visit: Payer: Self-pay | Admitting: Cardiovascular Disease

## 2017-10-20 LAB — BASIC METABOLIC PANEL
ANION GAP: 9 (ref 5–15)
BUN: 20 mg/dL (ref 6–20)
CALCIUM: 8.4 mg/dL — AB (ref 8.9–10.3)
CO2: 24 mmol/L (ref 22–32)
Chloride: 106 mmol/L (ref 101–111)
Creatinine, Ser: 1.17 mg/dL (ref 0.61–1.24)
GFR, EST NON AFRICAN AMERICAN: 57 mL/min — AB (ref >60)
GLUCOSE: 114 mg/dL — AB (ref 65–99)
POTASSIUM: 3.8 mmol/L (ref 3.5–5.1)
Sodium: 139 mmol/L (ref 135–145)

## 2017-10-20 LAB — CBC
HEMATOCRIT: 34.3 % — AB (ref 39.0–52.0)
Hemoglobin: 11.9 g/dL — ABNORMAL LOW (ref 13.0–17.0)
MCH: 35 pg — ABNORMAL HIGH (ref 26.0–34.0)
MCHC: 34.7 g/dL (ref 30.0–36.0)
MCV: 100.9 fL — AB (ref 78.0–100.0)
Platelets: 206 10*3/uL (ref 150–400)
RBC: 3.4 MIL/uL — AB (ref 4.22–5.81)
RDW: 12.8 % (ref 11.5–15.5)
WBC: 7.1 10*3/uL (ref 4.0–10.5)

## 2017-10-20 MED ORDER — CEFPODOXIME PROXETIL 200 MG PO TABS
200.0000 mg | ORAL_TABLET | Freq: Two times a day (BID) | ORAL | Status: DC
  Administered 2017-10-20: 200 mg via ORAL
  Filled 2017-10-20: qty 1

## 2017-10-20 MED ORDER — IPRATROPIUM-ALBUTEROL 0.5-2.5 (3) MG/3ML IN SOLN: 3.0000 mL | Freq: Three times a day (TID) | RESPIRATORY_TRACT | Status: DC

## 2017-10-20 MED ORDER — DOXYCYCLINE HYCLATE 100 MG PO TABS: 100.0000 mg | ORAL_TABLET | Freq: Two times a day (BID) | ORAL | 0 refills | Status: AC

## 2017-10-20 MED ORDER — CEFPODOXIME PROXETIL 200 MG PO TABS: 200.0000 mg | ORAL_TABLET | Freq: Two times a day (BID) | ORAL | 0 refills | Status: AC

## 2017-10-20 MED ORDER — ALBUTEROL SULFATE HFA 108 (90 BASE) MCG/ACT IN AERS: 2.0000 | INHALATION_SPRAY | Freq: Four times a day (QID) | RESPIRATORY_TRACT | 2 refills | Status: DC | PRN

## 2017-10-20 MED ORDER — GUAIFENESIN ER 600 MG PO TB12: 600.0000 mg | ORAL_TABLET | Freq: Two times a day (BID) | ORAL | 0 refills | Status: DC | PRN

## 2017-10-20 NOTE — Discharge Summary (Signed)
[facility]  Physician Discharge Summary   Patient ID: Gabriel [name] MRN: [id number] DOB/AGE: 81-Jul-1936 81 y.o.  Admit date: 10/18/2017 Discharge date: 10/20/2017  PCP: Pleas Koch, NP  DISCHARGE DIAGNOSES:  Principal Problem:   CAP (community acquired pneumonia) Active Problems:   Hypercholesterolemia   OSA on CPAP   CAD (coronary artery disease)   Hypertension   Ischemic cardiomyopathy   GERD (gastroesophageal reflux disease)   Hypoxia   RECOMMENDATIONS FOR OUTPATIENT FOLLOW UP: 1. Patient instructed to follow-up with his PCP in 1 week   DISCHARGE CONDITION: fair  Diet recommendation: Heart healthy  Filed Weights   10/18/17 1237  Weight: 95.1 kg (209 lb 10.5 oz)    INITIAL HISTORY: [name] with a past medical history of ischemic cardiomyopathy, coronary artery disease with multiple stents, essential hypertension, obstructive sleep apnea presented with complains of shortness of breath.  He had returned from [place] recently.  Patient underwent CT angiogram of the chest which was negative for PE but did show right lower lobe pneumonia.  He was noted to be hypoxic.  He was hospitalized for further management.   HOSPITAL COURSE:   Acute respiratory failure with hypoxia Saturations were in the mid 80s initially.  This was secondary to the pneumonia.  Patient was admitted to the hospital and placed on antibiotics.  He slowly improved.  Now he is saturating normal on room air.  Right lower lobe community-acquired pneumonia He was placed on ceftriaxone and doxycycline.  He improved.  Saturating normal on room air.  Ambulated.  Feels much better today.  Continues to have some cough.  Will be discharged on oral Vantin and doxycycline. CT chest was negative for PE.  Influenza PCR negative.   History of coronary artery disease with ischemic cardiomyopathy Appears to be euvolemic.  Continue home medications.  No ischemic symptoms at this  time.  History of essential hypertension Continue home medications.  Blood pressure is reasonably well controlled.  Obstructive sleep apnea CPAP at bedtime.  Overall stable.  Okay for discharge home today.    PERTINENT LABS:  The results of significant diagnostics from this hospitalization (including imaging, microbiology, ancillary and laboratory) are listed below for reference.    Microbiology: Recent Results (from the past 240 hour(s))  Culture, blood (routine x 2) Call MD if unable to obtain prior to antibiotics being given     Status: None (Preliminary result)   Collection Time: 10/18/17  1:34 PM  Result Value Ref Range Status   Specimen Description   Final    BLOOD LEFT HAND Performed at [facility], [place] [address], [place], [place] [address]    Special Requests   Final    AEROBIC BOTTLE ONLY Blood Culture results may not be optimal due to an inadequate volume of blood received in culture bottles Performed at [facility] [address], [place], [place] [address]    Culture   Final    NO GROWTH 2 DAYS Performed at [facility] [address], [place], [place] [address]    Report Status PENDING  Incomplete  Culture, blood (routine x 2) Call MD if unable to obtain prior to antibiotics being given     Status: None (Preliminary result)   Collection Time: 10/18/17  2:14 PM  Result Value Ref Range Status   Specimen Description   Final    BLOOD LEFT ARM Performed at [facility] [address], [place], [place] [address]  Special Requests   Final    BOTTLES DRAWN AEROBIC ONLY Blood Culture adequate volume Performed at [facility] [address], [place], [place] [address]    Culture   Final    NO GROWTH 2 DAYS Performed at [facility] [address], [place], [place] [address]    Report Status PENDING  Incomplete     Labs: Basic Metabolic Panel: Recent Labs  Lab  10/18/17 0212 10/18/17 1414 10/19/17 0428 10/20/17 0423  NA 137  --  135 139  K 4.5  --  3.8 3.8  CL 103  --  102 106  CO2 23  --  23 24  GLUCOSE 122*  --  132* 114*  BUN 21*  --  19 20  CREATININE 1.25* 1.22 1.16 1.17  CALCIUM 9.0  --  8.2* 8.4*   Liver Function Tests: Recent Labs  Lab 10/19/17 0428  AST 47*  ALT 27  ALKPHOS 40  BILITOT 0.9  PROT 6.6  ALBUMIN 3.4*   CBC: Recent Labs  Lab 10/18/17 0212 10/18/17 1555 10/20/17 0423  WBC 9.5 11.6* 7.1  NEUTROABS 6.7  --   --   HGB 13.3 13.5 11.9*  HCT 38.6* 38.6* 34.3*  MCV 102.4* 101.8* 100.9*  PLT 190 197 206   BNP: BNP (last 3 results) Recent Labs    10/18/17 0212  BNP 34.2     IMAGING STUDIES Dg Chest 2 View  Result Date: 10/18/2017 CLINICAL DATA:  Cough, congestion and fever. EXAM: CHEST - 2 VIEW COMPARISON:  10/12/2011 FINDINGS: Mild cardiomegaly with normal mediastinal contours. Mild bibasilar atelectasis. Pulmonary vasculature is normal. No consolidation, pleural effusion, or pneumothorax. No acute osseous abnormalities are seen. IMPRESSION: 1. Mild bibasilar atelectasis.  No confluent airspace disease. 2. Mild cardiomegaly. Electronically Signed   By: Jeb Levering M.D.   On: 10/18/2017 05:55   Ct Angio Chest Pe W And/or Wo Contrast  Result Date: 10/18/2017 CLINICAL DATA:  Shortness of Breath EXAM: CT ANGIOGRAPHY CHEST WITH CONTRAST TECHNIQUE: Multidetector CT imaging of the chest was performed using the standard protocol during bolus administration of intravenous contrast. Multiplanar CT image reconstructions and MIPs were obtained to evaluate the vascular anatomy. CONTRAST:  144mL ISOVUE-370 IOPAMIDOL (ISOVUE-370) INJECTION 76% COMPARISON:  Chest radiograph October 18, 2017 FINDINGS: Cardiovascular: There is no appreciable pulmonary embolus. There is no appreciable thoracic aortic aneurysm or dissection. There is atherosclerotic calcification in portions of the left subclavian artery. There is aortic  atherosclerosis. There are foci of coronary artery calcification. There is no appreciable pleural effusion or pleural thickening. Mediastinum/Nodes: Thyroid appears normal. There are subcentimeter mediastinal lymph nodes. There is a small hiatal hernia. Lungs/Pleura: There is focal airspace consolidation consistent with pneumonia in the periphery of the superior segment right lower lobe. There is minimal infiltrate in the lateral segment right middle lobe. There is mild bibasilar atelectasis. No pleural effusion or pleural thickening evident. Upper Abdomen: Visualized upper abdominal structures appear normal. Musculoskeletal: There is degenerative change in the thoracic spine. There are no blastic or lytic bone lesions. Review of the MIP images confirms the above findings. IMPRESSION: 1. No demonstrable pulmonary embolus. No thoracic aortic aneurysm or dissection. 2. Focal airspace consolidation in the periphery of the superior segment right lower lobe. This consolidation is felt to represent pneumonia. Minimal focus of pneumonia also noted in the periphery of the lateral segment of the right middle lobe. 3.  No evident thoracic adenopathy. 4. Foci of aortic atherosclerosis as well  as coronary artery and great vessel calcification. 5.  Small hiatal hernia. Aortic Atherosclerosis (ICD10-I70.0). Electronically Signed   By: Lowella Grip III M.D.   On: 10/18/2017 09:15    DISCHARGE EXAMINATION: Vitals:   10/19/17 2049 10/20/17 0443 10/20/17 0750 10/20/17 1041  BP: (!) 149/80 134/85  139/79  Pulse: 84 77  82  Resp: 14 12    Temp: 98 F (36.7 C) 98.2 F (36.8 C)    TempSrc: Oral Oral    SpO2: 93% 96% 92%   Weight:      Height:       General appearance: alert, cooperative, appears stated age and no distress Resp: Improved air entry bilaterally.  Few crackles and rhonchi heard.  Normal effort. Cardio: regular rate and rhythm, S1, S2 normal, no murmur, click, rub or gallop GI: soft, non-tender; bowel  sounds normal; no masses,  no organomegaly  DISPOSITION: Home  Discharge Instructions    Call MD for:  difficulty breathing, headache or visual disturbances   Complete by:  As directed    Call MD for:  extreme fatigue   Complete by:  As directed    Call MD for:  persistant dizziness or light-headedness   Complete by:  As directed    Call MD for:  persistant nausea and vomiting   Complete by:  As directed    Call MD for:  severe uncontrolled pain   Complete by:  As directed    Call MD for:  temperature >100.4   Complete by:  As directed    Diet - low sodium heart healthy   Complete by:  As directed    Discharge instructions   Complete by:  As directed    Please take all your medications as prescribed.  Follow-up with your primary care provider within 1 week.  You were cared for by a hospitalist during your hospital stay. If you have any questions about your discharge medications or the care you received while you were in the hospital after you are discharged, you can call the unit and asked to speak with the hospitalist on call if the hospitalist that took care of you is not available. Once you are discharged, your primary care physician will handle any further medical issues. Please note that NO REFILLS for any discharge medications will be authorized once you are discharged, as it is imperative that you return to your primary care physician (or establish a relationship with a primary care physician if you do not have one) for your aftercare needs so that they can reassess your need for medications and monitor your lab values. If you do not have a primary care physician, you can call [phone] for a physician referral.   Increase activity slowly   Complete by:  As directed         Allergies as of 10/20/2017      Reactions   Lipitor [atorvastatin Calcium] Other (See Comments)   Muscle pain--Pt stated he can tolerate a low dose   Mevacor [lovastatin] Other (See Comments)   "makes me  feel loopy"      Medication List    STOP taking these medications   doxycycline 100 MG capsule Commonly known as:  VIBRAMYCIN Replaced by:  doxycycline 100 MG tablet     TAKE these medications   albuterol 108 (90 Base) MCG/ACT inhaler Commonly known as:  PROVENTIL HFA;VENTOLIN HFA Inhale 2 puffs into the lungs every 6 (six) hours as needed for wheezing or shortness of breath.  amLODipine 5 MG tablet Commonly known as:  NORVASC TAKE 1 TABLET BY MOUTH DAILY.   aspirin EC 81 MG tablet Take 81 mg by mouth daily.   atorvastatin 10 MG tablet Commonly known as:  LIPITOR TAKE 1 TABLET BY MOUTH DAILY.   BENEFIBER PO Take 5 g by mouth daily.   benzonatate 100 MG capsule Commonly known as:  TESSALON Take 1 capsule (100 mg total) by mouth every 8 (eight) hours.   cefpodoxime 200 MG tablet Commonly known as:  VANTIN Take 1 tablet (200 mg total) by mouth every 12 (twelve) hours for 5 days.   cetirizine 10 MG tablet Commonly known as:  ZYRTEC Take 10 mg by mouth daily.   clopidogrel 75 MG tablet Commonly known as:  PLAVIX TAKE 1 TABLET BY MOUTH DAILY.   CO Q 10 PO Take 200 mg by mouth daily.   cyanocobalamin 1000 MCG tablet Take 1,000 mcg by mouth daily.   doxycycline 100 MG tablet Commonly known as:  VIBRA-TABS Take 1 tablet (100 mg total) by mouth every 12 (twelve) hours for 5 days. Replaces:  doxycycline 100 MG capsule   ezetimibe 10 MG tablet Commonly known as:  ZETIA TAKE 1 TABLET BY MOUTH DAILY.   fluticasone 50 MCG/ACT nasal spray Commonly known as:  FLONASE Place 1 spray into both nostrils daily.   gabapentin 800 MG tablet Commonly known as:  NEURONTIN TAKE 1 TABLET BY MOUTH 3 TIMES DAILY.   guaiFENesin 600 MG 12 hr tablet Commonly known as:  MUCINEX Take 1 tablet (600 mg total) by mouth 2 (two) times daily as needed for cough or to loosen phlegm.   lisinopril 20 MG tablet Commonly known as:  PRINIVIL,ZESTRIL TAKE 1 TABLET (20 MG TOTAL) BY MOUTH  DAILY.   Magnesium 500 MG Tabs Take 500 mg by mouth daily.   metoprolol succinate 25 MG 24 hr tablet Commonly known as:  TOPROL-XL TAKE 1 TABLET BY MOUTH DAILY.   multivitamins ther. w/minerals Tabs tablet Take 1 tablet by mouth daily.   nitroGLYCERIN 0.4 MG SL tablet Commonly known as:  NITROSTAT Place 1 tablet (0.4 mg total) under the tongue every 5 (five) minutes as needed for chest pain.   OMEGA 3 PO Take 1 tablet by mouth daily.   sildenafil 20 MG tablet Commonly known as:  REVATIO Take 2 tablets (40 mg total) by mouth daily as needed (erectile dysfunction).   zolpidem 10 MG tablet Commonly known as:  AMBIEN TAKE 1 TABLET BY MOUTH AT BEDTIME AS NEEDED FOR SLEEP        Follow-up Information    Pleas Koch, NP. Schedule an appointment as soon as possible for a visit in 1 week(s).   Specialty:  Internal Medicine Contact information: [address] [place] [address] [phone]           TOTAL DISCHARGE TIME: 35 mins  [facility] Pager [phone]  10/20/2017, 3:09 PM

## 2017-10-20 NOTE — Progress Notes (Signed)
Discharge instructions reviewed with patient and wife. Both verbalized understanding. Patient to be discharged via private vehicle with wife.

## 2017-10-20 NOTE — Discharge Instructions (Signed)

## 2017-10-23 LAB — CULTURE, BLOOD (ROUTINE X 2)
Culture: NO GROWTH
Culture: NO GROWTH
Special Requests: ADEQUATE

## 2017-10-24 ENCOUNTER — Telehealth: Payer: Self-pay

## 2017-10-24 NOTE — Telephone Encounter (Signed)
Left message to call office to follow-up on his recent hospital stay. Needed to do TCM call.

## 2017-10-24 NOTE — Telephone Encounter (Signed)
Transition Care Management Follow-up Telephone Call   Date discharged? 10-20-17   How have you been since you were released from the hospital? He is not running a fever anymore. Cough has gotten a little better. He is still on doxycycline and Tessalon Perles.    Do you understand why you were in the hospital? yes   Do you understand the discharge instructions? yes   Where were you discharged to? Home   Items Reviewed:  Medications reviewed: yes  Allergies reviewed: yes  Dietary changes reviewed: yes  Referrals reviewed: yes   Functional Questionnaire:   Activities of Daily Living (ADLs):   He states they are independent in the following: ambulation, bathing and hygiene, feeding, continence, grooming, toileting and dressing States they require assistance with the following: Pretty independent   Any transportation issues/concerns?: no   Any patient concerns? Yes, worried about how he sounds when he coughs.    Confirmed importance and date/time of follow-up visits scheduled yes  Provider Appointment booked with Allie Bossier 10-26-17  Confirmed with patient if condition begins to worsen call PCP or go to the ER.  Patient was given the office number and encouraged to call back with question or concerns.  : yes

## 2017-10-26 ENCOUNTER — Ambulatory Visit (INDEPENDENT_AMBULATORY_CARE_PROVIDER_SITE_OTHER): Payer: Medicare Other | Admitting: Primary Care

## 2017-10-26 ENCOUNTER — Encounter: Payer: Self-pay | Admitting: Primary Care

## 2017-10-26 VITALS — BP 118/66 | HR 82 | Temp 98.2°F | Ht 72.0 in | Wt 213.8 lb

## 2017-10-26 DIAGNOSIS — J181 Lobar pneumonia, unspecified organism: Secondary | ICD-10-CM

## 2017-10-26 DIAGNOSIS — I2510 Atherosclerotic heart disease of native coronary artery without angina pectoris: Secondary | ICD-10-CM

## 2017-10-26 DIAGNOSIS — J189 Pneumonia, unspecified organism: Secondary | ICD-10-CM

## 2017-10-26 MED ORDER — GUAIFENESIN-CODEINE 100-10 MG/5ML PO SYRP: 5.0000 mL | ORAL_SOLUTION | Freq: Every evening | ORAL | 0 refills | Status: DC | PRN

## 2017-10-26 NOTE — Patient Instructions (Signed)
You may continue to cough for a few weeks.  You may try the guaifenesin with codeine cough suppressant at bedtime as needed for cough and sleep. Do not take your Ambien with this medication.   Use the albuterol inhaler only as needed for shortness of breath, wheezing, cough.  Continue Doxycycline antibiotics as prescribed.  Please call me if you develop fevers, increased shortness of breath, worsening cough.   It was a pleasure to see you today!

## 2017-10-26 NOTE — Progress Notes (Signed)
Subjective:    Patient ID: Gabriel [name], male    DOB: March 06, 1935, 83 y.o.   MRN: [id number]  HPI  Gabriel [name] is an 83 year old male who presents today for hospital follow up.  He presented to [facility] on 10/15/17 with a three day history of sinus pressure, cough, fevers. He was diagnosed and treated for acute bacterial sinusitis with Doxycycline course.  He then presented to [facility] on 10/18/17 with reports of continued fevers, cough, congestion, sinus pressure, shortness of breath. During his stay in the ED he was noted to have room air oxygen saturation of 88%. His chest xray showed bilateral atelectasis, he had a negative influenza test. CBC, CMP, BNP unremarkable. His CT chest was negative for PE, positive for right lower lobe pneumonia. He was initiated on IV antibiotics and admitted for further management.  During his hospital stay he continued antibiotic treatment, also underwent nebulized breathing treatments. His symptoms improved and his room air saturation returned to normal. He was discharged home on 10/20/17 with prescriptions for Vantin and Doxycycline.   Since discharge home he's feeling somewhat better. He continues to cough which is worse at night and prevents him from sleeping. He's been compliant to his oral antibiotics and completed his Vantin. He's been taking benzonatate capsules, Mucinex, albuterol inhaler every 6 hours. He doesn't feel as though the benzonatate capsules are helping with cough. He denies fevers, increased shortness of breath.   Review of Systems  Constitutional: Positive for fatigue. Negative for fever.  HENT: Positive for congestion and sinus pressure. Negative for sore throat.   Respiratory: Positive for cough. Negative for wheezing.        Minimal shortness of breath  Cardiovascular: Negative for chest pain.       Past Medical History:  Diagnosis Date  . 1st degree AV block   . Aortic valve disease    a. Mild AS, AI by echo  01/2014.  Marland Kitchen CAD (coronary artery disease)    a. s/p PTCA 1995. b. MI in 2003 s/p stenting to LAD 2003, s/p stent to Cx 2003. c. prior rotablator to PTCA vessel. d. Abnl nuc 01/2014: s/p DES to LAD, mild-mod nonobstructive disease otherwise.  . Carpal tunnel syndrome   . GERD (gastroesophageal reflux disease)   . Hyperlipidemia    a. Can only tolerate low dose statins.  . Hypertension   . Insomnia   . Ischemic cardiomyopathy    a. EF 40-45% by echo 02/04/14, 40-45% by cath 02/05/14 (similar to 2010).  . Kidney stone   . Neuropathy   . OSA (obstructive sleep apnea)    with upper airway resistancy  . RBBB   . Small intestinal bacterial overgrowth   . Tubular adenoma of colon   . Vertigo    Had Epley maneuver, vestibular rehab.     Social History   Socioeconomic History  . Marital status: Married    Spouse name: [name]  . Number of children: 6  . Years of education: hs  . Highest education level: Not on file  Occupational History    Employer: RETIRED  Social Needs  . Financial resource strain: Not on file  . Food insecurity:    Worry: Not on file    Inability: Not on file  . Transportation needs:    Medical: Not on file    Non-medical: Not on file  Tobacco Use  . Smoking status: Former Research scientist (life sciences)  . Smokeless tobacco: Never Used  . Tobacco  comment: 1968  Substance and Sexual Activity  . Alcohol use: No    Alcohol/week: 0.0 oz    Comment: rarely  . Drug use: No  . Sexual activity: Yes  Lifestyle  . Physical activity:    Days per week: Not on file    Minutes per session: Not on file  . Stress: Not on file  Relationships  . Social connections:    Talks on phone: Not on file    Gets together: Not on file    Attends religious service: Not on file    Active member of club or organization: Not on file    Attends meetings of clubs or organizations: Not on file    Relationship status: Not on file  . Intimate partner violence:    Fear of current or ex partner: Not on file     Emotionally abused: Not on file    Physically abused: Not on file    Forced sexual activity: Not on file  Other Topics Concern  . Not on file  Social History Narrative   Patient is married [name]).   Patient has four biological children and two adopted children.   Patient is retired from Electronics engineer.   Patient has a high school education.   Patient drinks coffee (12oz) daily.   Patient is right-handed.   Enjoys traveling, working on projects.              Past Surgical History:  Procedure Laterality Date  . CARDIAC CATHETERIZATION  2003   Following MI  . CATARACT EXTRACTION, BILATERAL  2004  . CHOLECYSTECTOMY  2009  . CORONARY ANGIOPLASTY  2003   LAD  . CORONARY STENT PLACEMENT  2003   LCX  . HERNIA REPAIR  1994   double  . KIDNEY STONE SURGERY  10/2005  . KNEE SURGERY Left   . LEFT HEART CATH  02/05/2014  . LEFT HEART CATHETERIZATION WITH CORONARY ANGIOGRAM N/A 02/05/2014   Procedure: LEFT HEART CATHETERIZATION WITH CORONARY ANGIOGRAM;  Surgeon: Jettie Booze, MD;  Location: [facility] CATH LAB;  Service: Cardiovascular;  Laterality: N/A;  . LITHOTRIPSY  10/2005  . meniscus removal- knee Right   . PERCUTANEOUS CORONARY STENT INTERVENTION (PCI-S) Right 02/05/2014   Procedure: PERCUTANEOUS CORONARY STENT INTERVENTION (PCI-S);  Surgeon: Jettie Booze, MD;  Location[facility];  Service: Cardiovascular;  Laterality: Right;  . SHOULDER OPEN ROTATOR CUFF REPAIR  1990    Family History  Problem Relation Age of Onset  . Heart Problems Mother   . Diabetes Mother   . Parkinson's disease Father 49  . Other Sister        Muscular degeneration  . Heart attack Neg Hx   . Stroke Neg Hx     Allergies  Allergen Reactions  . Lipitor [Atorvastatin Calcium] Other (See Comments)    Muscle pain--Pt stated he can tolerate a low dose  . Mevacor [Lovastatin] Other (See Comments)    "makes me feel loopy"    Current Outpatient Medications on File Prior to  Visit  Medication Sig Dispense Refill  . albuterol (PROVENTIL HFA;VENTOLIN HFA) 108 (90 Base) MCG/ACT inhaler Inhale 2 puffs into the lungs every 6 (six) hours as needed for wheezing or shortness of breath. 1 Inhaler 2  . amLODipine (NORVASC) 5 MG tablet TAKE 1 TABLET BY MOUTH DAILY. 90 tablet 2  . aspirin EC 81 MG tablet Take 81 mg by mouth daily.    Marland Kitchen atorvastatin (LIPITOR) 10 MG tablet TAKE 1  TABLET BY MOUTH DAILY. 90 tablet 3  . cetirizine (ZYRTEC) 10 MG tablet Take 10 mg by mouth daily.    . clopidogrel (PLAVIX) 75 MG tablet TAKE 1 TABLET BY MOUTH DAILY. 90 tablet 3  . Coenzyme Q10 (CO Q 10 PO) Take 200 mg by mouth daily.     . cyanocobalamin 1000 MCG tablet Take 1,000 mcg by mouth daily.    Marland Kitchen ezetimibe (ZETIA) 10 MG tablet TAKE 1 TABLET BY MOUTH DAILY. 90 tablet 1  . fluticasone (FLONASE) 50 MCG/ACT nasal spray Place 1 spray into both nostrils daily.    Marland Kitchen gabapentin (NEURONTIN) 800 MG tablet TAKE 1 TABLET BY MOUTH 3 TIMES DAILY. 270 tablet 0  . guaiFENesin (MUCINEX) 600 MG 12 hr tablet Take 1 tablet (600 mg total) by mouth 2 (two) times daily as needed for cough or to loosen phlegm. 30 tablet 0  . lisinopril (PRINIVIL,ZESTRIL) 20 MG tablet TAKE 1 TABLET (20 MG TOTAL) BY MOUTH DAILY. 90 tablet 3  . Magnesium 500 MG TABS Take 500 mg by mouth daily.    . metoprolol succinate (TOPROL-XL) 25 MG 24 hr tablet TAKE 1 TABLET BY MOUTH DAILY. 90 tablet 1  . Multiple Vitamins-Minerals (MULTIVITAMINS THER. W/MINERALS) TABS Take 1 tablet by mouth daily.    . nitroGLYCERIN (NITROSTAT) 0.4 MG SL tablet Place 1 tablet (0.4 mg total) under the tongue every 5 (five) minutes as needed for chest pain. 30 tablet 1  . Omega-3 Fatty Acids (OMEGA 3 PO) Take 1 tablet by mouth daily.    . sildenafil (REVATIO) 20 MG tablet Take 2 tablets (40 mg total) by mouth daily as needed (erectile dysfunction). 50 tablet 1  . Wheat Dextrin (BENEFIBER PO) Take 5 g by mouth daily.    Marland Kitchen zolpidem (AMBIEN) 10 MG tablet TAKE 1 TABLET  BY MOUTH AT BEDTIME AS NEEDED FOR SLEEP 90 tablet 0   No current facility-administered medications on file prior to visit.     BP 118/66   Pulse 82   Temp 98.2 F (36.8 C) (Oral)   Ht 6' (1.829 m)   Wt 213 lb 12 oz (97 kg)   SpO2 95%   BMI 28.99 kg/m    Objective:   Physical Exam  Constitutional: He appears well-nourished.  Neck: Neck supple.  Cardiovascular: Normal rate and regular rhythm.  Murmur heard. Pulmonary/Chest: Effort normal and breath sounds normal. He has no wheezes. He has no rales.  Congested cough noted on exam  Skin: Skin is warm and dry.          Assessment & Plan:

## 2017-10-26 NOTE — Assessment & Plan Note (Signed)
Admitted to [facility] for CAP on 10/18/17. Doing better overall today except for lingering cough.   Discussed that cough may linger for several weeks. Rx for Cheratussin sent to pharmacy to use HS for rest, discussed to avoid Ambien with this medication.  Continue Doxycycline. Discussed to use Albuterol only as needed as he was not aware that this was just a PRN order. This may help with sleeping at night.   Discussed strict return precautions including fevers, worsening cough/SOB. Exam today stable. Hospital labs, notes, imaging reviewed.

## 2017-10-31 ENCOUNTER — Other Ambulatory Visit: Payer: Self-pay | Admitting: Neurology

## 2017-11-17 ENCOUNTER — Other Ambulatory Visit: Payer: Self-pay

## 2017-11-17 ENCOUNTER — Other Ambulatory Visit: Payer: Medicare Other | Admitting: *Deleted

## 2017-11-17 DIAGNOSIS — I2510 Atherosclerotic heart disease of native coronary artery without angina pectoris: Secondary | ICD-10-CM | POA: Diagnosis not present

## 2017-11-17 LAB — BASIC METABOLIC PANEL
BUN / CREAT RATIO: 13 (ref 10–24)
BUN: 15 mg/dL (ref 8–27)
CHLORIDE: 102 mmol/L (ref 96–106)
CO2: 23 mmol/L (ref 20–29)
Calcium: 8.8 mg/dL (ref 8.6–10.2)
Creatinine, Ser: 1.16 mg/dL (ref 0.76–1.27)
GFR calc non Af Amer: 59 mL/min/{1.73_m2} — ABNORMAL LOW (ref >59)
GFR, EST AFRICAN AMERICAN: 68 mL/min/{1.73_m2} (ref >59)
Glucose: 110 mg/dL — ABNORMAL HIGH (ref 65–99)
POTASSIUM: 4.3 mmol/L (ref 3.5–5.2)
Sodium: 138 mmol/L (ref 134–144)

## 2017-11-17 LAB — HEPATIC FUNCTION PANEL
ALT: 25 IU/L (ref 0–44)
AST: 25 IU/L (ref 0–40)
Albumin: 4 g/dL (ref 3.5–4.7)
Alkaline Phosphatase: 56 IU/L (ref 39–117)
Bilirubin Total: 0.7 mg/dL (ref 0.0–1.2)
Bilirubin, Direct: 0.23 mg/dL (ref 0.00–0.40)
Total Protein: 6.7 g/dL (ref 6.0–8.5)

## 2017-11-17 LAB — LIPID PANEL
CHOLESTEROL TOTAL: 153 mg/dL (ref 100–199)
Chol/HDL Ratio: 3.2 ratio (ref 0.0–5.0)
HDL: 48 mg/dL (ref >39)
LDL Calculated: 70 mg/dL (ref 0–99)
Triglycerides: 174 mg/dL — ABNORMAL HIGH (ref 0–149)
VLDL CHOLESTEROL CAL: 35 mg/dL (ref 5–40)

## 2017-11-22 ENCOUNTER — Other Ambulatory Visit: Payer: Self-pay | Admitting: Cardiovascular Disease

## 2017-11-23 ENCOUNTER — Encounter: Payer: Self-pay | Admitting: Nurse Practitioner

## 2017-11-23 ENCOUNTER — Encounter: Payer: Self-pay | Admitting: Cardiovascular Disease

## 2017-11-23 ENCOUNTER — Ambulatory Visit (INDEPENDENT_AMBULATORY_CARE_PROVIDER_SITE_OTHER): Payer: Medicare Other | Admitting: Cardiovascular Disease

## 2017-11-23 VITALS — BP 134/72 | HR 73 | Ht 72.0 in | Wt 213.4 lb

## 2017-11-23 DIAGNOSIS — I25119 Atherosclerotic heart disease of native coronary artery with unspecified angina pectoris: Secondary | ICD-10-CM | POA: Diagnosis not present

## 2017-11-23 DIAGNOSIS — I350 Nonrheumatic aortic (valve) stenosis: Secondary | ICD-10-CM

## 2017-11-23 NOTE — Progress Notes (Signed)
Cardiology Office Note   Date:  11/23/2017   ID:  Gabriel [name], DOB 21-Sep-1934, MRN [id number]  PCP:  Gabriel Koch, NP  Cardiologist:   Mertie Moores, MD , previous [place] [name]   Chief Complaint  [name] with  . Coronary Artery Disease   Problem list 1. Coronary artery disease 2. Mild systolic congestive heart failure due to coronary artery disease 3. Essential hypertension 4. Hyperlipidemia 5. Obstructive sleep apnea 6. Abdominal bloating      Gabriel [name] is a 83 y.o. male who [name] for his CAD  I preformed several PCIs in Gabriel 1995s - 2000  Had a recent PCI in July 2015.   ( mid LAD)   Not getting much regular exercise .    Cath in July 2015:  IMPRESSIONS:  1. Normal left main coronary artery. 2. 90% focal stenosis in Gabriel mid left anterior descending artery. This was successfully treated with a 2.25 x 12 promus drug-eluting stent, postdilated to 2.6 mm in diameter. Previously placed stent in Gabriel more proximal portion of Gabriel LAD is widely patent. 3. Patent stents in Gabriel mid left circumflex artery. Mild to moderate disease in Gabriel circumflex system. 4. Mild to moderate disease right coronary artery system. Gabriel distal RCA stent has mild in-stent restenosis. 5. Mildly decreased left ventricular systolic function. LVEDP 19 mmHg. Ejection fraction 40-45%.  Sept. 6, 2017:  Doing well.   Had left knee arthroscopy recently.  No complications. No CP , no dyspnea BP is well controlled   October 17, 2016:  Doing well His knee is feeling better  Recently went to [place] to spring training   Sept. 27, 2018:  Doing well Hx of stenting  No CP , no dyspnea.   Stays busy,  remodling Gabriel activity building at church.  Not as much aerobic exercise Needs a knee replacement.   Able to do some walking  Encouraged him to use an exercise   November 23, 2016:  Gabriel [name] was in Gabriel hospital for 4 days with pneumonia.  [name] still feels fairly fatigued.  His  breathing has improved. [name] reportssome upper left chest discomfort - pressures, some sharp  Worse with exertion , also had some early fatigue  And CP  while hanging blinds  At Gabriel [employer]  Associated with some lightheadedness.   Past Medical History:  Diagnosis Date  . 1st degree AV block   . Aortic valve disease    a. Mild AS, AI by echo 01/2014.  Marland Kitchen CAD (coronary artery disease)    a. s/p PTCA 1995. b. MI in 2003 s/p stenting to LAD 2003, s/p stent to Cx 2003. c. prior rotablator to PTCA vessel. d. Abnl nuc 01/2014: s/p DES to LAD, mild-mod nonobstructive disease otherwise.  . Carpal tunnel syndrome   . GERD (gastroesophageal reflux disease)   . Hyperlipidemia    a. Can only tolerate low dose statins.  . Hypertension   . Insomnia   . Ischemic cardiomyopathy    a. EF 40-45% by echo 02/04/14, 40-45% by cath 02/05/14 (similar to 2010).  . Kidney stone   . Neuropathy   . OSA (obstructive sleep apnea)    with upper airway resistancy  . RBBB   . Small intestinal bacterial overgrowth   . Tubular adenoma of colon   . Vertigo    Had Epley maneuver, vestibular rehab.    Past Surgical History:  Procedure Laterality Date  . CARDIAC CATHETERIZATION  2003   Following MI  .  CATARACT EXTRACTION, BILATERAL  2004  . CHOLECYSTECTOMY  2009  . CORONARY ANGIOPLASTY  2003   LAD  . CORONARY STENT PLACEMENT  2003   LCX  . HERNIA REPAIR  1994   double  . KIDNEY STONE SURGERY  10/2005  . KNEE SURGERY Left   . LEFT HEART CATH  02/05/2014  . LEFT HEART CATHETERIZATION WITH CORONARY ANGIOGRAM N/A 02/05/2014   Procedure: LEFT HEART CATHETERIZATION WITH CORONARY ANGIOGRAM;  Surgeon: Jettie Booze, MD;  Location: [facility] CATH LAB;  Service: Cardiovascular;  Laterality: N/A;  . LITHOTRIPSY  10/2005  . meniscus removal- knee Right   . PERCUTANEOUS CORONARY STENT INTERVENTION (PCI-S) Right 02/05/2014   Procedure: PERCUTANEOUS CORONARY STENT INTERVENTION (PCI-S);  Surgeon: Jettie Booze, MD;  Location: [facility]  CATH LAB;  Service: Cardiovascular;  Laterality: Right;  . SHOULDER OPEN ROTATOR CUFF REPAIR  1990     Current Outpatient Medications  Medication Sig Dispense Refill  . amLODipine (NORVASC) 5 MG tablet TAKE 1 TABLET BY MOUTH DAILY. 90 tablet 2  . aspirin EC 81 MG tablet Take 81 mg by mouth daily.    Marland Kitchen atorvastatin (LIPITOR) 10 MG tablet TAKE 1 TABLET BY MOUTH DAILY. 90 tablet 3  . cetirizine (ZYRTEC) 10 MG tablet Take 10 mg by mouth daily.    . clopidogrel (PLAVIX) 75 MG tablet TAKE 1 TABLET BY MOUTH DAILY. 90 tablet 3  . Coenzyme Q10 (CO Q 10 PO) Take 200 mg by mouth daily.     . cyanocobalamin 1000 MCG tablet Take 1,000 mcg by mouth daily.    Marland Kitchen ezetimibe (ZETIA) 10 MG tablet TAKE 1 TABLET BY MOUTH DAILY. 90 tablet 1  . fluticasone (FLONASE) 50 MCG/ACT nasal spray Place 1 spray into both nostrils daily.    Marland Kitchen gabapentin (NEURONTIN) 800 MG tablet TAKE 1 TABLET BY MOUTH 3 TIMES DAILY. 270 tablet 3  . guaiFENesin (MUCINEX) 600 MG 12 hr tablet Take 1 tablet (600 mg total) by mouth 2 (two) times daily as needed for cough or to loosen phlegm. 30 tablet 0  . guaiFENesin-codeine (ROBITUSSIN AC) 100-10 MG/5ML syrup Take 5 mLs by mouth at bedtime as needed for cough. 50 mL 0  . lisinopril (PRINIVIL,ZESTRIL) 20 MG tablet TAKE 1 TABLET (20 MG TOTAL) BY MOUTH DAILY. 90 tablet 3  . Magnesium 500 MG TABS Take 500 mg by mouth daily.    . metoprolol succinate (TOPROL-XL) 25 MG 24 hr tablet TAKE 1 TABLET BY MOUTH DAILY. 90 tablet 1  . Multiple Vitamins-Minerals (MULTIVITAMINS THER. W/MINERALS) TABS Take 1 tablet by mouth daily.    . nitroGLYCERIN (NITROSTAT) 0.4 MG SL tablet Place 1 tablet (0.4 mg total) under Gabriel tongue every 5 (five) minutes as needed for chest pain. 30 tablet 1  . Omega-3 Fatty Acids (OMEGA 3 PO) Take 1 tablet by mouth daily.    . sildenafil (REVATIO) 20 MG tablet Take 2 tablets (40 mg total) by mouth daily as needed (erectile dysfunction). 50 tablet 1  . Wheat Dextrin (BENEFIBER PO) Take  5 g by mouth daily.    Marland Kitchen zolpidem (AMBIEN) 10 MG tablet TAKE 1 TABLET BY MOUTH AT BEDTIME AS NEEDED FOR SLEEP 90 tablet 0   No current facility-administered medications for this visit.     Allergies:   Lipitor [atorvastatin calcium] and Mevacor [lovastatin]    Social History:  Gabriel [name]  reports that [name] has quit smoking. [name] has never used smokeless tobacco. [name] reports that [name] does not drink alcohol or  use drugs.   Family History:  Gabriel [name]'s family history includes Diabetes in his mother; Heart Problems in his mother; Other in his sister; Parkinson's disease (age of onset: 9) in his father.    ROS:   Noted in current history, otherwise review of systems is negative.  Physical Exam: Blood pressure 134/72, pulse 73, height 6' (1.829 m), weight 213 lb 6.4 oz (96.8 kg), SpO2 95 %.  GEN:  Well nourished, well developed in no acute distress HEENT: Normal NECK: No JVD; No carotid bruits LYMPHATICS: No lymphadenopathy CARDIAC: RRR , 2/6 systolic murmur c/w AS  RESPIRATORY:  Clear to auscultation without rales, wheezing or rhonchi  ABDOMEN: Soft, non-tender, non-distended MUSCULOSKELETAL:  No edema; No deformity  SKIN: Warm and dry NEUROLOGIC:  Alert and oriented x 3    EKG:     Recent Labs: 10/18/2017: B Natriuretic Peptide 34.2 10/20/2017: Hemoglobin 11.9; Platelets 206 11/17/2017: ALT 25; BUN 15; Creatinine, Ser 1.16; Potassium 4.3; Sodium 138    Lipid Panel    Component Value Date/Time   CHOL 153 11/17/2017 0819   TRIG 174 (H) 11/17/2017 0819   HDL 48 11/17/2017 0819   CHOLHDL 3.2 11/17/2017 0819   CHOLHDL 2.3 04/06/2016 0920   VLDL 25 04/06/2016 0920   LDLCALC 70 11/17/2017 0819   LDLDIRECT 145.5 09/16/2013 0811      Wt Readings from Last 3 Encounters:  11/23/17 213 lb 6.4 oz (96.8 kg)  10/26/17 213 lb 12 oz (97 kg)  10/18/17 209 lb 10.5 oz (95.1 kg)      Other studies Reviewed: Additional studies/ records that were reviewed today include: . Review of  Gabriel above records demonstrates:    ASSESSMENT AND PLAN:  1. Coronary artery disease -   Gabriel [name] with symptoms that are concerning for unstable angina pectoris/chest pain. worrsome for urinary artery disease. Gabriel [name] also has known aortic stenosis and aortic insufficiency.  We will get an echocardiogram for further assessment of that.  2. Mild systolic congestive heart failure : Not had any recurrent episodes of congestive heart failure.  3. Essential hypertension pressure overall looks good.  4. Hyperlipidemia-peds and other labs look stable.  5. Aortic stenosis/ aortic insufficiency    5. Obstructive sleep apnea  6. Abdominal bloating    Current medicines are reviewed at length with Gabriel [name] today.  Gabriel [name] does not have concerns regarding medicines.  Gabriel following changes have been made:  no change  Labs/ tests ordered today include:   No orders of Gabriel defined types were placed in this encounter.    Disposition:   FU with me in 6 months      Mertie Moores, MD  11/23/2017 [facility] [place], [place], [place]  [address] Phone: [phone]; Fax: [phone]

## 2017-11-23 NOTE — Patient Instructions (Signed)
Medication Instructions:  Your physician recommends that you continue on your current medications as directed. Please refer to the Current Medication list given to you today.   Labwork: None Ordered   Testing/Procedures: Your physician has requested that you have an echocardiogram. Echocardiography is a painless test that uses sound waves to create images of your heart. It provides your doctor with information about the size and shape of your heart and how well your heart's chambers and valves are working. This procedure takes approximately one hour. There are no restrictions for this procedure.  Your physician has requested that you have a lexiscan myoview. For further information please visit [email or web address]. Please follow instruction sheet, as given.   Follow-Up: Your physician recommends that you schedule a follow-up appointment in: 3 months with Dr. Acie Fredrickson   If you need a refill on your cardiac medications before your next appointment, please call your pharmacy.   Thank you for choosing [facility]! Christen Bame, RN [phone]

## 2017-11-24 ENCOUNTER — Other Ambulatory Visit: Payer: Self-pay | Admitting: Cardiovascular Disease

## 2017-11-24 MED ORDER — LISINOPRIL 20 MG PO TABS: 20.0000 mg | ORAL_TABLET | Freq: Every day | ORAL | 3 refills | Status: DC

## 2017-11-24 NOTE — Telephone Encounter (Signed)
Pt's medication was sent to pt's pharmacy as requested. Confirmation received.  °

## 2017-11-27 ENCOUNTER — Telehealth (HOSPITAL_COMMUNITY): Payer: Self-pay | Admitting: *Deleted

## 2017-11-27 NOTE — Telephone Encounter (Signed)
Patient given detailed instructions per Myocardial Perfusion Study Information Sheet for the test on 11/30/17. Patient notified to arrive 15 minutes early and that it is imperative to arrive on time for appointment to keep from having the test rescheduled.  If you need to cancel or reschedule your appointment, please call the office within 24 hours of your appointment. . Patient verbalized understanding.Gabriel [name] Jacqueline    

## 2017-11-30 ENCOUNTER — Ambulatory Visit (HOSPITAL_BASED_OUTPATIENT_CLINIC_OR_DEPARTMENT_OTHER): Payer: Medicare Other

## 2017-11-30 ENCOUNTER — Other Ambulatory Visit: Payer: Self-pay | Admitting: Primary Care

## 2017-11-30 ENCOUNTER — Other Ambulatory Visit: Payer: Self-pay

## 2017-11-30 ENCOUNTER — Ambulatory Visit (HOSPITAL_COMMUNITY): Payer: Medicare Other | Attending: Cardiovascular Disease

## 2017-11-30 DIAGNOSIS — E785 Hyperlipidemia, unspecified: Secondary | ICD-10-CM | POA: Diagnosis not present

## 2017-11-30 DIAGNOSIS — G4733 Obstructive sleep apnea (adult) (pediatric): Secondary | ICD-10-CM | POA: Insufficient documentation

## 2017-11-30 DIAGNOSIS — I25119 Atherosclerotic heart disease of native coronary artery with unspecified angina pectoris: Secondary | ICD-10-CM | POA: Diagnosis not present

## 2017-11-30 DIAGNOSIS — I352 Nonrheumatic aortic (valve) stenosis with insufficiency: Secondary | ICD-10-CM | POA: Diagnosis not present

## 2017-11-30 DIAGNOSIS — I77810 Thoracic aortic ectasia: Secondary | ICD-10-CM | POA: Diagnosis not present

## 2017-11-30 DIAGNOSIS — I110 Hypertensive heart disease with heart failure: Secondary | ICD-10-CM | POA: Insufficient documentation

## 2017-11-30 DIAGNOSIS — I350 Nonrheumatic aortic (valve) stenosis: Secondary | ICD-10-CM | POA: Diagnosis not present

## 2017-11-30 DIAGNOSIS — I509 Heart failure, unspecified: Secondary | ICD-10-CM | POA: Insufficient documentation

## 2017-11-30 DIAGNOSIS — G4700 Insomnia, unspecified: Secondary | ICD-10-CM

## 2017-11-30 LAB — MYOCARDIAL PERFUSION IMAGING
CHL CUP RESTING HR STRESS: 64 {beats}/min
LV sys vol: 99 mL
LVDIAVOL: 167 mL (ref 62–150)
NUC STRESS TID: 0.96
Peak HR: 79 {beats}/min
RATE: 0.37
SDS: 4
SRS: 7
SSS: 11

## 2017-11-30 MED ORDER — TECHNETIUM TC 99M TETROFOSMIN IV KIT
10.1000 | PACK | Freq: Once | INTRAVENOUS | Status: AC | PRN
  Administered 2017-11-30: 10.1 via INTRAVENOUS
  Filled 2017-11-30: qty 11

## 2017-11-30 MED ORDER — PERFLUTREN LIPID MICROSPHERE
1.0000 mL | INTRAVENOUS | Status: AC | PRN
  Administered 2017-11-30: 3 mL via INTRAVENOUS

## 2017-11-30 MED ORDER — REGADENOSON 0.4 MG/5ML IV SOLN
0.4000 mg | Freq: Once | INTRAVENOUS | Status: AC
  Administered 2017-11-30: 0.4 mg via INTRAVENOUS

## 2017-11-30 MED ORDER — TECHNETIUM TC 99M TETROFOSMIN IV KIT
32.6000 | PACK | Freq: Once | INTRAVENOUS | Status: AC | PRN
  Administered 2017-11-30: 32.6 via INTRAVENOUS
  Filled 2017-11-30: qty 33

## 2017-11-30 NOTE — Telephone Encounter (Signed)
Ok to refill? Electronically refill request for zolpidem (AMBIEN) 10 MG tablet  Last prescribed on 09/03/2017. Last seen on 10/26/2017

## 2017-11-30 NOTE — Telephone Encounter (Signed)
CPE scheduled for July, will update UDS at that time. Refill sent to pharmacy.

## 2018-02-14 ENCOUNTER — Encounter: Payer: Self-pay | Admitting: Cardiovascular Disease

## 2018-02-14 ENCOUNTER — Ambulatory Visit (INDEPENDENT_AMBULATORY_CARE_PROVIDER_SITE_OTHER): Payer: Medicare Other | Admitting: Cardiovascular Disease

## 2018-02-14 VITALS — BP 132/72 | HR 78 | Ht 72.0 in | Wt 207.2 lb

## 2018-02-14 DIAGNOSIS — I10 Essential (primary) hypertension: Secondary | ICD-10-CM | POA: Diagnosis not present

## 2018-02-14 DIAGNOSIS — I2583 Coronary atherosclerosis due to lipid rich plaque: Secondary | ICD-10-CM | POA: Diagnosis not present

## 2018-02-14 DIAGNOSIS — I2510 Atherosclerotic heart disease of native coronary artery without angina pectoris: Secondary | ICD-10-CM

## 2018-02-14 NOTE — Progress Notes (Signed)
Cardiology Office Note   Date:  02/14/2018   ID:  Gabriel [name], DOB [birth year], MRN [id number]  PCP:  Pleas Koch, NP  Cardiologist:   Mertie Moores, MD , previous [place] patient   Chief Complaint  Patient presents with  . Coronary Artery Disease  . Hypertension   Problem list 1. Coronary artery disease 2. Mild systolic congestive heart failure due to coronary artery disease 3. Essential hypertension 4. Hyperlipidemia 5. Obstructive sleep apnea 6. Abdominal bloating      Gabriel [name] is a 83 y.o. male who presents for his CAD  I preformed several PCIs in the 1995s - 2000  Had a recent PCI in July 2015.   ( mid LAD)   Not getting much regular exercise .    Cath in July 2015:  IMPRESSIONS:  1. Normal left main coronary artery. 2. 90% focal stenosis in the mid left anterior descending artery. This was successfully treated with a 2.25 x 12 promus drug-eluting stent, postdilated to 2.6 mm in diameter. Previously placed stent in the more proximal portion of the LAD is widely patent. 3. Patent stents in the mid left circumflex artery. Mild to moderate disease in the circumflex system. 4. Mild to moderate disease right coronary artery system. The distal RCA stent has mild in-stent restenosis. 5. Mildly decreased left ventricular systolic function. LVEDP 19 mmHg. Ejection fraction 40-45%.  Sept. 6, 2017:  Doing well.   Had left knee arthroscopy recently.  No complications. No CP , no dyspnea BP is well controlled   October 17, 2016:  Doing well His knee is feeling better  Recently went to [place] to spring training   Sept. 27, 2018:  Doing well Hx of stenting  No CP , no dyspnea.   Stays busy,  remodling the activity building at church.  Not as much aerobic exercise Needs a knee replacement.   Able to do some walking  Encouraged him to use an exercise   November 23, 2016:  Gabriel [name] was in the hospital for 4 days with pneumonia.  He still feels  fairly fatigued.  His breathing has improved. 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   February 14, 2018: There is seen back for follow-up visit.  He was having some episodes of chest discomfort when I saw him in April. Myoview study revealed a previous inferolateral apical myocardial infarction.  There was no evidence of ischemia.  Ejection fraction is 41%. Echocardiogram shows left ventricular systolic function with an EF of 35 to 40%.  He has grade 1 diastolic dysfunction.  He has mild to moderate aortic stenosis mild aortic insufficiency.  He has noticed that his CP have resolved after he improved his diet .   Has decreased his spicy and greasy foods.   Is traveling to the [place] in several weeks.   Is a retired Environmental education officer.   Will be speaking   Past Medical History:  Diagnosis Date  . 1st degree AV block   . Aortic valve disease    a. Mild AS, AI by echo 01/2014.  Marland Kitchen CAD (coronary artery disease)    a. s/p PTCA 1995. b. MI in 2003 s/p stenting to LAD 2003, s/p stent to Cx 2003. c. prior rotablator to PTCA vessel. d. Abnl nuc 01/2014: s/p DES to LAD, mild-mod nonobstructive  disease otherwise.  . Carpal tunnel syndrome   . GERD (gastroesophageal reflux disease)   . Hyperlipidemia    a. Can only tolerate low dose statins.  . Hypertension   . Insomnia   . Ischemic cardiomyopathy    a. EF 40-45% by echo 02/04/14, 40-45% by cath 02/05/14 (similar to 2010).  . Kidney stone   . Neuropathy   . OSA (obstructive sleep apnea)    with upper airway resistancy  . RBBB   . Small intestinal bacterial overgrowth   . Tubular adenoma of colon   . Vertigo    Had Epley maneuver, vestibular rehab.    Past Surgical History:  Procedure Laterality Date  . CARDIAC CATHETERIZATION  2003   Following MI  . CATARACT EXTRACTION, BILATERAL  2004  .  CHOLECYSTECTOMY  2009  . CORONARY ANGIOPLASTY  2003   LAD  . CORONARY STENT PLACEMENT  2003   LCX  . HERNIA REPAIR  1994   double  . KIDNEY STONE SURGERY  10/2005  . KNEE SURGERY Left   . LEFT HEART CATH  02/05/2014  . LEFT HEART CATHETERIZATION WITH CORONARY ANGIOGRAM N/A 02/05/2014   Procedure: LEFT HEART CATHETERIZATION WITH CORONARY ANGIOGRAM;  Surgeon: Jettie Booze, MD;  Location: [facility] CATH LAB;  Service: Cardiovascular;  Laterality: N/A;  . LITHOTRIPSY  10/2005  . meniscus removal- knee Right   . PERCUTANEOUS CORONARY STENT INTERVENTION (PCI-S) Right 02/05/2014   Procedure: PERCUTANEOUS CORONARY STENT INTERVENTION (PCI-S);  Surgeon: Jettie Booze, MD;  Location: [facility] CATH LAB;  Service: Cardiovascular;  Laterality: Right;  . SHOULDER OPEN ROTATOR CUFF REPAIR  1990     Current Outpatient Medications  Medication Sig Dispense Refill  . amLODipine (NORVASC) 5 MG tablet TAKE 1 TABLET BY MOUTH DAILY. 90 tablet 2  . aspirin EC 81 MG tablet Take 81 mg by mouth daily.    Marland Kitchen atorvastatin (LIPITOR) 10 MG tablet TAKE 1 TABLET BY MOUTH DAILY. 90 tablet 3  . cetirizine (ZYRTEC) 10 MG tablet Take 10 mg by mouth daily.    . clopidogrel (PLAVIX) 75 MG tablet TAKE 1 TABLET BY MOUTH DAILY. 90 tablet 3  . Coenzyme Q10 (CO Q 10 PO) Take 200 mg by mouth daily.     . cyanocobalamin 1000 MCG tablet Take 1,000 mcg by mouth daily.    Marland Kitchen ezetimibe (ZETIA) 10 MG tablet TAKE 1 TABLET BY MOUTH DAILY. 90 tablet 1  . fluticasone (FLONASE) 50 MCG/ACT nasal spray Place 1 spray into both nostrils daily.    Marland Kitchen gabapentin (NEURONTIN) 800 MG tablet TAKE 1 TABLET BY MOUTH 3 TIMES DAILY. 270 tablet 3  . guaiFENesin (MUCINEX) 600 MG 12 hr tablet Take 1 tablet (600 mg total) by mouth 2 (two) times daily as needed for cough or to loosen phlegm. 30 tablet 0  . guaiFENesin-codeine (ROBITUSSIN AC) 100-10 MG/5ML syrup Take 5 mLs by mouth at bedtime as needed for cough. 50 mL 0  . lisinopril (PRINIVIL,ZESTRIL) 20 MG tablet  Take 1 tablet (20 mg total) by mouth daily. 90 tablet 3  . Magnesium 500 MG TABS Take 500 mg by mouth daily.    . metoprolol succinate (TOPROL-XL) 25 MG 24 hr tablet TAKE 1 TABLET BY MOUTH DAILY. 90 tablet 1  . Multiple Vitamins-Minerals (MULTIVITAMINS THER. W/MINERALS) TABS Take 1 tablet by mouth daily.    . nitroGLYCERIN (NITROSTAT) 0.4 MG SL tablet Place 1 tablet (0.4 mg total) under the tongue every 5 (five) minutes as needed for chest pain.  30 tablet 1  . Omega-3 Fatty Acids (OMEGA 3 PO) Take 1 tablet by mouth daily.    . sildenafil (REVATIO) 20 MG tablet Take 2 tablets (40 mg total) by mouth daily as needed (erectile dysfunction). 50 tablet 1  . Wheat Dextrin (BENEFIBER PO) Take 5 g by mouth daily.    Marland Kitchen zolpidem (AMBIEN) 10 MG tablet TAKE 1 TABLET BY MOUTH AT BEDTIME AS NEEDED FOR SLEEP 90 tablet 0   No current facility-administered medications for this visit.     Allergies:   Lipitor [atorvastatin calcium] and Mevacor [lovastatin]    Social History:  The patient  reports that he has quit smoking. He has never used smokeless tobacco. He reports that he does not drink alcohol or use drugs.   Family History:  The patient's family history includes Diabetes in his mother; Heart Problems in his mother; Other in his sister; Parkinson's disease (age of onset: 17) in his father.    ROS:   Noted in current history, otherwise review of systems is negative.  Physical Exam: Blood pressure 132/72, pulse 78, height 6' (1.829 m), weight 207 lb 4 oz (94 kg), SpO2 95 %.  GEN:  Well nourished, well developed in no acute distress HEENT: Normal NECK: No JVD; No carotid bruits LYMPHATICS: No lymphadenopathy CARDIAC: RR, no murmurs, rubs, gallops RESPIRATORY:  Clear to auscultation without rales, wheezing or rhonchi  ABDOMEN: Soft, non-tender, non-distended MUSCULOSKELETAL:  No edema; No deformity  SKIN: Warm and dry NEUROLOGIC:  Alert and oriented x 3    EKG:     Recent Labs: 10/18/2017: B  Natriuretic Peptide 34.2 10/20/2017: Hemoglobin 11.9; Platelets 206 11/17/2017: ALT 25; BUN 15; Creatinine, Ser 1.16; Potassium 4.3; Sodium 138    Lipid Panel    Component Value Date/Time   CHOL 153 11/17/2017 0819   TRIG 174 (H) 11/17/2017 0819   HDL 48 11/17/2017 0819   CHOLHDL 3.2 11/17/2017 0819   CHOLHDL 2.3 04/06/2016 0920   VLDL 25 04/06/2016 0920   LDLCALC 70 11/17/2017 0819   LDLDIRECT 145.5 09/16/2013 0811      Wt Readings from Last 3 Encounters:  02/14/18 207 lb 4 oz (94 kg)  11/30/17 213 lb (96.6 kg)  11/23/17 213 lb 6.4 oz (96.8 kg)      Other studies Reviewed: Additional studies/ records that were reviewed today include: . Review of the above records demonstrates:    ASSESSMENT AND PLAN:  1. Coronary artery disease -   Hx of previoius inf. Lat apical MI. Recent Myoview study did not show any evidence of ischemia.  He has symptoms of chest discomfort and heartburn resolved after he improved his diet.  2. Mild systolic congestive heart failure :   Stable.  Is not had any complications.  3. Essential hypertension :   Blood pressure is well controlled.  4. Hyperlipidemia-   levels have looked good.  Will recheck in 6 months.  5. Aortic stenosis/ aortic insufficiency   Stable.  5. Obstructive sleep apnea  6. Abdominal bloating    Current medicines are reviewed at length with the patient today.  The patient does not have concerns regarding medicines.  The following changes have been made:  no change  Labs/ tests ordered today include:   No orders of the defined types were placed in this encounter.    Disposition:   FU with me in 6 months      Mertie Moores, MD  02/14/2018 10:18 AM    [facility] [place], [place], [place]  [address] Phone: [phone]; Fax: [phone]

## 2018-02-14 NOTE — Patient Instructions (Signed)
Medication Instructions:  Your physician recommends that you continue on your current medications as directed. Please refer to the Current Medication list given to you today.   Labwork: Your physician recommends that you return for lab work in: 6 months (BMET, LFT, Lipid).  Please come fasting for these labs (nothing to eat or drink after midnight).   Testing/Procedures: None   Follow-Up:  Your physician wants you to follow-up in: 6 months with Dr. Acie Fredrickson. You will receive a reminder letter in the mail two months in advance. If you don't receive a letter, please call our office to schedule the follow-up appointment.   Any Other Special Instructions Will Be Listed Below (If Applicable).     If you need a refill on your cardiac medications before your next appointment, please call your pharmacy.

## 2018-02-15 ENCOUNTER — Other Ambulatory Visit: Payer: Self-pay | Admitting: Primary Care

## 2018-02-15 ENCOUNTER — Other Ambulatory Visit: Payer: Self-pay | Admitting: Cardiovascular Disease

## 2018-02-15 DIAGNOSIS — G4700 Insomnia, unspecified: Secondary | ICD-10-CM

## 2018-02-15 NOTE — Telephone Encounter (Signed)
Electronically refill request for zolpidem (AMBIEN) 10 MG tablet  Last prescribed on 11/30/2017  Last office visit on 02/21/2017. AWV on 02/21/2018

## 2018-02-16 ENCOUNTER — Other Ambulatory Visit: Payer: Self-pay | Admitting: Primary Care

## 2018-02-16 DIAGNOSIS — R7303 Prediabetes: Secondary | ICD-10-CM

## 2018-02-21 ENCOUNTER — Ambulatory Visit (INDEPENDENT_AMBULATORY_CARE_PROVIDER_SITE_OTHER): Payer: Medicare Other

## 2018-02-21 VITALS — BP 140/80 | HR 54 | Temp 98.0°F | Ht 71.5 in | Wt 208.5 lb

## 2018-02-21 DIAGNOSIS — Z125 Encounter for screening for malignant neoplasm of prostate: Secondary | ICD-10-CM

## 2018-02-21 DIAGNOSIS — Z0000 Encounter for general adult medical examination without abnormal findings: Secondary | ICD-10-CM

## 2018-02-21 DIAGNOSIS — R7303 Prediabetes: Secondary | ICD-10-CM | POA: Diagnosis not present

## 2018-02-21 LAB — HEMOGLOBIN A1C: Hgb A1c MFr Bld: 6.2 % (ref 4.6–6.5)

## 2018-02-21 LAB — PSA, MEDICARE: PSA: 0.7 ng/ml (ref 0.10–4.00)

## 2018-02-21 NOTE — Patient Instructions (Addendum)
Gabriel [name] , Thank you for taking time to come for your Medicare Wellness Visit. I appreciate your ongoing commitment to your health goals. Please review the following plan we discussed and let me know if I can assist you in the future.   These are the goals we discussed: Goals    . Patient Stated     Starting 02/21/2018, I will continue to take medications as prescribed.        This is a list of the screening recommended for you and due dates:  Health Maintenance  Topic Date Due  . Flu Shot  03/01/2018  . Tetanus Vaccine  08/01/2021  . Pneumonia vaccines  Completed   Preventive Care for Adults  A healthy lifestyle and preventive care can promote health and wellness. Preventive health guidelines for adults include the following key practices.  . A routine yearly physical is a good way to check with your health care provider about your health and preventive screening. It is a chance to share any concerns and updates on your health and to receive a thorough exam.  . Visit your dentist for a routine exam and preventive care every 6 months. Brush your teeth twice a day and floss once a day. Good oral hygiene prevents tooth decay and gum disease.  . The frequency of eye exams is based on your age, health, family medical history, use  of contact lenses, and other factors. Follow your health care provider's recommendations for frequency of eye exams.  . Eat a healthy diet. Foods like vegetables, fruits, whole grains, low-fat dairy products, and lean protein foods contain the nutrients you need without too many calories. Decrease your intake of foods high in solid fats, added sugars, and salt. Eat the right amount of calories for you. Get information about a proper diet from your health care provider, if necessary.  . Regular physical exercise is one of the most important things you can do for your health. Most adults should get at least 150 minutes of moderate-intensity exercise (any activity  that increases your heart rate and causes you to sweat) each week. In addition, most adults need muscle-strengthening exercises on 2 or more days a week.  Silver Sneakers may be a benefit available to you. To determine eligibility, you may visit the website: www.silversneakers.com or contact program at [phone] Mon-Fri between 8AM-8PM.   . Maintain a healthy weight. The body mass index (BMI) is a screening tool to identify possible weight problems. It provides an estimate of body fat based on height and weight. Your health care provider can find your BMI and can help you achieve or maintain a healthy weight.   For adults 20 years and older: ? A BMI below 18.5 is considered underweight. ? A BMI of 18.5 to 24.9 is normal. ? A BMI of 25 to 29.9 is considered overweight. ? A BMI of 30 and above is considered obese.   . Maintain normal blood lipids and cholesterol levels by exercising and minimizing your intake of saturated fat. Eat a balanced diet with plenty of fruit and vegetables. Blood tests for lipids and cholesterol should begin at age 25 and be repeated every 5 years. If your lipid or cholesterol levels are high, you are over 50, or you are at high risk for heart disease, you may need your cholesterol levels checked more frequently. Ongoing high lipid and cholesterol levels should be treated with medicines if diet and exercise are not working.  . If you smoke, find  out from your health care provider how to quit. If you do not use tobacco, please do not start.  . If you choose to drink alcohol, please do not consume more than 2 drinks per day. One drink is considered to be 12 ounces (355 mL) of beer, 5 ounces (148 mL) of wine, or 1.5 ounces (44 mL) of liquor.  . If you are 28-4 years old, ask your health care provider if you should take aspirin to prevent strokes.  . Use sunscreen. Apply sunscreen liberally and repeatedly throughout the day. You should seek shade when your shadow is  shorter than you. Protect yourself by wearing long sleeves, pants, a wide-brimmed hat, and sunglasses year round, whenever you are outdoors.  . Once a month, do a whole body skin exam, using a mirror to look at the skin on your back. Tell your health care provider of new moles, moles that have irregular borders, moles that are larger than a pencil eraser, or moles that have changed in shape or color.

## 2018-02-22 NOTE — Progress Notes (Signed)
Subjective:   Gabriel [name] is a 83 y.o. male who presents for Medicare Annual/Subsequent preventive examination.  Review of Systems:  N/A Cardiac Risk Factors include: advanced age (>63men, >73 women);hypertension;dyslipidemia;male gender     Objective:    Vitals: BP 140/80 (BP Location: Left Arm, Patient Position: Sitting, Cuff Size: Normal)   Pulse (!) 54   Temp 98 F (36.7 C) (Oral)   Ht 5' 11.5" (1.816 m) Comment: no shoes  Wt 208 lb 8 oz (94.6 kg)   SpO2 94%   BMI 28.67 kg/m   Body mass index is 28.67 kg/m.  Advanced Directives 02/21/2018 10/18/2017 02/15/2017 02/14/2017 02/05/2014  Does Patient Have a Medical Advance Directive? Yes Yes Yes Yes Patient has advance directive, copy not in chart  Type of Advance Directive [place];Living will [place];Living will - [place];Living will [place];Living will  Does patient want to make changes to medical advance directive? - No - Patient declined - - No change requested  Copy of Lane in Chart? No - copy requested Yes - Yes Copy requested from family    Tobacco Social History   Tobacco Use  Smoking Status Former Smoker  Smokeless Tobacco Never Used  Tobacco Comment   1968     Counseling given: No Comment: 1968   Clinical Intake:  Pre-visit preparation completed: Yes  Pain : No/denies pain Pain Score: 0-No pain     Nutritional Status: BMI 25 -29 Overweight Nutritional Risks: None Diabetes: No  How often do you need to have someone help you when you read instructions, pamphlets, or other written materials from your doctor or pharmacy?: 1 - Never What is the last grade level you completed in school?: 12th grade  Interpreter Needed?: No  Comments: pt lives with spouse Information entered by :: LPinson, LPN  Past Medical History:  Diagnosis Date  . 1st degree AV block   . Aortic valve disease    a. Mild  AS, AI by echo 01/2014.  Marland Kitchen CAD (coronary artery disease)    a. s/p PTCA 1995. b. MI in 2003 s/p stenting to LAD 2003, s/p stent to Cx 2003. c. prior rotablator to PTCA vessel. d. Abnl nuc 01/2014: s/p DES to LAD, mild-mod nonobstructive disease otherwise.  . Carpal tunnel syndrome   . GERD (gastroesophageal reflux disease)   . Hyperlipidemia    a. Can only tolerate low dose statins.  . Hypertension   . Insomnia   . Ischemic cardiomyopathy    a. EF 40-45% by echo 02/04/14, 40-45% by cath 02/05/14 (similar to 2010).  . Kidney stone   . Neuropathy   . OSA (obstructive sleep apnea)    with upper airway resistancy  . RBBB   . Small intestinal bacterial overgrowth   . Tubular adenoma of colon   . Vertigo    Had Epley maneuver, vestibular rehab.   Past Surgical History:  Procedure Laterality Date  . BELPHAROPTOSIS REPAIR Bilateral 05/30/2017  . CARDIAC CATHETERIZATION  2003   Following MI  . CATARACT EXTRACTION, BILATERAL  2004  . CHOLECYSTECTOMY  2009  . CORONARY ANGIOPLASTY  2003   LAD  . CORONARY STENT PLACEMENT  2003   LCX  . HERNIA REPAIR  1994   double  . KIDNEY STONE SURGERY  10/2005  . KNEE SURGERY Left   . LEFT HEART CATH  02/05/2014  . LEFT HEART CATHETERIZATION WITH CORONARY ANGIOGRAM N/A 02/05/2014  Procedure: LEFT HEART CATHETERIZATION WITH CORONARY ANGIOGRAM;  Surgeon: Jettie Booze, MD;  Location: [facility] CATH LAB;  Service: Cardiovascular;  Laterality: N/A;  . LITHOTRIPSY  10/2005  . meniscus removal- knee Right   . PERCUTANEOUS CORONARY STENT INTERVENTION (PCI-S) Right 02/05/2014   Procedure: PERCUTANEOUS CORONARY STENT INTERVENTION (PCI-S);  Surgeon: Jettie Booze, MD;  Location: [facility] CATH LAB;  Service: Cardiovascular;  Laterality: Right;  . SHOULDER OPEN ROTATOR CUFF REPAIR  1990   Family History  Problem Relation Age of Onset  . Heart Problems Mother   . Diabetes Mother   . Parkinson's disease Father 27  . Other Sister        Muscular degeneration  . Heart  attack Neg Hx   . Stroke Neg Hx    Social History   Socioeconomic History  . Marital status: Married    Spouse name: [name]  . Number of children: 6  . Years of education: hs  . Highest education level: Not on file  Occupational History    Employer: RETIRED  Social Needs  . Financial resource strain: Not on file  . Food insecurity:    Worry: Not on file    Inability: Not on file  . Transportation needs:    Medical: Not on file    Non-medical: Not on file  Tobacco Use  . Smoking status: Former Research scientist (life sciences)  . Smokeless tobacco: Never Used  . Tobacco comment: 1968  Substance and Sexual Activity  . Alcohol use: No    Alcohol/week: 0.0 oz    Comment: rarely  . Drug use: No  . Sexual activity: Yes  Lifestyle  . Physical activity:    Days per week: Not on file    Minutes per session: Not on file  . Stress: Not on file  Relationships  . Social connections:    Talks on phone: Not on file    Gets together: Not on file    Attends religious service: Not on file    Active member of club or organization: Not on file    Attends meetings of clubs or organizations: Not on file    Relationship status: Not on file  Other Topics Concern  . Not on file  Social History Narrative   Patient is married [name]).   Patient has four biological children and two adopted children.   Patient is retired from Electronics engineer.   Patient has a high school education.   Patient drinks coffee (12oz) daily.   Patient is right-handed.   Enjoys traveling, working on projects.              Outpatient Encounter Medications as of 02/21/2018  Medication Sig  . amLODipine (NORVASC) 5 MG tablet TAKE 1 TABLET BY MOUTH DAILY.  Marland Kitchen aspirin EC 81 MG tablet Take 81 mg by mouth daily.  Marland Kitchen atorvastatin (LIPITOR) 10 MG tablet TAKE 1 TABLET BY MOUTH DAILY.  . cetirizine (ZYRTEC) 10 MG tablet Take 10 mg by mouth as needed.   . clopidogrel (PLAVIX) 75 MG tablet Take 1 tablet (75 mg total) by mouth  daily.  . Coenzyme Q10 (CO Q 10 PO) Take 200 mg by mouth daily.   . cyanocobalamin 1000 MCG tablet Take 1,000 mcg by mouth daily.  Marland Kitchen ezetimibe (ZETIA) 10 MG tablet TAKE 1 TABLET BY MOUTH DAILY.  . fluticasone (FLONASE) 50 MCG/ACT nasal spray Place 1 spray into both nostrils as needed.   . gabapentin (NEURONTIN) 800 MG tablet TAKE 1 TABLET BY MOUTH 3  TIMES DAILY.  Marland Kitchen guaiFENesin (MUCINEX) 600 MG 12 hr tablet Take 1 tablet (600 mg total) by mouth 2 (two) times daily as needed for cough or to loosen phlegm.  Marland Kitchen guaiFENesin-codeine (ROBITUSSIN AC) 100-10 MG/5ML syrup Take 5 mLs by mouth at bedtime as needed for cough.  Marland Kitchen lisinopril (PRINIVIL,ZESTRIL) 20 MG tablet Take 1 tablet (20 mg total) by mouth daily.  . Magnesium 500 MG TABS Take 500 mg by mouth daily.  . metoprolol succinate (TOPROL-XL) 25 MG 24 hr tablet TAKE 1 TABLET BY MOUTH DAILY.  . Multiple Vitamins-Minerals (MULTIVITAMINS THER. W/MINERALS) TABS Take 1 tablet by mouth daily.  . nitroGLYCERIN (NITROSTAT) 0.4 MG SL tablet Place 1 tablet (0.4 mg total) under the tongue every 5 (five) minutes as needed for chest pain.  . Omega-3 Fatty Acids (OMEGA 3 PO) Take 1 tablet by mouth daily.  . sildenafil (REVATIO) 20 MG tablet Take 2 tablets (40 mg total) by mouth daily as needed (erectile dysfunction).  . Wheat Dextrin (BENEFIBER PO) Take 5 g by mouth daily.  Marland Kitchen zolpidem (AMBIEN) 10 MG tablet TAKE 1 TABLET BY MOUTH AT BEDTIME AS NEEDED FOR SLEEP   No facility-administered encounter medications on file as of 02/21/2018.     Activities of Daily Living In your present state of health, do you have any difficulty performing the following activities: 02/21/2018 10/18/2017  Hearing? N N  Vision? N N  Difficulty concentrating or making decisions? N N  Walking or climbing stairs? Y N  Dressing or bathing? N N  Doing errands, shopping? N N  Preparing Food and eating ? N -  Using the Toilet? N -  In the past six months, have you accidently leaked urine?  N -  Do you have problems with loss of bowel control? N -  Managing your Medications? N -  Managing your Finances? N -  Housekeeping or managing your Housekeeping? N -  Some recent data might be hidden    Patient Care Team: Pleas Koch, NP as PCP - General (Internal Medicine) Nahser, Wonda Cheng, MD as PCP - Cardiology (Cardiology) Luberta Mutter, MD as Consulting Physician (Ophthalmology) Latanya Maudlin, MD as Consulting Physician (Orthopedic Surgery) Pyrtle, Lajuan Lines, MD as Consulting Physician (Gastroenterology) Kathie Rhodes, MD as Consulting Physician (Urology) Danella Sensing, MD as Consulting Physician (Dermatology) Dohmeier, Asencion Partridge, MD as Consulting Physician (Neurology)   Assessment:   This is a routine wellness examination for Gwyndolyn Saxon.   Hearing Screening   125Hz  250Hz  500Hz  1000Hz  2000Hz  3000Hz  4000Hz  6000Hz  8000Hz   Right ear:   40 40 40  40    Left ear:   40 40 40  40    Vision Screening Comments: Eye exam in 2019    Exercise Activities and Dietary recommendations Current Exercise Habits: The patient does not participate in regular exercise at present, Exercise limited by: None identified  Goals    . Patient Stated     Starting 02/21/2018, I will continue to take medications as prescribed.        Fall Risk Fall Risk  02/21/2018 02/14/2017 01/21/2016 09/21/2015  Falls in the past year? No No No No   Depression Screen PHQ 2/9 Scores 02/21/2018 02/14/2017 01/21/2016 09/21/2015  PHQ - 2 Score 0 0 0 0  PHQ- 9 Score 0 - - -    Cognitive Function MMSE - Mini Mental State Exam 02/21/2018 02/14/2017  Orientation to time 5 5  Orientation to Place 5 5  Registration 3 3  Attention/ Calculation 0  0  Recall 3 2  Language- name 2 objects 0 0  Language- repeat 1 1  Language- follow 3 step command 3 3  Language- read & follow direction 0 0  Write a sentence 0 0  Copy design 0 0  Total score 20 19     PLEASE NOTE: A Mini-Cog screen was completed. Maximum score is 20.  A value of 0 denotes this part of [place] MMSE was not completed or the patient failed this part of the Mini-Cog screening.   Mini-Cog Screening Orientation to Time - Max 5 pts Orientation to Place - Max 5 pts Registration - Max 3 pts Recall - Max 3 pts Language Repeat - Max 1 pts Language Follow 3 Step Command - Max 3 pts     Immunization History  Administered Date(s) Administered  . Pneumococcal Conjugate-13 01/21/2016  . Pneumococcal Polysaccharide-23 02/06/2014  . Td 08/02/2011   Screening Tests Health Maintenance  Topic Date Due  . INFLUENZA VACCINE  03/01/2018  . TETANUS/TDAP  08/01/2021  . PNA vac Low Risk Adult  Completed       Plan:     I have personally reviewed, addressed, and noted the following in the patient's chart:  A. Medical and social history B. Use of alcohol, tobacco or illicit drugs  C. Current medications and supplements D. Functional ability and status E.  Nutritional status F.  Physical activity G. Advance directives H. List of other physicians I.  Hospitalizations, surgeries, and ER visits in previous 12 months J.  [place] to include hearing, vision, cognitive, depression L. Referrals and appointments - none  In addition, I have reviewed and discussed with patient certain preventive protocols, quality metrics, and best practice recommendations. A written personalized care plan for preventive services as well as general preventive health recommendations were provided to patient.  See attached scanned questionnaire for additional information.   Signed,   Lindell Noe, MHA, BS, LPN Health Coach

## 2018-02-22 NOTE — Progress Notes (Signed)
PCP notes:   Health maintenance:  No gaps identified  Abnormal screenings:   None  Patient concerns:   None  Nurse concerns:  None  Next PCP appt:   02/23/18 @ 1140

## 2018-02-23 ENCOUNTER — Encounter: Payer: Self-pay | Admitting: Primary Care

## 2018-02-23 ENCOUNTER — Ambulatory Visit (INDEPENDENT_AMBULATORY_CARE_PROVIDER_SITE_OTHER): Payer: Medicare Other | Admitting: Primary Care

## 2018-02-23 ENCOUNTER — Encounter: Payer: Medicare Other | Admitting: Primary Care

## 2018-02-23 DIAGNOSIS — G629 Polyneuropathy, unspecified: Secondary | ICD-10-CM | POA: Diagnosis not present

## 2018-02-23 DIAGNOSIS — G4733 Obstructive sleep apnea (adult) (pediatric): Secondary | ICD-10-CM

## 2018-02-23 DIAGNOSIS — I359 Nonrheumatic aortic valve disorder, unspecified: Secondary | ICD-10-CM | POA: Diagnosis not present

## 2018-02-23 DIAGNOSIS — I10 Essential (primary) hypertension: Secondary | ICD-10-CM

## 2018-02-23 DIAGNOSIS — I255 Ischemic cardiomyopathy: Secondary | ICD-10-CM | POA: Diagnosis not present

## 2018-02-23 DIAGNOSIS — N529 Male erectile dysfunction, unspecified: Secondary | ICD-10-CM | POA: Diagnosis not present

## 2018-02-23 DIAGNOSIS — E7800 Pure hypercholesterolemia, unspecified: Secondary | ICD-10-CM | POA: Diagnosis not present

## 2018-02-23 DIAGNOSIS — Z9989 Dependence on other enabling machines and devices: Secondary | ICD-10-CM

## 2018-02-23 DIAGNOSIS — R7303 Prediabetes: Secondary | ICD-10-CM | POA: Diagnosis not present

## 2018-02-23 MED ORDER — SILDENAFIL CITRATE 20 MG PO TABS: 40.0000 mg | ORAL_TABLET | Freq: Every day | ORAL | 0 refills | Status: DC | PRN

## 2018-02-23 NOTE — Assessment & Plan Note (Signed)
Compliant to CPAP nightly. Following with neurology.

## 2018-02-23 NOTE — Assessment & Plan Note (Signed)
Recent lipid panel stable. Continue Lipitor.

## 2018-02-23 NOTE — Assessment & Plan Note (Signed)
Murmur evident on exam. Repeat echocardiogram reviewed without significant changes.

## 2018-02-23 NOTE — Assessment & Plan Note (Signed)
Following with neurology. Overall doing well on gabapentin 800 mg TID. Continue same.

## 2018-02-23 NOTE — Assessment & Plan Note (Signed)
Slight trend up with A1C over the last 1 year.  Discussed diet, recommended regular exercise. Repeat A1C in 6 months.

## 2018-02-23 NOTE — Assessment & Plan Note (Signed)
Stable in the office today.  Continue current regimen. 

## 2018-02-23 NOTE — Progress Notes (Signed)
Subjective:    Patient ID: Gabriel [name], male    DOB: Sep 04, 1934, 83 y.o.   MRN: [id number]  HPI  Gabriel [name] is an 83 year old male who presents today for [place] Part 2. He saw our health advisor last week.   1) CAD/Hypertension/Hyperlipidemia: Currently following with cardiology with last visit being last week. He recently underwent Myoview stress test without any evidence of ischemia, also repeat echocardiogram without changes. No changes made to medication or treatment regimen. Managed on atorvastatin 10 mg, clopidogrel 75 mg, Zetia 10 mg, lisinopril 20 mg, metoprolol succinate 25 mg.  2) Insomnia: Currently managed on Ambien 10 mg. Feels well managed on his medication. Denies vivid dreams, suicidal thoughts. He is compliant to his CPAP machine nightly.   3) Neuropathy: Currently managed on gabapentin 800 mg TID. He is following with neurology who also manages his CPAP. Overall he feels well managed.   4) Prediabetes: Recent A1C of 6.2.   Diet currently consists of:  Breakfast: Cereal, fruit Lunch: Sandwich Dinner: Meat, vegetable, starch Snacks: Rarely  Desserts: Ice cream, cookies. Most nights of the week.  Beverages: Coffee, water, some milk, flavored water  Exercise: He is active, not exercising.   Immunizations: -Tetanus: Completed in 2013 -Influenza: Due this season  -Pneumonia: Completed in 2017  Colonoscopy: Completed in 2018 PSA: Negative in 2019    Review of Systems  Constitutional: Negative for fatigue.  Eyes: Negative for visual disturbance.  Respiratory: Negative for shortness of breath.   Cardiovascular: Negative for chest pain.  Gastrointestinal:       Intermittent bloating.  Neurological: Negative for dizziness and headaches.  Psychiatric/Behavioral: Negative for sleep disturbance and suicidal ideas.       Past Medical History:  Diagnosis Date  . 1st degree AV block   . Aortic valve disease    a. Mild AS, AI by echo 01/2014.  Marland Kitchen CAD (coronary  artery disease)    a. s/p PTCA 1995. b. MI in 2003 s/p stenting to LAD 2003, s/p stent to Cx 2003. c. prior rotablator to PTCA vessel. d. Abnl nuc 01/2014: s/p DES to LAD, mild-mod nonobstructive disease otherwise.  . Carpal tunnel syndrome   . GERD (gastroesophageal reflux disease)   . Hyperlipidemia    a. Can only tolerate low dose statins.  . Hypertension   . Insomnia   . Ischemic cardiomyopathy    a. EF 40-45% by echo 02/04/14, 40-45% by cath 02/05/14 (similar to 2010).  . Kidney stone   . Neuropathy   . OSA (obstructive sleep apnea)    with upper airway resistancy  . RBBB   . Small intestinal bacterial overgrowth   . Tubular adenoma of colon   . Vertigo    Had Epley maneuver, vestibular rehab.     Social History   Socioeconomic History  . Marital status: Married    Spouse name: [name]  . Number of children: 6  . Years of education: hs  . Highest education level: Not on file  Occupational History    Employer: RETIRED  Social Needs  . Financial resource strain: Not on file  . Food insecurity:    Worry: Not on file    Inability: Not on file  . Transportation needs:    Medical: Not on file    Non-medical: Not on file  Tobacco Use  . Smoking status: Former Research scientist (life sciences)  . Smokeless tobacco: Never Used  . Tobacco comment: 1968  Substance and Sexual Activity  . Alcohol use:  No    Alcohol/week: 0.0 oz    Comment: rarely  . Drug use: No  . Sexual activity: Yes  Lifestyle  . Physical activity:    Days per week: Not on file    Minutes per session: Not on file  . Stress: Not on file  Relationships  . Social connections:    Talks on phone: Not on file    Gets together: Not on file    Attends religious service: Not on file    Active member of club or organization: Not on file    Attends meetings of clubs or organizations: Not on file    Relationship status: Not on file  . Intimate partner violence:    Fear of current or ex partner: Not on file    Emotionally abused: Not  on file    Physically abused: Not on file    Forced sexual activity: Not on file  Other Topics Concern  . Not on file  Social History Narrative   Patient is married [name]).   Patient has four biological children and two adopted children.   Patient is retired from Electronics engineer.   Patient has a high school education.   Patient drinks coffee (12oz) daily.   Patient is right-handed.   Enjoys traveling, working on projects.              Past Surgical History:  Procedure Laterality Date  . BELPHAROPTOSIS REPAIR Bilateral 05/30/2017  . CARDIAC CATHETERIZATION  2003   Following MI  . CATARACT EXTRACTION, BILATERAL  2004  . CHOLECYSTECTOMY  2009  . CORONARY ANGIOPLASTY  2003   LAD  . CORONARY STENT PLACEMENT  2003   LCX  . HERNIA REPAIR  1994   double  . KIDNEY STONE SURGERY  10/2005  . KNEE SURGERY Left   . LEFT HEART CATH  02/05/2014  . LEFT HEART CATHETERIZATION WITH CORONARY ANGIOGRAM N/A 02/05/2014   Procedure: LEFT HEART CATHETERIZATION WITH CORONARY ANGIOGRAM;  Surgeon: Jettie Booze, MD;  Location: [facility] CATH LAB;  Service: Cardiovascular;  Laterality: N/A;  . LITHOTRIPSY  10/2005  . meniscus removal- knee Right   . PERCUTANEOUS CORONARY STENT INTERVENTION (PCI-S) Right 02/05/2014   Procedure: PERCUTANEOUS CORONARY STENT INTERVENTION (PCI-S);  Surgeon: Jettie Booze, MD;  Location: [facility] CATH LAB;  Service: Cardiovascular;  Laterality: Right;  . SHOULDER OPEN ROTATOR CUFF REPAIR  1990    Family History  Problem Relation Age of Onset  . Heart Problems Mother   . Diabetes Mother   . Parkinson's disease Father 54  . Other Sister        Muscular degeneration  . Heart attack Neg Hx   . Stroke Neg Hx     Allergies  Allergen Reactions  . Lipitor [Atorvastatin Calcium] Other (See Comments)    Muscle pain--Pt stated he can tolerate a low dose  . Mevacor [Lovastatin] Other (See Comments)    "makes me feel loopy"    Current Outpatient  Medications on File Prior to Visit  Medication Sig Dispense Refill  . amLODipine (NORVASC) 5 MG tablet TAKE 1 TABLET BY MOUTH DAILY. 90 tablet 2  . aspirin EC 81 MG tablet Take 81 mg by mouth daily.    Marland Kitchen atorvastatin (LIPITOR) 10 MG tablet TAKE 1 TABLET BY MOUTH DAILY. 90 tablet 3  . cetirizine (ZYRTEC) 10 MG tablet Take 10 mg by mouth as needed.     . clopidogrel (PLAVIX) 75 MG tablet Take 1 tablet (75 mg total)  by mouth daily. 90 tablet 3  . Coenzyme Q10 (CO Q 10 PO) Take 200 mg by mouth daily.     . cyanocobalamin 1000 MCG tablet Take 1,000 mcg by mouth daily.    Marland Kitchen ezetimibe (ZETIA) 10 MG tablet TAKE 1 TABLET BY MOUTH DAILY. 90 tablet 1  . fluticasone (FLONASE) 50 MCG/ACT nasal spray Place 1 spray into both nostrils as needed.     . gabapentin (NEURONTIN) 800 MG tablet TAKE 1 TABLET BY MOUTH 3 TIMES DAILY. 270 tablet 3  . guaiFENesin (MUCINEX) 600 MG 12 hr tablet Take 1 tablet (600 mg total) by mouth 2 (two) times daily as needed for cough or to loosen phlegm. 30 tablet 0  . guaiFENesin-codeine (ROBITUSSIN AC) 100-10 MG/5ML syrup Take 5 mLs by mouth at bedtime as needed for cough. 50 mL 0  . lisinopril (PRINIVIL,ZESTRIL) 20 MG tablet Take 1 tablet (20 mg total) by mouth daily. 90 tablet 3  . Magnesium 500 MG TABS Take 500 mg by mouth daily.    . metoprolol succinate (TOPROL-XL) 25 MG 24 hr tablet TAKE 1 TABLET BY MOUTH DAILY. 90 tablet 1  . Multiple Vitamins-Minerals (MULTIVITAMINS THER. W/MINERALS) TABS Take 1 tablet by mouth daily.    . nitroGLYCERIN (NITROSTAT) 0.4 MG SL tablet Place 1 tablet (0.4 mg total) under the tongue every 5 (five) minutes as needed for chest pain. 30 tablet 1  . Omega-3 Fatty Acids (OMEGA 3 PO) Take 1 tablet by mouth daily.    . Wheat Dextrin (BENEFIBER PO) Take 5 g by mouth daily.    Marland Kitchen zolpidem (AMBIEN) 10 MG tablet TAKE 1 TABLET BY MOUTH AT BEDTIME AS NEEDED FOR SLEEP 90 tablet 0   No current facility-administered medications on file prior to visit.     BP  122/68 (BP Location: Left Arm, Patient Position: Sitting, Cuff Size: Normal)   Pulse 71   Temp 98.2 F (36.8 C) (Oral)   Ht 5' 11.5" (1.816 m)   Wt 209 lb 8 oz (95 kg)   SpO2 95%   BMI 28.81 kg/m    Objective:   Physical Exam  Constitutional: He is oriented to person, place, and time. He appears well-nourished.  HENT:  Mouth/Throat: No oropharyngeal exudate.  Eyes: Pupils are equal, round, and reactive to light. EOM are normal.  Neck: Neck supple. No thyromegaly present.  Cardiovascular: Normal rate and regular rhythm.  Murmur heard. Respiratory: Effort normal and breath sounds normal.  GI: Soft. Bowel sounds are normal. There is no tenderness.  Musculoskeletal: Normal range of motion.  Neurological: He is alert and oriented to person, place, and time.  Skin: Skin is warm and dry.  Psychiatric: He has a normal mood and affect.           Assessment & Plan:

## 2018-02-23 NOTE — Progress Notes (Signed)
I reviewed health advisor's note, was available for consultation, and agree with documentation and plan.  

## 2018-02-23 NOTE — Patient Instructions (Addendum)
Avoid use of nitroglycerin and sildenafil together.  Start exercising. You should be getting 150 minutes of exercise weekly.  Make sure to eat a healthy diet with plenty of vegetables, fruit, whole grains, lean protein.  Ensure you are consuming 64 ounces of water daily.  Schedule a lab only appointment in 6 months to repeat your A1C test. You do not need to fast.  Follow up in 1 year for your annual exam or sooner if needed.  It was a pleasure to see you today!

## 2018-02-23 NOTE — Assessment & Plan Note (Signed)
Following with cardiology. Recent Myoview negative for ischemia, recent echo without changes.

## 2018-03-23 DIAGNOSIS — Z961 Presence of intraocular lens: Secondary | ICD-10-CM | POA: Diagnosis not present

## 2018-03-23 DIAGNOSIS — H52203 Unspecified astigmatism, bilateral: Secondary | ICD-10-CM | POA: Diagnosis not present

## 2018-03-26 DIAGNOSIS — L821 Other seborrheic keratosis: Secondary | ICD-10-CM | POA: Diagnosis not present

## 2018-03-26 DIAGNOSIS — Z85828 Personal history of other malignant neoplasm of skin: Secondary | ICD-10-CM | POA: Diagnosis not present

## 2018-03-26 DIAGNOSIS — L570 Actinic keratosis: Secondary | ICD-10-CM | POA: Diagnosis not present

## 2018-03-26 DIAGNOSIS — B356 Tinea cruris: Secondary | ICD-10-CM | POA: Diagnosis not present

## 2018-03-26 DIAGNOSIS — D1801 Hemangioma of skin and subcutaneous tissue: Secondary | ICD-10-CM | POA: Diagnosis not present

## 2018-03-26 DIAGNOSIS — D225 Melanocytic nevi of trunk: Secondary | ICD-10-CM | POA: Diagnosis not present

## 2018-03-26 DIAGNOSIS — D2271 Melanocytic nevi of right lower limb, including hip: Secondary | ICD-10-CM | POA: Diagnosis not present

## 2018-04-16 ENCOUNTER — Other Ambulatory Visit: Payer: Self-pay | Admitting: Cardiovascular Disease

## 2018-05-04 ENCOUNTER — Other Ambulatory Visit: Payer: Self-pay | Admitting: Cardiovascular Disease

## 2018-05-08 ENCOUNTER — Other Ambulatory Visit: Payer: Self-pay | Admitting: Primary Care

## 2018-05-08 DIAGNOSIS — G4700 Insomnia, unspecified: Secondary | ICD-10-CM

## 2018-05-09 NOTE — Telephone Encounter (Signed)
Noted, refill sent to pharmacy. 

## 2018-05-09 NOTE — Telephone Encounter (Signed)
Name of Medication: zolpidem (AMBIEN) 10 MG tablet  Name of Pharmacy: [facility] or Written Date and Quantity: 02/16/2018 #90  Last Office Visit and Type: 02/23/2018 Part 2 AWV

## 2018-06-04 ENCOUNTER — Telehealth: Payer: Self-pay | Admitting: Primary Care

## 2018-06-04 ENCOUNTER — Encounter: Payer: Self-pay | Admitting: *Deleted

## 2018-06-11 NOTE — Telephone Encounter (Signed)
Received request for Prior Auth for zolpidem (AMBIEN) 10 MG tablet   [facility] This request was competed.  Approved start date 05/25/2018 end date 05/25/2018 until further notice

## 2018-08-03 ENCOUNTER — Other Ambulatory Visit: Payer: Self-pay | Admitting: Cardiovascular Disease

## 2018-08-07 ENCOUNTER — Other Ambulatory Visit: Payer: Medicare Other | Admitting: *Deleted

## 2018-08-07 DIAGNOSIS — I2583 Coronary atherosclerosis due to lipid rich plaque: Secondary | ICD-10-CM | POA: Diagnosis not present

## 2018-08-07 DIAGNOSIS — I10 Essential (primary) hypertension: Secondary | ICD-10-CM

## 2018-08-07 DIAGNOSIS — I2510 Atherosclerotic heart disease of native coronary artery without angina pectoris: Secondary | ICD-10-CM

## 2018-08-07 LAB — LIPID PANEL
Chol/HDL Ratio: 3.1 ratio (ref 0.0–5.0)
Cholesterol, Total: 155 mg/dL (ref 100–199)
HDL: 50 mg/dL (ref >39)
LDL Calculated: 70 mg/dL (ref 0–99)
Triglycerides: 175 mg/dL — ABNORMAL HIGH (ref 0–149)
VLDL Cholesterol Cal: 35 mg/dL (ref 5–40)

## 2018-08-07 LAB — HEPATIC FUNCTION PANEL
ALT: 22 IU/L (ref 0–44)
AST: 21 IU/L (ref 0–40)
Albumin: 4 g/dL (ref 3.5–4.7)
Alkaline Phosphatase: 52 IU/L (ref 39–117)
Bilirubin Total: 0.6 mg/dL (ref 0.0–1.2)
Bilirubin, Direct: 0.18 mg/dL (ref 0.00–0.40)
Total Protein: 6.7 g/dL (ref 6.0–8.5)

## 2018-08-07 LAB — BASIC METABOLIC PANEL
BUN/Creatinine Ratio: 15 (ref 10–24)
BUN: 19 mg/dL (ref 8–27)
CO2: 23 mmol/L (ref 20–29)
Calcium: 9 mg/dL (ref 8.6–10.2)
Chloride: 102 mmol/L (ref 96–106)
Creatinine, Ser: 1.23 mg/dL (ref 0.76–1.27)
GFR calc Af Amer: 63 mL/min/{1.73_m2} (ref >59)
GFR calc non Af Amer: 55 mL/min/{1.73_m2} — ABNORMAL LOW (ref >59)
GLUCOSE: 109 mg/dL — AB (ref 65–99)
Potassium: 4.6 mmol/L (ref 3.5–5.2)
Sodium: 138 mmol/L (ref 134–144)

## 2018-08-13 ENCOUNTER — Encounter: Payer: Self-pay | Admitting: Cardiovascular Disease

## 2018-08-13 ENCOUNTER — Ambulatory Visit (INDEPENDENT_AMBULATORY_CARE_PROVIDER_SITE_OTHER): Payer: Medicare Other | Admitting: Cardiovascular Disease

## 2018-08-13 VITALS — BP 132/78 | HR 73 | Ht 71.5 in | Wt 208.8 lb

## 2018-08-13 DIAGNOSIS — I5022 Chronic systolic (congestive) heart failure: Secondary | ICD-10-CM

## 2018-08-13 DIAGNOSIS — I2583 Coronary atherosclerosis due to lipid rich plaque: Secondary | ICD-10-CM | POA: Diagnosis not present

## 2018-08-13 DIAGNOSIS — I2510 Atherosclerotic heart disease of native coronary artery without angina pectoris: Secondary | ICD-10-CM | POA: Diagnosis not present

## 2018-08-13 DIAGNOSIS — I25119 Atherosclerotic heart disease of native coronary artery with unspecified angina pectoris: Secondary | ICD-10-CM

## 2018-08-13 DIAGNOSIS — E782 Mixed hyperlipidemia: Secondary | ICD-10-CM | POA: Diagnosis not present

## 2018-08-13 DIAGNOSIS — I351 Nonrheumatic aortic (valve) insufficiency: Secondary | ICD-10-CM

## 2018-08-13 NOTE — Patient Instructions (Signed)
Medication Instructions:  Your provider recommends that you continue on your current medications as directed. Please refer to the Current Medication list given to you today.    Labwork: You will have FASTING labs prior to your next appointment in 6 months.  Testing/Procedures: None  Follow-Up: Your provider wants you to follow-up in: 6 months with Dr. Acie Fredrickson. You will receive a reminder letter in the mail two months in advance. If you don't receive a letter, please call our office to schedule the follow-up appointment.    Any Other Special Instructions Will Be Listed Below (If Applicable).     If you need a refill on your cardiac medications before your next appointment, please call your pharmacy.

## 2018-08-13 NOTE — Progress Notes (Signed)
Cardiology Office Note   Date:  08/13/2018   ID:  Gabriel [name], DOB Oct 17, 1934, MRN [id number]  PCP:  Gabriel Koch, NP  Cardiologist:   Gabriel Moores, MD , previous [facility] patient   No chief complaint on file.  Problem list 1. Coronary artery disease 2. Mild systolic congestive heart failure due to coronary artery disease 3. Essential hypertension 4. Hyperlipidemia 5. Obstructive sleep apnea 6. Abdominal bloating      Gabriel [name] is a 84 y.o. male who presents for his CAD  I preformed several PCIs in the 1995s - 2000  Had a recent PCI in July 2015.   ( mid LAD)   Not getting much regular exercise .    Cath in July 2015:  IMPRESSIONS:  1. Normal left main coronary artery. 2. 90% focal stenosis in the mid left anterior descending artery. This was successfully treated with a 2.25 x 12 promus drug-eluting stent, postdilated to 2.6 mm in diameter. Previously placed stent in the more proximal portion of the LAD is widely patent. 3. Patent stents in the mid left circumflex artery. Mild to moderate disease in the circumflex system. 4. Mild to moderate disease right coronary artery system. The distal RCA stent has mild in-stent restenosis. 5. Mildly decreased left ventricular systolic function. LVEDP 19 mmHg. Ejection fraction 40-45%.  Sept. 6, 2017:  Doing well.   Had left knee arthroscopy recently.  No complications. No CP , no dyspnea BP is well controlled   October 17, 2016:  Doing well His knee is feeling better  Recently went to [place] to spring training   Sept. 27, 2018:  Doing well Hx of stenting  No CP , no dyspnea.   Stays busy,  remodling the activity building at church.  Not as much aerobic exercise Needs a knee replacement.   Able to do some walking  Encouraged him to use an exercise   November 23, 2016:  Gabriel [name] was in the hospital for 4 days with pneumonia.  He still feels fairly fatigued.  His breathing has improved. 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   February 14, 2018: There is seen back for follow-up visit.  He was having some episodes of chest discomfort when I saw him in April. Myoview study revealed a previous inferolateral apical myocardial infarction.  There was no evidence of ischemia.  Ejection fraction is 41%. Echocardiogram shows left ventricular systolic function with an EF of 35 to 40%.  He has grade 1 diastolic dysfunction.  He has mild to moderate aortic stenosis mild aortic insufficiency.  He has noticed that his CP have resolved after he improved his diet .   Has decreased his spicy and greasy foods.   Is traveling to the [place] in several weeks.   Is a retired Environmental education officer.   Will be speaking   Jan. 13, 2020 Doing well.   No further chest pain  Exercise some.   Busy with projects  No leg edema.   Breathing is ok    Past Medical History:  Diagnosis Date  . 1st degree AV block   . Aortic valve disease    a. Mild AS, AI by echo 01/2014.  Marland Kitchen CAD (coronary artery disease)    a. s/p PTCA 1995. b. MI in 2003 s/p stenting to LAD 2003,  s/p stent to Cx 2003. c. prior rotablator to PTCA vessel. d. Abnl nuc 01/2014: s/p DES to LAD, mild-mod nonobstructive disease otherwise.  . Carpal tunnel syndrome   . GERD (gastroesophageal reflux disease)   . Hyperlipidemia    a. Can only tolerate low dose statins.  . Hypertension   . Insomnia   . Ischemic cardiomyopathy    a. EF 40-45% by echo 02/04/14, 40-45% by cath 02/05/14 (similar to 2010).  . Kidney stone   . Neuropathy   . OSA (obstructive sleep apnea)    with upper airway resistancy  . RBBB   . Small intestinal bacterial overgrowth   . Tubular adenoma of colon   . Vertigo    Had Epley maneuver, vestibular rehab.    Past Surgical History:  Procedure Laterality Date  . BELPHAROPTOSIS REPAIR  Bilateral 05/30/2017  . CARDIAC CATHETERIZATION  2003   Following MI  . CATARACT EXTRACTION, BILATERAL  2004  . CHOLECYSTECTOMY  2009  . CORONARY ANGIOPLASTY  2003   LAD  . CORONARY STENT PLACEMENT  2003   LCX  . HERNIA REPAIR  1994   double  . KIDNEY STONE SURGERY  10/2005  . KNEE SURGERY Left   . LEFT HEART CATH  02/05/2014  . LEFT HEART CATHETERIZATION WITH CORONARY ANGIOGRAM N/A 02/05/2014   Procedure: LEFT HEART CATHETERIZATION WITH CORONARY ANGIOGRAM;  Surgeon: Gabriel Booze, MD;  Location: [facility] CATH LAB;  Service: Cardiovascular;  Laterality: N/A;  . LITHOTRIPSY  10/2005  . meniscus removal- knee Right   . PERCUTANEOUS CORONARY STENT INTERVENTION (PCI-S) Right 02/05/2014   Procedure: PERCUTANEOUS CORONARY STENT INTERVENTION (PCI-S);  Surgeon: Gabriel Booze, MD;  Location: [facility] CATH LAB;  Service: Cardiovascular;  Laterality: Right;  . SHOULDER OPEN ROTATOR CUFF REPAIR  1990     Current Outpatient Medications  Medication Sig Dispense Refill  . amLODipine (NORVASC) 5 MG tablet TAKE 1 TABLET BY MOUTH DAILY. 90 tablet 2  . aspirin EC 81 MG tablet Take 81 mg by mouth daily.    Marland Kitchen atorvastatin (LIPITOR) 10 MG tablet TAKE 1 TABLET BY MOUTH DAILY. 90 tablet 1  . cetirizine (ZYRTEC) 10 MG tablet Take 10 mg by mouth as needed.     . clopidogrel (PLAVIX) 75 MG tablet Take 1 tablet (75 mg total) by mouth daily. 90 tablet 3  . Coenzyme Q10 (CO Q 10 PO) Take 200 mg by mouth daily.     . cyanocobalamin 1000 MCG tablet Take 1,000 mcg by mouth daily.    Marland Kitchen ezetimibe (ZETIA) 10 MG tablet TAKE 1 TABLET BY MOUTH DAILY. 90 tablet 3  . fluticasone (FLONASE) 50 MCG/ACT nasal spray Place 1 spray into both nostrils as needed.     . gabapentin (NEURONTIN) 800 MG tablet TAKE 1 TABLET BY MOUTH 3 TIMES DAILY. 270 tablet 3  . lisinopril (PRINIVIL,ZESTRIL) 20 MG tablet Take 1 tablet (20 mg total) by mouth daily. 90 tablet 3  . Magnesium 500 MG TABS Take 500 mg by mouth daily.    . metoprolol succinate  (TOPROL-XL) 25 MG 24 hr tablet TAKE 1 TABLET BY MOUTH DAILY. 90 tablet 3  . Multiple Vitamins-Minerals (MULTIVITAMINS THER. W/MINERALS) TABS Take 1 tablet by mouth daily.    . nitroGLYCERIN (NITROSTAT) 0.4 MG SL tablet Place 1 tablet (0.4 mg total) under the tongue every 5 (five) minutes as needed for chest pain. 30 tablet 1  . Omega-3 Fatty Acids (OMEGA 3 PO) Take 1 tablet by mouth daily.    Marland Kitchen  sildenafil (REVATIO) 20 MG tablet Take 2 tablets (40 mg total) by mouth daily as needed (erectile dysfunction). 50 tablet 0  . Wheat Dextrin (BENEFIBER PO) Take 5 g by mouth daily.    Marland Kitchen zolpidem (AMBIEN) 10 MG tablet TAKE 1 TABLET BY MOUTH AT BEDTIME AS NEEDED FOR SLEEP 90 tablet 0   No current facility-administered medications for this visit.     Allergies:   Lipitor [atorvastatin calcium] and Mevacor [lovastatin]    Social History:  The patient  reports that he has quit smoking. He has never used smokeless tobacco. He reports that he does not drink alcohol or use drugs.   Family History:  The patient's family history includes Diabetes in his mother; Heart Problems in his mother; Other in his sister; Parkinson's disease (age of onset: 70) in his father.    ROS:   Noted in current history, otherwise review of systems is negative.  Physical Exam: Blood pressure 132/78, pulse 73, height 5' 11.5" (1.816 m), weight 208 lb 12.8 oz (94.7 kg), SpO2 94 %.  GEN:  Well nourished, well developed in no acute distress HEENT: Normal NECK: No JVD; No carotid bruits LYMPHATICS: No lymphadenopathy CARDIAC: RRR ,   2/6 systolic murmur ,  RESPIRATORY:  Clear to auscultation without rales, wheezing or rhonchi  ABDOMEN: Soft, non-tender, non-distended MUSCULOSKELETAL:  No edema; No deformity  SKIN: Warm and dry NEUROLOGIC:  Alert and oriented x 3  EKG:   August 13, 2018: Normal sinus rhythm at 73.  First-degree AV block.  Minimal voltage criteria for left ventricular hypertrophy.  Nonspecific ST and T wave  abnormalities.  Recent Labs: 10/18/2017: B Natriuretic Peptide 34.2 10/20/2017: Hemoglobin 11.9; Platelets 206 08/07/2018: ALT 22; BUN 19; Creatinine, Ser 1.23; Potassium 4.6; Sodium 138    Lipid Panel    Component Value Date/Time   CHOL 155 08/07/2018 0815   TRIG 175 (H) 08/07/2018 0815   HDL 50 08/07/2018 0815   CHOLHDL 3.1 08/07/2018 0815   CHOLHDL 2.3 04/06/2016 0920   VLDL 25 04/06/2016 0920   LDLCALC 70 08/07/2018 0815   LDLDIRECT 145.5 09/16/2013 0811      Wt Readings from Last 3 Encounters:  08/13/18 208 lb 12.8 oz (94.7 kg)  02/23/18 209 lb 8 oz (95 kg)  02/21/18 208 lb 8 oz (94.6 kg)      Other studies Reviewed: Additional studies/ records that were reviewed today include: . Review of the above records demonstrates:    ASSESSMENT AND PLAN:  1. Coronary artery disease -   he is doing well.  He is not having any episodes of angina.  2. Mild systolic congestive heart failure :   Signs or symptoms of congestive heart failure.  3. Essential hypertension : Pressure readings look good.    4. Hyperlipidemia-    lipid levels overall look good.  His triglyceride level and glucose levels are mildly elevated.  I have encouraged him to ambulate on a regular basis.  5. Aortic stenosis/ aortic insufficiency   Mild/moderate aortic stenosis.  This appears to be stable.  Current medicines are reviewed at length with the patient today.  The patient does not have concerns regarding medicines.  The following changes have been made:  no change  Labs/ tests ordered today include:   Orders Placed This Encounter  Procedures  . Basic metabolic panel  . Hepatic function panel  . Lipid panel  . EKG 12-Lead     Disposition:   FU with me in 6 months  with lipid levels, liver enzymes, and basic metabolic profile.    Gabriel Moores, MD  08/13/2018 8:31 AM    [address], [place], [place]  [address] Phone: [phone]; Fax: [phone]

## 2018-08-17 ENCOUNTER — Other Ambulatory Visit: Payer: Self-pay | Admitting: Primary Care

## 2018-08-17 DIAGNOSIS — G4700 Insomnia, unspecified: Secondary | ICD-10-CM

## 2018-08-17 NOTE — Telephone Encounter (Signed)
Last prescribed on 05/09/2018  #90  with 0  refills. Last office visit on 02/23/2018. Next future appointment 02/28/2019 for AWV

## 2018-08-17 NOTE — Telephone Encounter (Signed)
Noted, refill sent to pharmacy. 

## 2018-08-21 ENCOUNTER — Other Ambulatory Visit: Payer: Self-pay | Admitting: Primary Care

## 2018-08-21 DIAGNOSIS — R7303 Prediabetes: Secondary | ICD-10-CM

## 2018-08-27 ENCOUNTER — Other Ambulatory Visit (INDEPENDENT_AMBULATORY_CARE_PROVIDER_SITE_OTHER): Payer: Medicare Other

## 2018-08-27 DIAGNOSIS — R7303 Prediabetes: Secondary | ICD-10-CM | POA: Diagnosis not present

## 2018-08-27 LAB — POCT GLYCOSYLATED HEMOGLOBIN (HGB A1C): Hemoglobin A1C: 5.8 % — AB (ref 4.0–5.6)

## 2018-09-19 ENCOUNTER — Encounter: Payer: Self-pay | Admitting: Neurology

## 2018-09-20 ENCOUNTER — Encounter: Payer: Self-pay | Admitting: Neurology

## 2018-09-20 ENCOUNTER — Ambulatory Visit (INDEPENDENT_AMBULATORY_CARE_PROVIDER_SITE_OTHER): Payer: Medicare Other | Admitting: Neurology

## 2018-09-20 VITALS — BP 126/78 | HR 76 | Ht 72.5 in | Wt 210.0 lb

## 2018-09-20 DIAGNOSIS — J322 Chronic ethmoidal sinusitis: Secondary | ICD-10-CM

## 2018-09-20 DIAGNOSIS — Z9989 Dependence on other enabling machines and devices: Secondary | ICD-10-CM | POA: Diagnosis not present

## 2018-09-20 DIAGNOSIS — I2583 Coronary atherosclerosis due to lipid rich plaque: Secondary | ICD-10-CM | POA: Diagnosis not present

## 2018-09-20 DIAGNOSIS — G4733 Obstructive sleep apnea (adult) (pediatric): Secondary | ICD-10-CM | POA: Diagnosis not present

## 2018-09-20 DIAGNOSIS — I2510 Atherosclerotic heart disease of native coronary artery without angina pectoris: Secondary | ICD-10-CM

## 2018-09-20 DIAGNOSIS — M2619 Other specified anomalies of jaw-cranial base relationship: Secondary | ICD-10-CM

## 2018-09-20 DIAGNOSIS — J342 Deviated nasal septum: Secondary | ICD-10-CM

## 2018-09-20 DIAGNOSIS — G629 Polyneuropathy, unspecified: Secondary | ICD-10-CM

## 2018-09-20 NOTE — Progress Notes (Signed)
[facility]  Provider:  Larey Seat, MD   Referring Provider: Pleas Koch, NP Primary Care Physician:  Pleas Koch, NP    OSA on CPAP     Gabriel [name] is a 84 y.o. male patient on CPAP is seen here on 02-20-20202, yearly revisit.   I have the pleasure of meeting with [name] today, who has actively lost weight and measured today on our scale with his winter clothing 210 pounds.  His home scale shows him about 205 pounds, his BMI is 28 or just below.  He continues to use CPAP with exemplary compliance also she also lost 30 days and 29 of those days with over 4 hours consecutively.  His average daily use of time is 7 hours and 41 minutes, the CPAP is a set pressure S9 Elite model and the EPR level is 3 cm he does have a residual apnea index of 5/h and does have more air leaks than he used to have in the past.  He has noticed that the air is leaking.  His machine is also over 81 years old and may be due for replacement. He is a side sleeper and uses medium or small nasal pillow, his wife has noted oral air leakage. He has year around head congestion. Retrognathia. He has a deviated septum. He did not like a FFM.    RV on 09-20-2017. Referred by NP Carlis Abbott , in cardiology.  He again has a 97% compliance , 7 hrs. And 32 minutes, at 6 cm H20 pressure, 3 cm EPR and a residual AI of 3.4 /hr. 1.6 of these obstructive, 1.5 unknown. 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    HPI: Gabriel [name] has been followed in this clinic since 2012 after he underwent a sleep study ( ordered by Dr. Mare Ferrari), on 04-19-11. At that time he has a past medical history of coronary artery disease, aortic valve disease, peripheral neuropathy and hypertension.  He had mostly complained of restless legs and his wife had witnessed apneas. The patient was diagnosed with an AHI of 24.9 and an RDI of 28.5. There was a clear accentuation of apnea during REM sleep - the  REM AHI being 48.9,  supine AHI was also elevated at 40 over the nonsupine AHI of of 14. Nadir of  oxygen saturation was 77% and 59 minutes of desaturations total time. The patient returned for a titration study and was titrated to 6 cm water pressure and 2 centimeter EPR. He had frequent sinusitis before CPAP use, and not since. He is not longer snoring. He may have one nocturia if any at all. Overall sleep duration is 7 hours at night , occasional naps in the afternoon.   09-18-14 CPAP download shows the patient using the machine 7 hours and 28 minutes nightly, with a residual AHI of 3.0 , a moderate air leak.  The patient uses a nasal pillow with heated humidity. He has a 100% compliance for CPAP use above 4 hours nightly user time.  Out of 90 days the machine was used 90 days, exemplary user.  His restless legs have been asymptomatic - he had PLMs , no longer RLS.   The pets states that he feels he can breathe much better once he starts using CPAP otherwise he often has an congested nose he also has trouble with repeated sinusitis infections.He has spoken to Dr. Wilburn Cornelia who diagnosed him with a deviated septum and wanted  me to discuss today with the patient as this could improve his overall nasal airflow as well as the CPAP compliance or tolerance.  I think the patient is obviously doing very well with his CPAP as is but he has discomfort and frequent  Nasal discharges he also complains of a postnasal drip. He could benefit indeed from an septal correction surgery and perhaps an adenoid trimming.  09-21-15 Patient here for yearly Rv on CPAP, compliance documentation. Gabriel [name] is seen here today and be discussed the compliance report for CPAP obtained in the office which includes last night's data. He uses the machine on average 7 hours and 28 minutes nightly has a 97% compliance for over 4 hours of daily use there was truly only one day over the last 30 days was user time was shorter than 4 hours.  His residual AHI is 4.4 he does have some significant air leaks and typically there is a higher residual AHI on week ends. That may be related to intake of alcohol, if they go out. He doesn't drink wine regularly and not at home.  No caffeine in the evening.   Interval history 09-20-2016 , Patient is 100% compliant and very happy with CPAP, he can't sleep without it.  His residual AHI was 3.0 at a setting of 6 cm water with 3 cm EPR, average user time 7 hours 49 minutes. He is using a nasal pillow, endorsed at 0 points on the geriatric depression score, 5 points on the Epworth Sleepiness Scale and fatigue severity scale was endorsed at 24 points all below average. 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    Review of Systems: 09-20-2018  Out of a complete 14 system review, the patient complains of only the following symptoms, and all other reviewed systems are negative. The patient endorsed the geriatric depression score at 0 points, the restless leg score again at mild degree of impairment.FSS at 17 and Epworth at 6 points again and again. Geriatric depression score 1/ 15.   Social History   Socioeconomic History  . Marital status: Married    Spouse name: [name]  . Number of children: 6  . Years of education: hs  . Highest education level: Not on file  Occupational History    Employer: RETIRED  Social Needs  . Financial resource strain: Not on file  . Food insecurity:    Worry: Not on file    Inability: Not on file  . Transportation needs:    Medical: Not on file    Non-medical: Not on file  Tobacco Use  . Smoking status: Former Research scientist (life sciences)  . Smokeless tobacco: Never Used  . Tobacco comment: 1968  Substance and Sexual Activity  . Alcohol use: No    Alcohol/week: 0.0 standard drinks    Comment: rarely  . Drug use: No  . Sexual activity: Yes  Lifestyle  . Physical activity:    Days per week: Not on file    Minutes per session: Not on file   . Stress: Not on file  Relationships  . Social connections:    Talks on phone: Not on file    Gets together: Not on file    Attends religious service: Not on file    Active member of club or organization: Not on file    Attends meetings of clubs or organizations: Not on file    Relationship status: Not on file  . Intimate partner violence:    Fear  of current or ex partner: Not on file    Emotionally abused: Not on file    Physically abused: Not on file    Forced sexual activity: Not on file  Other Topics Concern  . Not on file  Social History Narrative   Patient is married [name]).   Patient has four biological children and two adopted children. - twins , 74 grand children and 3 great grands.   Patient is retired from Electronics engineer.   Patient has a high school education.   Patient drinks coffee (12oz) daily.   Patient is right-handed.   Enjoys traveling, working on projects.              Family History  Problem Relation Age of Onset  . Heart Problems Mother   . Diabetes Mother   . Parkinson's disease Father 65  . Other Sister        Muscular degeneration  . Heart attack Neg Hx   . Stroke Neg Hx     Past Medical History:  Diagnosis Date  . 1st degree AV block   . Aortic valve disease    a. Mild AS, AI by echo 01/2014.  Marland Kitchen CAD (coronary artery disease)    a. s/p PTCA 1995. b. MI in 2003 s/p stenting to LAD 2003, s/p stent to Cx 2003. c. prior rotablator to PTCA vessel. d. Abnl nuc 01/2014: s/p DES to LAD, mild-mod nonobstructive disease otherwise.  . Carpal tunnel syndrome   . GERD (gastroesophageal reflux disease)   . Hyperlipidemia    a. Can only tolerate low dose statins.  . Hypertension   . Insomnia   . Ischemic cardiomyopathy    a. EF 40-45% by echo 02/04/14, 40-45% by cath 02/05/14 (similar to 2010).  . Kidney stone   . Neuropathy   . OSA (obstructive sleep apnea)    with upper airway resistancy  . RBBB   . Small intestinal bacterial  overgrowth   . Tubular adenoma of colon   . Vertigo    Had Epley maneuver, vestibular rehab.    Past Surgical History:  Procedure Laterality Date  . BELPHAROPTOSIS REPAIR Bilateral 05/30/2017  . CARDIAC CATHETERIZATION  2003   Following MI  . CATARACT EXTRACTION, BILATERAL  2004  . CHOLECYSTECTOMY  2009  . CORONARY ANGIOPLASTY  2003   LAD  . CORONARY STENT PLACEMENT  2003   LCX  . HERNIA REPAIR  1994   double  . KIDNEY STONE SURGERY  10/2005  . KNEE SURGERY Left   . LEFT HEART CATH  02/05/2014  . LEFT HEART CATHETERIZATION WITH CORONARY ANGIOGRAM N/A 02/05/2014   Procedure: LEFT HEART CATHETERIZATION WITH CORONARY ANGIOGRAM;  Surgeon: Jettie Booze, MD;  Location: [facility] CATH LAB;  Service: Cardiovascular;  Laterality: N/A;  . LITHOTRIPSY  10/2005  . meniscus removal- knee Right   . PERCUTANEOUS CORONARY STENT INTERVENTION (PCI-S) Right 02/05/2014   Procedure: PERCUTANEOUS CORONARY STENT INTERVENTION (PCI-S);  Surgeon: Jettie Booze, MD;  Location: [facility] CATH LAB;  Service: Cardiovascular;  Laterality: Right;  . SHOULDER OPEN ROTATOR CUFF REPAIR  1990    Current Outpatient Medications  Medication Sig Dispense Refill  . amLODipine (NORVASC) 5 MG tablet TAKE 1 TABLET BY MOUTH DAILY. 90 tablet 2  . aspirin EC 81 MG tablet Take 81 mg by mouth daily.    Marland Kitchen atorvastatin (LIPITOR) 10 MG tablet TAKE 1 TABLET BY MOUTH DAILY. 90 tablet 1  . cetirizine (ZYRTEC) 10 MG tablet Take 10 mg by  mouth as needed.     . clopidogrel (PLAVIX) 75 MG tablet Take 1 tablet (75 mg total) by mouth daily. 90 tablet 3  . Coenzyme Q10 (CO Q 10 PO) Take 200 mg by mouth daily.     . cyanocobalamin 1000 MCG tablet Take 1,000 mcg by mouth daily.    Marland Kitchen ezetimibe (ZETIA) 10 MG tablet TAKE 1 TABLET BY MOUTH DAILY. 90 tablet 3  . fluticasone (FLONASE) 50 MCG/ACT nasal spray Place 1 spray into both nostrils as needed.     . gabapentin (NEURONTIN) 800 MG tablet TAKE 1 TABLET BY MOUTH 3 TIMES DAILY. 270 tablet 3  .  lisinopril (PRINIVIL,ZESTRIL) 20 MG tablet Take 1 tablet (20 mg total) by mouth daily. 90 tablet 3  . Magnesium 500 MG TABS Take 500 mg by mouth daily.    . metoprolol succinate (TOPROL-XL) 25 MG 24 hr tablet TAKE 1 TABLET BY MOUTH DAILY. 90 tablet 3  . Multiple Vitamins-Minerals (MULTIVITAMINS THER. W/MINERALS) TABS Take 1 tablet by mouth daily.    . nitroGLYCERIN (NITROSTAT) 0.4 MG SL tablet Place 1 tablet (0.4 mg total) under the tongue every 5 (five) minutes as needed for chest pain. 30 tablet 1  . Omega-3 Fatty Acids (OMEGA 3 PO) Take 1 tablet by mouth daily.    . sildenafil (REVATIO) 20 MG tablet Take 2 tablets (40 mg total) by mouth daily as needed (erectile dysfunction). 50 tablet 0  . Wheat Dextrin (BENEFIBER PO) Take 5 g by mouth daily.    Marland Kitchen zolpidem (AMBIEN) 10 MG tablet TAKE 1 TABLET BY MOUTH AT BEDTIME AS NEEDED FOR SLEEP 90 tablet 0   No current facility-administered medications for this visit.     Allergies as of 09/20/2018 - Review Complete 09/20/2018  Allergen Reaction Noted  . Lipitor [atorvastatin calcium] Other (See Comments) 08/30/2010  . Mevacor [lovastatin] Other (See Comments) 08/30/2010    Vitals: BP 126/78   Pulse 76   Ht 6' 0.5" (1.842 m)   Wt 210 lb (95.3 kg)   BMI 28.09 kg/m  Last Weight:  Wt Readings from Last 1 Encounters:  09/20/18 210 lb (95.3 kg)   Last Height:   Ht Readings from Last 1 Encounters:  09/20/18 6' 0.5" (1.842 m)     Physical exam: none today , only vitals.  BMI 28   General: The patient is awake, alert and appears not in acute distress.  The patient is well groomed. Head: Normocephalic, atraumatic. Neck is supple. Mallampati 3, neck circumference:15.25  inches . Retrognathia.  Cardiovascular:  Regular rate and rhythm, without  murmurs or carotid bruit, and without distended neck veins. Respiratory: Lungs are clear to auscultation. Skin:  Without evidence of edema, or rash Trunk: BMI is 28, the patient has normal posture, no  scoliosis.  Neurologic exam : The patient is awake and alert, oriented to place and time. Memory subjective  described as intact. There is a normal attention span & concentration ability.  Speech is fluent without dysarthria, mild dysphonia - no aphasia.  Mood and affect are appropriate. DTR symmetric.  Finer to nose intact, no tremor, ataxia, dysmetria.  No falls.   Assessment:  20 minute face to face time dedicated to coordination of care and discussion of treatment alternatives. After physical and neurologic examination, review of laboratory studies, imaging, neurophysiology testing and pre-existing records, assessment is   1) OSA on CPAP- yearly visit, his residual apnea count is affected by air leaks.  Referral to Dr Wilburn Cornelia for nasal  septal deviation.  The patient will undergo a repeat sleep study in preparation for a new CPAP machine.  I will follow up on Dr Danie Binder consultation/  recommendation.     2) unchanged burning dysesthesias as the day progresses , mostly in feet. Numb fingertips has affected his ability to turn pages-  Neuropathy. Small fiber - not seen on NCV . He is not diabetic  No associated dysautonomia? He denies constipation, has noted orthostatic lightheadedness and he no longer sweats , even in summertime.   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 He was checked in [facility] by Dr. Carmelia Bake office, has bilateral carpal tunnel syndrome.  3) He has overcome Myalgia , attributed to statin use. He tolerates Lipitor 10 mg, co enzyme Q 10   Plan:  RV after sleep test repeat-  At this time no medical treatment of neuropathy.    Larey Seat, MD

## 2018-10-10 ENCOUNTER — Encounter: Payer: Self-pay | Admitting: Neurology

## 2018-10-18 ENCOUNTER — Telehealth: Payer: Medicare Other | Admitting: Family

## 2018-10-18 DIAGNOSIS — J301 Allergic rhinitis due to pollen: Secondary | ICD-10-CM | POA: Diagnosis not present

## 2018-10-18 DIAGNOSIS — H938X9 Other specified disorders of ear, unspecified ear: Secondary | ICD-10-CM | POA: Diagnosis not present

## 2018-10-18 MED ORDER — FLUTICASONE PROPIONATE 50 MCG/ACT NA SUSP: 2.0000 | Freq: Every day | NASAL | 2 refills | Status: DC

## 2018-10-18 NOTE — Progress Notes (Signed)
Greater than 5 minutes, yet less than 10 minutes of time have been spent researching, coordinating, and implementing care for this patient today.  Thank you for the details you included in the comment boxes. Those details are very helpful in determining the best course of treatment for you and help [place] to provide the best care.  I don't want to alarm you with the information here, but I will say that we are doing our best to treat these conditions REMOTELY given the virus outbreak. You are over age 60, so we really want to treat you remotely if we can, to avoid you sitting in a waiting room. That being said, if you have a nodule in your neck that is painful or red or makes it to where you struggle to swallow, you must go to a clinic ASAP. If it is just mildly swollen, could simply be a lymph node from a mild infection. See plan below.   Let's focus on relieving the congestion and treating your history of allergies in hopes that this will open up and allow some drainage and healing. If the ear pain worsens within 24-48 hours, we would need to examine your ears in person.   E visit for Allergic Rhinitis We are sorry that you are not feeling well.  Here is how we plan to help!  Based on what you have shared with me it looks like you have Allergic Rhinitis.  Rhinitis is when a reaction occurs that causes nasal congestion, runny nose, sneezing, and itching.  Most types of rhinitis are caused by an inflammation and are associated with symptoms in the eyes ears or throat. There are several types of rhinitis.  The most common are acute rhinitis, which is usually caused by a viral illness, allergic or seasonal rhinitis, and nonallergic or year-round rhinitis.  Nasal allergies occur certain times of the year.  Allergic rhinitis is caused when allergens in the air trigger the release of histamine in the body.  Histamine causes itching, swelling, and fluid to build up in the fragile linings of the nasal passages,  sinuses and eyelids.  An itchy nose and clear discharge are common.  I recommend the following over the counter treatments: Xyzal 5 mg take 1 tablet daily  I also would recommend a nasal spray: Flonase 2 sprays into each nostril once daily  You may also benefit from eye drops such as: Visine 1-2 drops each eye twice daily as needed  HOME CARE:   You can use an over-the-counter saline nasal spray as needed  Avoid areas where there is heavy dust, mites, or molds  Stay indoors on windy days during the pollen season  Keep windows closed in home, at least in bedroom; use air conditioner.  Use high-efficiency house air filter  Keep windows closed in car, turn AC on re-circulate  Avoid playing out with dog during pollen season  GET HELP RIGHT AWAY IF:   If your symptoms do not improve within 10 days  You become short of breath  You develop yellow or green discharge from your nose for over 3 days  You have coughing fits  MAKE SURE YOU:   Understand these instructions  Will watch your condition  Will get help right away if you are not doing well or get worse  Thank you for choosing an e-visit. Your e-visit answers were reviewed by a board certified advanced clinical practitioner to complete your personal care plan. Depending upon the condition, your plan could have  included both over the counter or prescription medications. Please review your pharmacy choice. Be sure that the pharmacy you have chosen is open so that you can pick up your prescription now.  If there is a problem you may message your provider in [place] to have the prescription routed to another pharmacy. Your safety is important to [place]. If you have drug allergies check your prescription carefully.  For the next 24 hours, you can use MyChart to ask questions about today's visit, request a non-urgent call back, or ask for a work or school excuse from your e-visit provider. You will get an email in the next two  days asking about your experience. I hope that your e-visit has been valuable and will speed your recovery.

## 2018-11-14 ENCOUNTER — Other Ambulatory Visit: Payer: Self-pay | Admitting: Primary Care

## 2018-11-14 DIAGNOSIS — G4700 Insomnia, unspecified: Secondary | ICD-10-CM

## 2018-11-14 NOTE — Telephone Encounter (Signed)
Noted, refill sent to pharmacy. 

## 2018-11-14 NOTE — Telephone Encounter (Signed)
Last prescribed on 08/17/2018  #90  with 0  refills. Last office visit on 02/23/2018. Next future appointment 02/28/2019 for AWV

## 2018-11-15 ENCOUNTER — Telehealth: Payer: Self-pay

## 2018-11-15 DIAGNOSIS — G4733 Obstructive sleep apnea (adult) (pediatric): Secondary | ICD-10-CM

## 2018-11-15 DIAGNOSIS — G629 Polyneuropathy, unspecified: Secondary | ICD-10-CM

## 2018-11-15 DIAGNOSIS — Z9989 Dependence on other enabling machines and devices: Principal | ICD-10-CM

## 2018-11-15 NOTE — Telephone Encounter (Signed)
Order for HST placed.

## 2018-11-15 NOTE — Telephone Encounter (Signed)
Can we get a HST order so patient does not have to wait for in lab sleep study?

## 2018-11-22 ENCOUNTER — Ambulatory Visit: Payer: Self-pay

## 2018-11-22 ENCOUNTER — Encounter: Payer: Self-pay | Admitting: Primary Care

## 2018-11-22 ENCOUNTER — Ambulatory Visit (INDEPENDENT_AMBULATORY_CARE_PROVIDER_SITE_OTHER): Payer: Medicare Other | Admitting: Primary Care

## 2018-11-22 DIAGNOSIS — R42 Dizziness and giddiness: Secondary | ICD-10-CM | POA: Diagnosis not present

## 2018-11-22 DIAGNOSIS — I2510 Atherosclerotic heart disease of native coronary artery without angina pectoris: Secondary | ICD-10-CM | POA: Diagnosis not present

## 2018-11-22 DIAGNOSIS — I2583 Coronary atherosclerosis due to lipid rich plaque: Secondary | ICD-10-CM

## 2018-11-22 NOTE — Progress Notes (Signed)
Subjective:    Patient ID: Gabriel [name], male    DOB: Oct 06, 1934, 84 y.o.   MRN: [id number]  HPI  Virtual Visit via Video Note  I connected with Gabriel [name] on 11/22/18 at 11:20 AM EDT by a video enabled telemedicine application and verified that I am speaking with the correct person using two identifiers.   I discussed the limitations of evaluation and management by telemedicine and the availability of in person appointments. The patient expressed understanding and agreed to proceed. He is at home, I am in the office.  History of Present Illness:  Gabriel [name] is an 84 year old male with a history of CAD, aortic valve disease, hypertension, 1st degree heart block, ischemic cardiomyopathy, chronic sinusitis, OSA, prediabetes, vertigo who presents today with a chief complaint of vertigo.  Last night he was participating in a [place] group and was praying with his head bowed and eyes closed for quite some time. When the prayer was over he lifted his head up and then noticed a significant amount of dizziness and felt as though the room was spinning. He had to sit still and focus on the pastor in order to reduce symptoms. This feels very similar to his prior episode of vertigo years ago. He was once following with ENT and had a copy of the Epley Maneuver so he tried this and it caused his symptoms to become worse. He has noticed allergy symptoms with his chronic sinus congestion over the last one month so he then tried some Flonase. He felt the Flonase reduce the congestion and then noticed improvement in dizziness. This morning he's noticed improvement but will notice dizziness with any movement.  He's complaint to his Zyrtec nightly. He's blowing clear mucous from his nasal cavity. He denies fevers, cough, shortness of breath.    Observations/Objective:  Appears well, not sickly. No distress. No cough.  Resting calmly.  Assessment and Plan:  Suspect vertigo  secondary to allergy/sinus involvement. He has no cardiac symptoms.  Doubt bacterial sinusitis given absence of other cardinal symptoms. Given that he's improving with Flonase we will continue this daily. Continue Zyrtec. Discussed use of Meclizine/Dramamine PRN with drowsiness precautions.  He will update.   Follow Up Instructions:  Continue using Flonase (fluticasone) nasal spray. Instill 1 spray in each nostril twice daily for congestion.  You may try Meclizine or Dramamine as discussed for vertigo symptoms, this may cause drowsiness.  Continue Zyrtec.  Please update me if you develop increased sinus pressure, you notice thick green mucous from your nasal cavity, your dizziness gets worse.  It was a pleasure to see you today! Gabriel Bossier, NP-C     I discussed the assessment and treatment plan with the patient. The patient was provided an opportunity to ask questions and all were answered. The patient agreed with the plan and demonstrated an understanding of the instructions.   The patient was advised to call back or seek an in-person evaluation if the symptoms worsen or if the condition fails to improve as anticipated.     Gabriel Koch, NP    Review of Systems  Constitutional: Negative for chills and fever.  HENT: Positive for congestion, postnasal drip and sinus pressure. Negative for ear pain and sore throat.   Respiratory: Negative for cough and shortness of breath.   Cardiovascular: Negative for chest pain.  Allergic/Immunologic: Positive for environmental allergies.  Neurological: Positive for dizziness.       Past Medical  History:  Diagnosis Date  . 1st degree AV block   . Aortic valve disease    a. Mild AS, AI by echo 01/2014.  Marland Kitchen CAD (coronary artery disease)    a. s/p PTCA 1995. b. MI in 2003 s/p stenting to LAD 2003, s/p stent to Cx 2003. c. prior rotablator to PTCA vessel. d. Abnl nuc 01/2014: s/p DES to LAD, mild-mod nonobstructive disease otherwise.   . Carpal tunnel syndrome   . GERD (gastroesophageal reflux disease)   . Hyperlipidemia    a. Can only tolerate low dose statins.  . Hypertension   . Insomnia   . Ischemic cardiomyopathy    a. EF 40-45% by echo 02/04/14, 40-45% by cath 02/05/14 (similar to 2010).  . Kidney stone   . Neuropathy   . OSA (obstructive sleep apnea)    with upper airway resistancy  . RBBB   . Small intestinal bacterial overgrowth   . Tubular adenoma of colon   . Vertigo    Had Epley maneuver, vestibular rehab.     Social History   Socioeconomic History  . Marital status: Married    Spouse name: Gabriel [name]  . Number of children: 6  . Years of education: hs  . Highest education level: Not on file  Occupational History    Employer: RETIRED  Social Needs  . Financial resource strain: Not on file  . Food insecurity:    Worry: Not on file    Inability: Not on file  . Transportation needs:    Medical: Not on file    Non-medical: Not on file  Tobacco Use  . Smoking status: Former Research scientist (life sciences)  . Smokeless tobacco: Never Used  . Tobacco comment: 1968  Substance and Sexual Activity  . Alcohol use: No    Alcohol/week: 0.0 standard drinks    Comment: rarely  . Drug use: No  . Sexual activity: Yes  Lifestyle  . Physical activity:    Days per week: Not on file    Minutes per session: Not on file  . Stress: Not on file  Relationships  . Social connections:    Talks on phone: Not on file    Gets together: Not on file    Attends religious service: Not on file    Active member of club or organization: Not on file    Attends meetings of clubs or organizations: Not on file    Relationship status: Not on file  . Intimate partner violence:    Fear of current or ex partner: Not on file    Emotionally abused: Not on file    Physically abused: Not on file    Forced sexual activity: Not on file  Other Topics Concern  . Not on file  Social History Narrative   Patient is married Gabriel [name]).   Patient has four  biological children and two adopted children.   Patient is retired from Electronics engineer.   Patient has a high school education.   Patient drinks coffee (12oz) daily.   Patient is right-handed.   Enjoys traveling, working on projects.              Past Surgical History:  Procedure Laterality Date  . BELPHAROPTOSIS REPAIR Bilateral 05/30/2017  . CARDIAC CATHETERIZATION  2003   Following MI  . CATARACT EXTRACTION, BILATERAL  2004  . CHOLECYSTECTOMY  2009  . CORONARY ANGIOPLASTY  2003   LAD  . CORONARY STENT PLACEMENT  2003   LCX  . [place]  double  . KIDNEY STONE SURGERY  10/2005  . KNEE SURGERY Left   . LEFT HEART CATH  02/05/2014  . LEFT HEART CATHETERIZATION WITH CORONARY ANGIOGRAM N/A 02/05/2014   Procedure: LEFT HEART CATHETERIZATION WITH CORONARY ANGIOGRAM;  Surgeon: Jettie Booze, MD;  Location: [facility] CATH LAB;  Service: Cardiovascular;  Laterality: N/A;  . LITHOTRIPSY  10/2005  . meniscus removal- knee Right   . PERCUTANEOUS CORONARY STENT INTERVENTION (PCI-S) Right 02/05/2014   Procedure: PERCUTANEOUS CORONARY STENT INTERVENTION (PCI-S);  Surgeon: Jettie Booze, MD;  Location: [facility] C[facility] LAB;  Service: Cardiovascular;  Laterality: Right;  . SHOULDER OPEN ROTATOR CUFF REPAIR  1990    Family History  Problem Relation Age of Onset  . Heart Problems Mother   . Diabetes Mother   . Parkinson's disease Father 58  . Other Sister        Muscular degeneration  . Heart attack Neg Hx   . Stroke Neg Hx     Allergies  Allergen Reactions  . Lipitor [Atorvastatin Calcium] Other (See Comments)    Muscle pain--Pt stated he can tolerate a low dose  . Mevacor [Lovastatin] Other (See Comments)    "makes me feel loopy"    Current Outpatient Medications on File Prior to Visit  Medication Sig Dispense Refill  . amLODipine (NORVASC) 5 MG tablet TAKE 1 TABLET BY MOUTH DAILY. 90 tablet 2  . aspirin EC 81 MG tablet Take 81 mg by mouth daily.    Marland Kitchen  atorvastatin (LIPITOR) 10 MG tablet TAKE 1 TABLET BY MOUTH DAILY. 90 tablet 1  . cetirizine (ZYRTEC) 10 MG tablet Take 10 mg by mouth as needed.     . clopidogrel (PLAVIX) 75 MG tablet Take 1 tablet (75 mg total) by mouth daily. 90 tablet 3  . Coenzyme Q10 (CO Q 10 PO) Take 200 mg by mouth daily.     . cyanocobalamin 1000 MCG tablet Take 1,000 mcg by mouth daily.    Marland Kitchen ezetimibe (ZETIA) 10 MG tablet TAKE 1 TABLET BY MOUTH DAILY. 90 tablet 3  . fluticasone (FLONASE) 50 MCG/ACT nasal spray Place 2 sprays into both nostrils daily. 16 g 2  . gabapentin (NEURONTIN) 800 MG tablet TAKE 1 TABLET BY MOUTH 3 TIMES DAILY. 270 tablet 3  . lisinopril (PRINIVIL,ZESTRIL) 20 MG tablet Take 1 tablet (20 mg total) by mouth daily. 90 tablet 3  . Magnesium 500 MG TABS Take 500 mg by mouth daily.    . metoprolol succinate (TOPROL-XL) 25 MG 24 hr tablet TAKE 1 TABLET BY MOUTH DAILY. 90 tablet 3  . Multiple Vitamins-Minerals (MULTIVITAMINS THER. W/MINERALS) TABS Take 1 tablet by mouth daily.    . nitroGLYCERIN (NITROSTAT) 0.4 MG SL tablet Place 1 tablet (0.4 mg total) under the tongue every 5 (five) minutes as needed for chest pain. 30 tablet 1  . Omega-3 Fatty Acids (OMEGA 3 PO) Take 1 tablet by mouth daily.    . sildenafil (REVATIO) 20 MG tablet Take 2 tablets (40 mg total) by mouth daily as needed (erectile dysfunction). 50 tablet 0  . Wheat Dextrin (BENEFIBER PO) Take 5 g by mouth daily.    Marland Kitchen zolpidem (AMBIEN) 10 MG tablet TAKE 1 TABLET BY MOUTH AT BEDTIME AS NEEDED FOR SLEEP 90 tablet 0   No current facility-administered medications on file prior to visit.     There were no vitals taken for this visit.   Objective:   Physical Exam  Constitutional: He is oriented to person, place, and  time. He appears well-nourished. He does not have a sickly appearance. He does not appear ill.  Respiratory: Effort normal.  Neurological: He is alert and oriented to person, place, and time.  Psychiatric: He has a normal mood  and affect.           Assessment & Plan:

## 2018-11-22 NOTE — Telephone Encounter (Signed)
   Reason for Disposition . [1] NO dizziness now AND [2] one or more stroke risk factors (i.e., hypertension, diabetes, prior stroke/TIA/heart attack)    HTN . [1] NO dizziness now AND [2] age > 12  Answer Assessment - Initial Assessment Questions 1. DESCRIPTION: "Describe your dizziness."     Felt like room was spinning "like a propeller" 2. VERTIGO: "Do you feel like either you or the room is spinning or tilting?"      yes 3. LIGHTHEADED: "Do you feel lightheaded?" (e.g., somewhat faint, woozy, weak upon standing)     no 4. SEVERITY: "How bad is it?"  "Can you walk?"   - MILD - Feels unsteady but walking normally.   - MODERATE - Feels very unsteady when walking, but not falling; interferes with normal activities (e.g., school, work) .   - SEVERE - Unable to walk without falling (requires assistance).     No having sx this morning 5. ONSET:  "When did the dizziness begin?"     Yes 6. AGGRAVATING FACTORS: "Does anything make it worse?" (e.g., standing, change in head position)    Turning around fast, watching TV  7. CAUSE: "What do you think is causing the dizziness?"     Sinusitis-wants a Z pack 8. RECURRENT SYMPTOM: "Have you had dizziness before?" If so, ask: "When was the last time?" "What happened that time?"     Yes-4 years ago- sinusitis and allergies  9. OTHER SYMPTOMS: "Do you have any other symptoms?" (e.g., headache, weakness, numbness, vomiting, earache)     Post nasal drip- 10. PREGNANCY: "Is there any chance you are pregnant?" "When was your last menstrual period?"      no  Protocols used: DIZZINESS - VERTIGO-A-AH

## 2018-11-22 NOTE — Telephone Encounter (Signed)
Pt stated that he was bending his head down during a prayer service and went he stood up he stated it looked like the room was spinning "like a propeller." Pt stated that he tried the exercises that was prescribed to balance his equilibrium and he became worse. Pt denies any weakness or numbness to face , arms, legs. Pt denies the vertigo this morning.  Pt did c/o post nasal drainage and sinusitis and has been taking Zyrtec at night for allergies.  Pt has been evaluated for this before.   Care advice given and pt verbalized understanding. Pt verified email. Pt informed the visit will be virtual. Called office 3 times and did not get an answer. Routing call to office to schedule appointment.

## 2018-11-22 NOTE — Assessment & Plan Note (Signed)
Suspect vertigo secondary to allergy/sinus involvement. He has no cardiac symptoms.  Doubt bacterial sinusitis given absence of other cardinal symptoms. Given that he's improving with Flonase we will continue this daily. Continue Zyrtec. Discussed use of Meclizine/Dramamine PRN with drowsiness precautions.  He will update.

## 2018-11-22 NOTE — Patient Instructions (Signed)
Continue using Flonase (fluticasone) nasal spray. Instill 1 spray in each nostril twice daily for congestion.  You may try Meclizine or Dramamine as discussed for vertigo symptoms, this may cause drowsiness.  Continue Zyrtec.  Please update me if you develop increased sinus pressure, you notice thick green mucous from your nasal cavity, your dizziness gets worse.  It was a pleasure to see you today! Allie Bossier, NP-C

## 2018-11-22 NOTE — Telephone Encounter (Signed)
I spoke with pt and if pt sits still no problem; if moves head to fast has the room spinning again. Pt scheduled virtual appt with Gentry Fitz NP 11/22/18.

## 2018-11-22 NOTE — Telephone Encounter (Signed)
Noted and evaluated. 

## 2018-11-26 ENCOUNTER — Ambulatory Visit (INDEPENDENT_AMBULATORY_CARE_PROVIDER_SITE_OTHER): Payer: Medicare Other | Admitting: Neurology

## 2018-11-26 ENCOUNTER — Other Ambulatory Visit: Payer: Self-pay

## 2018-11-26 DIAGNOSIS — G629 Polyneuropathy, unspecified: Secondary | ICD-10-CM

## 2018-11-26 DIAGNOSIS — G4733 Obstructive sleep apnea (adult) (pediatric): Secondary | ICD-10-CM | POA: Diagnosis not present

## 2018-11-26 DIAGNOSIS — Z9989 Dependence on other enabling machines and devices: Principal | ICD-10-CM

## 2018-11-28 ENCOUNTER — Other Ambulatory Visit: Payer: Self-pay | Admitting: Cardiovascular Disease

## 2018-11-29 ENCOUNTER — Telehealth: Payer: Self-pay | Admitting: Neurology

## 2018-11-29 NOTE — Telephone Encounter (Signed)
Called patient to discuss sleep study results. No answer at this time. LVM for the patient to call back.   

## 2018-11-29 NOTE — Telephone Encounter (Signed)
-----   Message from Larey Seat, MD sent at 11/29/2018  2:00 PM EDT ----- [name] still has sleep apnea, at an AHI of 26.2/h. I will order a new CPAP for him. No desaturation in oxygen noted.   Recommendations:     Auto CPAP 5-10 cm water, 3 cm EPR,  heated humidity and mask of patient's choice.    Electronically Signed:  Larey Seat, MD   11-27-2018

## 2018-11-29 NOTE — Procedures (Signed)
Patient Information     First Name: Gabriel Cislo" Last Name: Gabriel [name] ID: [id number]  Birth Date: 10/02/1934 Age: 84 Gender: Male  Referring Provider: Pleas Koch, NP BMI: 27.8 (W=205 lb, H=6' 0'')  Neck Circ.:  15.25" Epworth: 6/24   Sleep Study Information    Study Date: Nov 26, 2018 S/H/A Version: [id number] / 4.1.1528 / 23  History: Gabriel [name] has been followed in this clinic since 2012 after he underwent a sleep study (at the time ordered by Dr. Mare Ferrari), on 04-19-11. He has a history of coronary artery disease, aortic valve disease, peripheral neuropathy and hypertension. He had mostly complained of restless legs and his wife had witnessed apneas. The patient was diagnosed with an AHI of 24.9 and an RDI of 28.5. There was a clear accentuation of apnea during REM sleep - the REM AHI being 48.9, supine AHI was also elevated at 40 over the non-supine AHI of 14/h. Nadir of oxygen saturation was 77% and 59 minutes of desaturations total time. The patient returned for a titration study and was titrated to 6 cm water pressure and 2 centimeter EPR. He had frequent sinusitis before CPAP use, and not since. He is no longer snoring. He may have one nocturia if any at all. Overall sleep duration is 7 hours at night, occasional naps in the afternoon. He is ready for a re-evaluation.  Summary & Diagnosis:    [name] still has sleep apnea, at an AHI of 26.2/h. I will order a new CPAP for him. No desaturation in oxygen noted.   Recommendations:      Auto CPAP 5-10 cm water, 3 cm EPR,  heated humidity and mask of choice.    Electronically Signed:  Larey Seat, MD   11-27-2018             Sleep Summary  Oxygen Saturation Statistics   Start Study Time: End Study Time: Total Recording Time:  11:04:13 PM     7:04:04 AM   7 h, 59 min  Total Sleep Time % REM of Sleep Time:  6 h, 53 min  6.5    Mean:  94  Minimum:  87          Maximum: 98  Mean of Desaturations Nadirs (%): 92      Oxygen Desaturation. %: 4-9 10-20 >20 Total  Events Number Total  67 100.0  0 0.0  0 0.0  67 100.0  Oxygen Saturation: <90 <=88 <85 <80 <70  Duration (minutes): Sleep % 0.8 0.2 0.2 0.1 0.0 0.0 0.0 0.0 0.0 0.0     Respiratory Indices      Total Events REM NREM All Night  pRDI:  187  pAHI:  180 ODI:  67  pAHIc:  10  % CSR: 0.0 N/A N/A N/A N/A N/A N/A N/A N/A 27.2 26.2 9.8 1.5       Pulse Rate Statistics during Sleep (BPM)      Mean:  64  Minimum: N/A Maximum: 82    Indices are calculated using technically valid sleep time of 6 h, 52 min.  pRDI/pAHI are calculated using oxygen  desaturations ? 3% REM/NREM indices appear only if REM time >  30 min.  Body Position Statistics  Position Supine Prone Right Left Non-Supine  Sleep (min) 35.0 101.5 179.3 98.0 378.8  Sleep % 8.5 24.5 43.3 23.7 91.5  pRDI 68.6 25.0 16.8 33.8 23.4  pAHI 68.6 24.4 15.8 32.0 22.3  ODI 49.8 7.1 3.7 9.2  6.0     Snoring Statistics Snoring Level (dB) >40 >50 >60 >70 >80 >Threshold (45)  Sleep (min) 304.3 3.2 0.3 0.0 0.0 11.4  Sleep % 73.5 0.8 0.1 0.0 0.0 2.8    Mean: 41 dB Sleep Stages Chart                                        pAHI=26.2                                                    Mild              Moderate                    Severe                                                 5              15                    30

## 2018-11-29 NOTE — Addendum Note (Signed)
Addended by: Larey Seat on: 11/29/2018 02:00 PM   Modules accepted: Orders

## 2018-11-30 ENCOUNTER — Other Ambulatory Visit: Payer: Self-pay | Admitting: Cardiovascular Disease

## 2018-11-30 NOTE — Telephone Encounter (Signed)
Pt returned call and was informed that the office was closed on Friday's till further notice due to the COVID-19 Pandemic. Pt was informed that the RN will receive the message when they open up again on Monday morning.

## 2018-12-03 NOTE — Telephone Encounter (Signed)
I called pt. I advised pt that Dr. Brett Fairy reviewed their sleep study results and found that pt has sleep apnea. Dr. Brett Fairy recommends that pt starts auto CPAP. I reviewed PAP compliance expectations with the pt. Pt is agreeable to starting a CPAP. I advised pt that an order will be sent to a DME, [facility], and [facility] will call the pt within about one week after they file with the pt's insurance. [facility] will show the pt how to use the machine, fit for masks, and troubleshoot the CPAP if needed. A follow up appt was made for insurance purposes with Ward Givens, NP on July 14,2020 at 9 am. Pt verbalized understanding to arrive 15 minutes early and bring their CPAP. A letter with all of this information in it will be mailed to the pt as a reminder. I verified with the pt that the address we have on file is correct. Pt verbalized understanding of results. Pt had no questions at this time but was encouraged to call back if questions arise. I have sent the order to [facility] and have received confirmation that they have received the order.

## 2018-12-07 NOTE — Telephone Encounter (Signed)
Pt called the office stating that he spoke to [facility] to get an update on the Cpap that was to replace his old one and they informed him that there was no order for a Cpap machine or supplies for him. Please advise.

## 2018-12-10 NOTE — Telephone Encounter (Signed)
Order has been resent for the patient this morning. Im not sure what happened with it going through the first time.

## 2018-12-20 ENCOUNTER — Other Ambulatory Visit: Payer: Self-pay | Admitting: Neurology

## 2018-12-20 ENCOUNTER — Telehealth: Payer: Self-pay

## 2018-12-20 ENCOUNTER — Other Ambulatory Visit: Payer: Self-pay | Admitting: Cardiovascular Disease

## 2018-12-20 ENCOUNTER — Other Ambulatory Visit: Payer: Self-pay | Admitting: Primary Care

## 2018-12-20 DIAGNOSIS — G4700 Insomnia, unspecified: Secondary | ICD-10-CM

## 2018-12-20 MED ORDER — ATORVASTATIN CALCIUM 10 MG PO TABS: 10.0000 mg | ORAL_TABLET | Freq: Every day | ORAL | 1 refills | Status: DC

## 2018-12-20 MED ORDER — GABAPENTIN 800 MG PO TABS: 800.0000 mg | ORAL_TABLET | Freq: Three times a day (TID) | ORAL | 1 refills | Status: DC

## 2018-12-20 NOTE — Telephone Encounter (Signed)
Patient stated that his new pharmacy is  [facility] [address] [phone] (Phone) [phone] (Fax)   Which is where his gabapentin needs to be sent to so that they can have record of it. Patient stated that he doesn't need a refill until June 30

## 2018-12-20 NOTE — Telephone Encounter (Signed)
Pt's medication was sent to pt's pharmacy as requested. Confirmation received.  °

## 2018-12-20 NOTE — Telephone Encounter (Signed)
Pt called stating his insurance had him change drug stores Please send rx to [facility] [address] Zolpidem  Best number [phone]

## 2018-12-20 NOTE — Telephone Encounter (Signed)
Went ahead and sent a script for the patient to the pharmacy so they can place on file for him.

## 2018-12-20 NOTE — Telephone Encounter (Signed)
°*  STAT* If patient is at the pharmacy, call can be transferred to refill team.   1. Which medications need to be refilled? (please list name of each medication and dose if known)     ATORZA STATIN  10mg   2. Which pharmacy/location (including street and city if local pharmacy) is medication to be sent to? [place] [address] # [phone]    3. Do they need a 30 day or 90 day supply? [place]

## 2018-12-21 NOTE — Telephone Encounter (Signed)
Spoken to patient and he stated that he does not need a refill as of yet. He will call or send a message through [place]. [facility] inform him that they needed a new Rx to be sent.

## 2019-02-04 DIAGNOSIS — H9313 Tinnitus, bilateral: Secondary | ICD-10-CM | POA: Diagnosis not present

## 2019-02-04 DIAGNOSIS — Z9989 Dependence on other enabling machines and devices: Secondary | ICD-10-CM | POA: Diagnosis not present

## 2019-02-04 DIAGNOSIS — J342 Deviated nasal septum: Secondary | ICD-10-CM | POA: Diagnosis not present

## 2019-02-04 DIAGNOSIS — J343 Hypertrophy of nasal turbinates: Secondary | ICD-10-CM | POA: Diagnosis not present

## 2019-02-04 DIAGNOSIS — G4733 Obstructive sleep apnea (adult) (pediatric): Secondary | ICD-10-CM | POA: Diagnosis not present

## 2019-02-10 ENCOUNTER — Encounter: Payer: Self-pay | Admitting: Adult Health

## 2019-02-12 ENCOUNTER — Ambulatory Visit (INDEPENDENT_AMBULATORY_CARE_PROVIDER_SITE_OTHER): Payer: Medicare Other | Admitting: Adult Health

## 2019-02-12 ENCOUNTER — Encounter: Payer: Self-pay | Admitting: Adult Health

## 2019-02-12 ENCOUNTER — Other Ambulatory Visit: Payer: Self-pay | Admitting: Primary Care

## 2019-02-12 ENCOUNTER — Other Ambulatory Visit: Payer: Self-pay

## 2019-02-12 VITALS — BP 125/72 | HR 68 | Temp 97.1°F | Ht 72.5 in | Wt 215.4 lb

## 2019-02-12 DIAGNOSIS — I2583 Coronary atherosclerosis due to lipid rich plaque: Secondary | ICD-10-CM | POA: Diagnosis not present

## 2019-02-12 DIAGNOSIS — Z9989 Dependence on other enabling machines and devices: Secondary | ICD-10-CM | POA: Diagnosis not present

## 2019-02-12 DIAGNOSIS — G4733 Obstructive sleep apnea (adult) (pediatric): Secondary | ICD-10-CM

## 2019-02-12 DIAGNOSIS — I2510 Atherosclerotic heart disease of native coronary artery without angina pectoris: Secondary | ICD-10-CM

## 2019-02-12 DIAGNOSIS — G4700 Insomnia, unspecified: Secondary | ICD-10-CM

## 2019-02-12 MED ORDER — ZOLPIDEM TARTRATE 10 MG PO TABS: 10.0000 mg | ORAL_TABLET | Freq: Every evening | ORAL | 0 refills | Status: DC | PRN

## 2019-02-12 NOTE — Telephone Encounter (Signed)
Noted, refill sent to pharmacy. 

## 2019-02-12 NOTE — Telephone Encounter (Signed)
Patient is needing prescription refill sent in to a new pharmacy   [facility]   Patient is going out of town Saturday and will need a refill sent before he leaves  He has enough to get him through tomorrow when refill is normally due   [place]

## 2019-02-12 NOTE — Progress Notes (Signed)
PATIENT: Gabriel [name] DOB: [birth year]  REASON FOR VISIT: follow up HISTORY FROM: patient  HISTORY OF PRESENT ILLNESS: Today 02/12/19:  Gabriel [name] is an 84 year old male with a history of obstructive sleep apnea on CPAP.  He returns today for follow-up.  His CPAP download indicates that he uses machine nightly for compliance of 100%.  He uses machine greater than 4 hours each night.  On average he uses his machine 8 hours and 12 minutes.  His residual AHI is 2.2 on 5 to 10 cm of water with EPR of 3.  His leak in the 95th percentile is 21.1 L/min.  He reports that he continues to tolerate the CPAP well.  He has scheduled an appointment for consultation regarding surgery to fix his nasal septal deviation.  He returns today for follow-up.   REVIEW OF SYSTEMS: Out of a complete 14 system review of symptoms, the patient complains only of the following symptoms, and all other reviewed systems are negative.  See HPI  ALLERGIES: Allergies  Allergen Reactions  . Lipitor [Atorvastatin Calcium] Other (See Comments)    Muscle pain--Pt stated he can tolerate a low dose  . Mevacor [Lovastatin] Other (See Comments)    "makes me feel loopy"    HOME MEDICATIONS: Outpatient Medications Prior to Visit  Medication Sig Dispense Refill  . amLODipine (NORVASC) 5 MG tablet TAKE 1 TABLET BY MOUTH DAILY. 90 tablet 2  . aspirin EC 81 MG tablet Take 81 mg by mouth daily.    Marland Kitchen atorvastatin (LIPITOR) 10 MG tablet Take 1 tablet (10 mg total) by mouth daily. 90 tablet 1  . cetirizine (ZYRTEC) 10 MG tablet Take 10 mg by mouth as needed.     . clopidogrel (PLAVIX) 75 MG tablet TAKE 1 TABLET BY MOUTH EVERY DAY 90 tablet 1  . Coenzyme Q10 (CO Q 10 PO) Take 200 mg by mouth daily.     . cyanocobalamin 1000 MCG tablet Take 1,000 mcg by mouth daily.    Marland Kitchen ezetimibe (ZETIA) 10 MG tablet TAKE 1 TABLET BY MOUTH DAILY. 90 tablet 3  . fluticasone (FLONASE) 50 MCG/ACT nasal spray Place 2 sprays into both nostrils daily.  16 g 2  . gabapentin (NEURONTIN) 800 MG tablet Take 1 tablet (800 mg total) by mouth 3 (three) times daily. 270 tablet 1  . lisinopril (ZESTRIL) 20 MG tablet TAKE 1 TABLET BY MOUTH DAILY. 90 tablet 1  . Magnesium 500 MG TABS Take 500 mg by mouth daily.    . metoprolol succinate (TOPROL-XL) 25 MG 24 hr tablet TAKE 1 TABLET BY MOUTH DAILY. 90 tablet 3  . Multiple Vitamins-Minerals (MULTIVITAMINS THER. W/MINERALS) TABS Take 1 tablet by mouth daily.    . nitroGLYCERIN (NITROSTAT) 0.4 MG SL tablet Place 1 tablet (0.4 mg total) under the tongue every 5 (five) minutes as needed for chest pain. 30 tablet 1  . Omega-3 Fatty Acids (OMEGA 3 PO) Take 1 tablet by mouth daily.    . sildenafil (REVATIO) 20 MG tablet Take 2 tablets (40 mg total) by mouth daily as needed (erectile dysfunction). 50 tablet 0  . Wheat Dextrin (BENEFIBER PO) Take 5 g by mouth daily.    Marland Kitchen zolpidem (AMBIEN) 10 MG tablet TAKE 1 TABLET BY MOUTH AT BEDTIME AS NEEDED FOR SLEEP 90 tablet 0   No facility-administered medications prior to visit.     PAST MEDICAL HISTORY: Past Medical History:  Diagnosis Date  . 1st degree AV block   . Aortic  valve disease    a. Mild AS, AI by echo 01/2014.  Marland Kitchen CAD (coronary artery disease)    a. s/p PTCA 1995. b. MI in 2003 s/p stenting to LAD 2003, s/p stent to Cx 2003. c. prior rotablator to PTCA vessel. d. Abnl nuc 01/2014: s/p DES to LAD, mild-mod nonobstructive disease otherwise.  . Carpal tunnel syndrome   . GERD (gastroesophageal reflux disease)   . Hyperlipidemia    a. Can only tolerate low dose statins.  . Hypertension   . Insomnia   . Ischemic cardiomyopathy    a. EF 40-45% by echo 02/04/14, 40-45% by cath 02/05/14 (similar to 2010).  . Kidney stone   . Neuropathy   . OSA (obstructive sleep apnea)    with upper airway resistancy  . RBBB   . Small intestinal bacterial overgrowth   . Tubular adenoma of colon   . Vertigo    Had Epley maneuver, vestibular rehab.    PAST SURGICAL HISTORY:  Past Surgical History:  Procedure Laterality Date  . BELPHAROPTOSIS REPAIR Bilateral 05/30/2017  . CARDIAC CATHETERIZATION  2003   Following MI  . CATARACT EXTRACTION, BILATERAL  2004  . CHOLECYSTECTOMY  2009  . CORONARY ANGIOPLASTY  2003   LAD  . CORONARY STENT PLACEMENT  2003   LCX  . HERNIA REPAIR  1994   double  . KIDNEY STONE SURGERY  10/2005  . KNEE SURGERY Left   . LEFT HEART CATH  02/05/2014  . LEFT HEART CATHETERIZATION WITH CORONARY ANGIOGRAM N/A 02/05/2014   Procedure: LEFT HEART CATHETERIZATION WITH CORONARY ANGIOGRAM;  Surgeon: Jettie Booze, MD;  Location: [facility] C[facility] LAB;  Service: Cardiovascular;  Laterality: N/A;  . LITHOTRIPSY  10/2005  . meniscus removal- knee Right   . PERCUTANEOUS CORONARY STENT INTERVENTION (PCI-S) Right 02/05/2014   Procedure: PERCUTANEOUS CORONARY STENT INTERVENTION (PCI-S);  Surgeon: Jettie Booze, MD;  Location: [facility] CATH LAB;  Service: Cardiovascular;  Laterality: Right;  . SHOULDER OPEN ROTATOR CUFF REPAIR  1990    FAMILY HISTORY: Family History  Problem Relation Age of Onset  . Heart Problems Mother   . Diabetes Mother   . Parkinson's disease Father 61  . Other Sister        Muscular degeneration  . Heart attack Neg Hx   . Stroke Neg Hx     SOCIAL HISTORY: Social History   Socioeconomic History  . Marital status: Married    Spouse name: [name]  . Number of children: 6  . Years of education: hs  . Highest education level: Not on file  Occupational History    Employer: RETIRED  Social Needs  . Financial resource strain: Not on file  . Food insecurity    Worry: Not on file    Inability: Not on file  . Transportation needs    Medical: Not on file    Non-medical: Not on file  Tobacco Use  . Smoking status: Former Research scientist (life sciences)  . Smokeless tobacco: Never Used  . Tobacco comment: 1968  Substance and Sexual Activity  . Alcohol use: No    Alcohol/week: 0.0 standard drinks    Comment: rarely  . Drug use: No  . Sexual  activity: Yes  Lifestyle  . Physical activity    Days per week: Not on file    Minutes per session: Not on file  . Stress: Not on file  Relationships  . Social Herbalist on phone: Not on file    Gets together: Not  on file    Attends religious service: Not on file    Active member of club or organization: Not on file    Attends meetings of clubs or organizations: Not on file    Relationship status: Not on file  . Intimate partner violence    Fear of current or ex partner: Not on file    Emotionally abused: Not on file    Physically abused: Not on file    Forced sexual activity: Not on file  Other Topics Concern  . Not on file  Social History Narrative   Patient is married [name]).   Patient has four biological children and two adopted children.   Patient is retired from Electronics engineer.   Patient has a high school education.   Patient drinks coffee (12oz) daily.   Patient is right-handed.   Enjoys traveling, working on projects.                PHYSICAL EXAM  Vitals:   02/12/19 0850  BP: 125/72  Pulse: 68  Temp: (!) 97.1 F (36.2 C)  TempSrc: Oral  Weight: 215 lb 6.4 oz (97.7 kg)  Height: 6' 0.5" (1.842 m)   Body mass index is 28.81 kg/m.  Generalized: Well developed, in no acute distress  CHest: Lungs clear to auscultation bilaterally  Neurological examination  Mentation: Alert oriented to time, place, history taking. Follows all commands speech and language fluent Cranial nerve II-XII. Extraocular movements were full, visual field were full on confrontational test. Facial sensation and strength were normal. Head turning and shoulder shrug  were normal and symmetric. Motor: The motor testing reveals 5 over 5 strength of all 4 extremities. Good symmetric motor tone is noted throughout.  Sensory: Sensory testing is intact to soft touch on all 4 extremities. No evidence of extinction is noted.  Gait and station: Gait is normal.     DIAGNOSTIC DATA (LABS, IMAGING, TESTING) - I reviewed patient records, labs, notes, testing and imaging myself where available.  Lab Results  Component Value Date   WBC 7.1 10/20/2017   HGB 11.9 (L) 10/20/2017   HCT 34.3 (L) 10/20/2017   MCV 100.9 (H) 10/20/2017   PLT 206 10/20/2017      Component Value Date/Time   NA 138 08/07/2018 0815   K 4.6 08/07/2018 0815   CL 102 08/07/2018 0815   CO2 23 08/07/2018 0815   GLUCOSE 109 (H) 08/07/2018 0815   GLUCOSE 114 (H) 10/20/2017 0423   BUN 19 08/07/2018 0815   CREATININE 1.23 08/07/2018 0815   CREATININE 1.12 04/06/2016 0920   CALCIUM 9.0 08/07/2018 0815   PROT 6.7 08/07/2018 0815   ALBUMIN 4.0 08/07/2018 0815   AST 21 08/07/2018 0815   ALT 22 08/07/2018 0815   ALKPHOS 52 08/07/2018 0815   BILITOT 0.6 08/07/2018 0815   GFRNONAA 55 (L) 08/07/2018 0815   GFRAA 63 08/07/2018 0815   Lab Results  Component Value Date   CHOL 155 08/07/2018   HDL 50 08/07/2018   LDLCALC 70 08/07/2018   LDLDIRECT 145.5 09/16/2013   TRIG 175 (H) 08/07/2018   CHOLHDL 3.1 08/07/2018   Lab Results  Component Value Date   HGBA1C 5.8 (A) 08/27/2018      ASSESSMENT AND PLAN 84 y.o. year old male  has a past medical history of 1st degree AV block, Aortic valve disease, CAD (coronary artery disease), Carpal tunnel syndrome, GERD (gastroesophageal reflux disease), Hyperlipidemia, Hypertension, Insomnia, Ischemic cardiomyopathy, Kidney stone, Neuropathy, OSA (obstructive sleep apnea),  RBBB, Small intestinal bacterial overgrowth, Tubular adenoma of colon, and Vertigo. here with:  1. OSA on CPAP  The patient's CPAP download shows excellent compliance and good treatment of his apnea.  He is encouraged to continue using CPAP nightly and greater than 4 hours each night.  He is advised that if his symptoms worsen or he develops new symptoms he should let us know.  He will follow-up in 6 months or sooner if needed    I spent 15 minutes with the patient. 50%  of this time was spent reviewing CPAP download   Ward Givens, MSN, NP-C 02/12/2019, 9:10 AM [facility] [address] [place], [place] [address] [phone]

## 2019-02-12 NOTE — Patient Instructions (Signed)
Continue using CPAP nightly and greater than 4 hours each night °If your symptoms worsen or you develop new symptoms please let us know.  ° °

## 2019-02-12 NOTE — Telephone Encounter (Signed)
zolpidem (AMBIEN) 10 MG tablet Last prescribed on 11/14/2018. Last seen on 11/22/2018. Next appointment on 03/01/2019

## 2019-02-21 ENCOUNTER — Telehealth: Payer: Self-pay | Admitting: Primary Care

## 2019-02-21 NOTE — Telephone Encounter (Signed)
lvm asking patient to call office. Need to discuss making 7/30 appt a lab only appt. Pt will need to get paperwork during this visit to bring back for visit with Anda Kraft.

## 2019-02-28 ENCOUNTER — Other Ambulatory Visit (INDEPENDENT_AMBULATORY_CARE_PROVIDER_SITE_OTHER): Payer: Medicare Other

## 2019-02-28 ENCOUNTER — Other Ambulatory Visit: Payer: Self-pay | Admitting: Primary Care

## 2019-02-28 ENCOUNTER — Ambulatory Visit: Payer: Medicare Other

## 2019-02-28 DIAGNOSIS — I10 Essential (primary) hypertension: Secondary | ICD-10-CM

## 2019-02-28 DIAGNOSIS — E7800 Pure hypercholesterolemia, unspecified: Secondary | ICD-10-CM | POA: Diagnosis not present

## 2019-02-28 DIAGNOSIS — R7303 Prediabetes: Secondary | ICD-10-CM

## 2019-02-28 LAB — LIPID PANEL
Cholesterol: 155 mg/dL (ref 0–200)
HDL: 48.8 mg/dL (ref >39.00)
LDL Cholesterol: 71 mg/dL (ref 0–99)
NonHDL: 106.11
Total CHOL/HDL Ratio: 3
Triglycerides: 175 mg/dL — ABNORMAL HIGH (ref 0.0–149.0)
VLDL: 35 mg/dL (ref 0.0–40.0)

## 2019-02-28 LAB — COMPREHENSIVE METABOLIC PANEL
ALT: 20 U/L (ref 0–53)
AST: 22 U/L (ref 0–37)
Albumin: 4.3 g/dL (ref 3.5–5.2)
Alkaline Phosphatase: 43 U/L (ref 39–117)
BUN: 20 mg/dL (ref 6–23)
CO2: 29 mEq/L (ref 19–32)
Calcium: 9.2 mg/dL (ref 8.4–10.5)
Chloride: 101 mEq/L (ref 96–112)
Creatinine, Ser: 1.37 mg/dL (ref 0.40–1.50)
GFR: 49.68 mL/min — ABNORMAL LOW (ref >60.00)
Glucose, Bld: 108 mg/dL — ABNORMAL HIGH (ref 70–99)
Potassium: 4.6 mEq/L (ref 3.5–5.1)
Sodium: 137 mEq/L (ref 135–145)
Total Bilirubin: 0.8 mg/dL (ref 0.2–1.2)
Total Protein: 7 g/dL (ref 6.0–8.3)

## 2019-02-28 LAB — HEMOGLOBIN A1C: Hgb A1c MFr Bld: 6.1 % (ref 4.6–6.5)

## 2019-03-01 ENCOUNTER — Ambulatory Visit (INDEPENDENT_AMBULATORY_CARE_PROVIDER_SITE_OTHER): Payer: Medicare Other | Admitting: Primary Care

## 2019-03-01 ENCOUNTER — Encounter: Payer: Self-pay | Admitting: Primary Care

## 2019-03-01 ENCOUNTER — Other Ambulatory Visit: Payer: Self-pay

## 2019-03-01 VITALS — BP 118/70 | HR 68 | Temp 98.0°F | Ht 72.5 in | Wt 211.5 lb

## 2019-03-01 DIAGNOSIS — G4733 Obstructive sleep apnea (adult) (pediatric): Secondary | ICD-10-CM

## 2019-03-01 DIAGNOSIS — Z9989 Dependence on other enabling machines and devices: Secondary | ICD-10-CM

## 2019-03-01 DIAGNOSIS — I10 Essential (primary) hypertension: Secondary | ICD-10-CM

## 2019-03-01 DIAGNOSIS — I2510 Atherosclerotic heart disease of native coronary artery without angina pectoris: Secondary | ICD-10-CM | POA: Diagnosis not present

## 2019-03-01 DIAGNOSIS — J322 Chronic ethmoidal sinusitis: Secondary | ICD-10-CM | POA: Diagnosis not present

## 2019-03-01 DIAGNOSIS — Z0000 Encounter for general adult medical examination without abnormal findings: Secondary | ICD-10-CM | POA: Diagnosis not present

## 2019-03-01 DIAGNOSIS — G629 Polyneuropathy, unspecified: Secondary | ICD-10-CM | POA: Diagnosis not present

## 2019-03-01 DIAGNOSIS — R7303 Prediabetes: Secondary | ICD-10-CM

## 2019-03-01 DIAGNOSIS — E7800 Pure hypercholesterolemia, unspecified: Secondary | ICD-10-CM

## 2019-03-01 DIAGNOSIS — I359 Nonrheumatic aortic valve disorder, unspecified: Secondary | ICD-10-CM | POA: Diagnosis not present

## 2019-03-01 NOTE — Progress Notes (Signed)
Patient ID: Gabriel [name], male   DOB: 1934-08-02, 84 y.o.   MRN: [id number]  Gabriel [name] is a 84 year old male who presents today for MWV.  HPI:  Past Medical History:  Diagnosis Date  . 1st degree AV block   . Aortic valve disease    a. Mild AS, AI by echo 01/2014.  Marland Kitchen CAD (coronary artery disease)    a. s/p PTCA 1995. b. MI in 2003 s/p stenting to LAD 2003, s/p stent to Cx 2003. c. prior rotablator to PTCA vessel. d. Abnl nuc 01/2014: s/p DES to LAD, mild-mod nonobstructive disease otherwise.  . Carpal tunnel syndrome   . GERD (gastroesophageal reflux disease)   . Hyperlipidemia    a. Can only tolerate low dose statins.  . Hypertension   . Insomnia   . Ischemic cardiomyopathy    a. EF 40-45% by echo 02/04/14, 40-45% by cath 02/05/14 (similar to 2010).  . Kidney stone   . Neuropathy   . OSA (obstructive sleep apnea)    with upper airway resistancy  . RBBB   . Small intestinal bacterial overgrowth   . Tubular adenoma of colon   . Vertigo    Had Epley maneuver, vestibular rehab.    Current Outpatient Medications  Medication Sig Dispense Refill  . amLODipine (NORVASC) 5 MG tablet TAKE 1 TABLET BY MOUTH DAILY. 90 tablet 2  . aspirin EC 81 MG tablet Take 81 mg by mouth daily.    Marland Kitchen atorvastatin (LIPITOR) 10 MG tablet Take 1 tablet (10 mg total) by mouth daily. 90 tablet 1  . cetirizine (ZYRTEC) 10 MG tablet Take 10 mg by mouth as needed.     . clopidogrel (PLAVIX) 75 MG tablet TAKE 1 TABLET BY MOUTH EVERY DAY 90 tablet 1  . Coenzyme Q10 (CO Q 10 PO) Take 200 mg by mouth daily.     . cyanocobalamin 1000 MCG tablet Take 1,000 mcg by mouth daily.    Marland Kitchen ezetimibe (ZETIA) 10 MG tablet TAKE 1 TABLET BY MOUTH DAILY. 90 tablet 3  . fluticasone (FLONASE) 50 MCG/ACT nasal spray Place 2 sprays into both nostrils daily. 16 g 2  . gabapentin (NEURONTIN) 800 MG tablet Take 1 tablet (800 mg total) by mouth 3 (three) times daily. 270 tablet 1  . lisinopril (ZESTRIL) 20 MG tablet TAKE 1 TABLET BY MOUTH  DAILY. 90 tablet 1  . Magnesium 500 MG TABS Take 500 mg by mouth daily.    . metoprolol succinate (TOPROL-XL) 25 MG 24 hr tablet TAKE 1 TABLET BY MOUTH DAILY. 90 tablet 3  . Multiple Vitamins-Minerals (MULTIVITAMINS THER. W/MINERALS) TABS Take 1 tablet by mouth daily.    . nitroGLYCERIN (NITROSTAT) 0.4 MG SL tablet Place 1 tablet (0.4 mg total) under the tongue every 5 (five) minutes as needed for chest pain. 30 tablet 1  . Omega-3 Fatty Acids (OMEGA 3 PO) Take 1 tablet by mouth daily.    . sildenafil (REVATIO) 20 MG tablet Take 2 tablets (40 mg total) by mouth daily as needed (erectile dysfunction). 50 tablet 0  . Wheat Dextrin (BENEFIBER PO) Take 5 g by mouth daily.    Marland Kitchen zolpidem (AMBIEN) 10 MG tablet Take 1 tablet (10 mg total) by mouth at bedtime as needed. for sleep 90 tablet 0   No current facility-administered medications for this visit.     Allergies  Allergen Reactions  . Lipitor [Atorvastatin Calcium] Other (See Comments)    Muscle pain--Pt stated he can tolerate a  low dose  . Mevacor [Lovastatin] Other (See Comments)    "makes me feel loopy"    Family History  Problem Relation Age of Onset  . Heart Problems Mother   . Diabetes Mother   . Parkinson's disease Father 40  . Other Sister        Muscular degeneration  . Heart attack Neg Hx   . Stroke Neg Hx     Social History   Socioeconomic History  . Marital status: Married    Spouse name: [name]  . Number of children: 6  . Years of education: hs  . Highest education level: Not on file  Occupational History    Employer: RETIRED  Social Needs  . Financial resource strain: Not on file  . Food insecurity    Worry: Not on file    Inability: Not on file  . Transportation needs    Medical: Not on file    Non-medical: Not on file  Tobacco Use  . Smoking status: Former Research scientist (life sciences)  . Smokeless tobacco: Never Used  . Tobacco comment: 1968  Substance and Sexual Activity  . Alcohol use: No    Alcohol/week: 0.0 standard  drinks    Comment: rarely  . Drug use: No  . Sexual activity: Yes  Lifestyle  . Physical activity    Days per week: Not on file    Minutes per session: Not on file  . Stress: Not on file  Relationships  . Social Herbalist on phone: Not on file    Gets together: Not on file    Attends religious service: Not on file    Active member of club or organization: Not on file    Attends meetings of clubs or organizations: Not on file    Relationship status: Not on file  . Intimate partner violence    Fear of current or ex partner: Not on file    Emotionally abused: Not on file    Physically abused: Not on file    Forced sexual activity: Not on file  Other Topics Concern  . Not on file  Social History Narrative   Patient is married [name]).   Patient has four biological children and two adopted children.   Patient is retired from Electronics engineer.   Patient has a high school education.   Patient drinks coffee (12oz) daily.   Patient is right-handed.   Enjoys traveling, working on projects.              Hospitiliaztions: None  Health Maintenance:    Flu: Due this season   Tetanus: Completed in 2013  Pneumovax: Completed in 2015  Prevnar: Completed in 2017  Zostavax: Never completed, never had chicken pox.  Colonoscopy: Completed in 2018, no repeat needed  Eye Doctor: Completes annually  PSA: 0.70 in 2019    Providers: Alma Friendly, PCP; Dr, Claudina Lick, Neurology; Dr. Cathie Olden, Cardiology    I have personally reviewed and have noted: 1. The patient's medical and social history 2. Their use of alcohol, tobacco or illicit drugs 3. Their current medications and supplements 4. The patient's functional ability including ADL's, fall risks, home  safety risks and hearing or visual impairment. 5. Diet and physical activities 6. Evidence for depression or mood disorder  Subjective:   Review of Systems:   Constitutional: Denies fever, malaise,  fatigue, headache or abrupt weight changes.  HEENT: Denies eye pain, eye redness, ear pain, ringing in the ears, wax buildup, runny nose, nasal congestion,  bloody nose, or sore throat. Respiratory: Denies difficulty breathing, shortness of breath, cough or sputum production.   Cardiovascular: Denies chest pain, chest tightness, palpitations or swelling in the hands or feet.  Gastrointestinal: Denies abdominal pain, bloating, constipation, diarrhea or blood in the stool.  GU: Denies urgency, frequency, pain with urination, burning sensation, blood in urine, odor or discharge. Musculoskeletal: Denies decrease in range of motion, difficulty with gait. Intermittent right lower back pain with radiation down right lower extremity, improves with Tylenol. Chronic left knee pain. Skin: Denies redness, rashes, lesions or ulcercations. Followed with Dr. Ronnald Ramp with Dermatology. Neurological: Denies difficulty with memory, difficulty with speech or problems with balance and coordination. Chronic lightheadedness, seeing ENT and will undergo deviated septum procedure.  Psychiatric: Denies concerns for anxiety or depression. Doing well on Ambien for sleep.  No other specific complaints in a complete review of systems (except as listed in HPI above).  Objective:  PE:   BP 118/70   Pulse 68   Temp 98 F (36.7 C) (Temporal)   Ht 6' 0.5" (1.842 m)   Wt 211 lb 8 oz (95.9 kg)   SpO2 97%   BMI 28.29 kg/m  Wt Readings from Last 3 Encounters:  03/01/19 211 lb 8 oz (95.9 kg)  02/12/19 215 lb 6.4 oz (97.7 kg)  09/20/18 210 lb (95.3 kg)    General: Appears their stated age, well developed, well nourished in NAD. Skin: Warm, dry and intact. No rashes, lesions or ulcerations noted. HEENT: Head: normal shape and size; Eyes: sclera white, no icterus, conjunctiva pink, PERRLA and EOMs intact; Ears: Tm's gray and intact, normal light reflex; Nose: mucosa pink and moist, septum midline; Throat/Mouth: Teeth present,  mucosa pink and moist, no exudate, lesions or ulcerations noted.  Neck: Normal range of motion. Neck supple, trachea midline. No massses, lumps or thyromegaly present.  Cardiovascular: Normal rate and rhythm. S1,S2 noted. No JVD or BLE edema. No carotid bruits noted. Pulmonary/Chest: Normal effort and positive vesicular breath sounds. No respiratory distress. No wheezes, rales or ronchi noted.  Abdomen: Soft and nontender. Normal bowel sounds, no bruits noted. No distention or masses noted. Liver, spleen and kidneys non palpable. Musculoskeletal: Normal range of motion. No signs of joint swelling. No difficulty with gait.  Neurological: Alert and oriented. Cranial nerves II-XII intact. Coordination normal. +DTRs bilaterally. Psychiatric: Mood and affect normal. Behavior is normal. Judgment and thought content normal.    BMET    Component Value Date/Time   NA 137 02/28/2019 1442   NA 138 08/07/2018 0815   K 4.6 02/28/2019 1442   CL 101 02/28/2019 1442   CO2 29 02/28/2019 1442   GLUCOSE 108 (H) 02/28/2019 1442   BUN 20 02/28/2019 1442   BUN 19 08/07/2018 0815   CREATININE 1.37 02/28/2019 1442   CREATININE 1.12 04/06/2016 0920   CALCIUM 9.2 02/28/2019 1442   GFRNONAA 55 (L) 08/07/2018 0815   GFRAA 63 08/07/2018 0815    Lipid Panel     Component Value Date/Time   CHOL 155 02/28/2019 1442   CHOL 155 08/07/2018 0815   TRIG 175.0 (H) 02/28/2019 1442   HDL 48.80 02/28/2019 1442   HDL 50 08/07/2018 0815   CHOLHDL 3 02/28/2019 1442   VLDL 35.0 02/28/2019 1442   LDLCALC 71 02/28/2019 1442   LDLCALC 70 08/07/2018 0815    CBC    Component Value Date/Time   WBC 7.1 10/20/2017 0423   RBC 3.40 (L) 10/20/2017 0423   HGB 11.9 (L) 10/20/2017  0423   HCT 34.3 (L) 10/20/2017 0423   PLT 206 10/20/2017 0423   MCV 100.9 (H) 10/20/2017 0423   MCH 35.0 (H) 10/20/2017 0423   MCHC 34.7 10/20/2017 0423   RDW 12.8 10/20/2017 0423   LYMPHSABS 1.4 10/18/2017 0212   MONOABS 1.4 (H) 10/18/2017  0212   EOSABS 0.0 10/18/2017 0212   BASOSABS 0.0 10/18/2017 0212    Hgb A1C Lab Results  Component Value Date   HGBA1C 6.1 02/28/2019      Assessment and Plan:   Medicare Annual Wellness Visit:  Diet: He endorses a fair diet. Eats meat, vegetable, starch. Drinking mostly water with flavor, coffee, occasional tea. Physical activity: Active at home, no regular exercise. Depression/mood screen: Negative Hearing: Intact to whispered voice Visual acuity: Grossly normal, performs annual eye exam  ADLs: Capable Fall risk: None Home safety: Good Cognitive evaluation: Intact to orientation, naming, recall and repetition EOL planning: Adv directives completed.  Preventative Medicine: Immunizations up-to-date.  He declines shingles vaccination given lack of chickenpox history.  Colonoscopy up-to-date.  PSA up-to-date.  Encouraged a healthy diet with regular exercise.  Advanced directives complete.  All recommendations provided at end of visit.  Exam stable, labs reviewed.  Next appointment: 1 year.

## 2019-03-01 NOTE — Assessment & Plan Note (Signed)
Doing well on gabapentin 800 mg 3 times daily. Follow with neurology.

## 2019-03-01 NOTE — Assessment & Plan Note (Signed)
Compliant to CPAP, following with neurology.

## 2019-03-01 NOTE — Assessment & Plan Note (Signed)
Immunizations up-to-date.  He declines shingles vaccination given lack of chickenpox history.  Colonoscopy up-to-date.  PSA up-to-date.  Encouraged a healthy diet with regular exercise.  Advanced directives complete.  All recommendations provided at end of visit.  Exam stable, labs reviewed.  I have personally reviewed and have noted: 1. The patient's medical and social history 2. Their use of alcohol, tobacco or illicit drugs 3. Their current medications and supplements 4. The patient's functional ability including ADL's, fall risks, home  safety risks and hearing or visual impairment. 5. Diet and physical activities 6. Evidence for depression or mood disorder

## 2019-03-01 NOTE — Assessment & Plan Note (Signed)
Will be undergoing deviated septal surgery soon. Following with ENT.

## 2019-03-01 NOTE — Assessment & Plan Note (Signed)
Stable in the office today, continue current regimen.  BMP reviewed. 

## 2019-03-01 NOTE — Assessment & Plan Note (Signed)
Murmur noted on exam. Echocardiogram from 2019 without changes. Following with cardiology.

## 2019-03-01 NOTE — Assessment & Plan Note (Signed)
Recent LDL at goal, continue statin and Zetia.

## 2019-03-01 NOTE — Assessment & Plan Note (Signed)
Chronic and stable. Repeat A1c of 6.1. Encouraged healthy diet and regular exercise. Continue to monitor annually.

## 2019-03-01 NOTE — Patient Instructions (Signed)
Start exercising. You should be getting 150 minutes of exercise weekly.  Continue to work on a healthy diet, make sure to eat plenty of vegetables, fruit, whole grains, lean protein.  Ensure you are consuming 64 ounces of water daily.  Schedule a lab only appointment for 2 months to repeat kidney function.  It was a pleasure to see you today!

## 2019-03-01 NOTE — Assessment & Plan Note (Addendum)
Asymptomatic, no recent use of nitroglycerin. Managed by cardiology. Repeat stress test from 2019 reviewed and is without changes. Continue statin therapy, blood pressure control, clopidogrel.

## 2019-03-27 ENCOUNTER — Other Ambulatory Visit: Payer: Medicare Other

## 2019-03-28 NOTE — Progress Notes (Signed)
Cardiology Office Note   Date:  03/29/2019   ID:  Gabriel [name], DOB 1935-04-17, MRN [id number]  PCP:  Pleas Koch, NP  Cardiologist:   Mertie Moores, MD , previous [place] patient   Chief Complaint  Patient presents with  . Coronary Artery Disease   Problem list 1. Coronary artery disease 2. Mild systolic congestive heart failure due to coronary artery disease 3. Essential hypertension 4. Hyperlipidemia 5. Obstructive sleep apnea 6. Abdominal bloating      Gabriel [name] is a 84 y.o. male who presents for his CAD  I preformed several PCIs in the 1995s - 2000  Had a recent PCI in July 2015.   ( mid LAD)   Not getting much regular exercise .    Cath in July 2015:  IMPRESSIONS:  1. Normal left main coronary artery. 2. 90% focal stenosis in the mid left anterior descending artery. This was successfully treated with a 2.25 x 12 promus drug-eluting stent, postdilated to 2.6 mm in diameter. Previously placed stent in the more proximal portion of the LAD is widely patent. 3. Patent stents in the mid left circumflex artery. Mild to moderate disease in the circumflex system. 4. Mild to moderate disease right coronary artery system. The distal RCA stent has mild in-stent restenosis. 5. Mildly decreased left ventricular systolic function. LVEDP 19 mmHg. Ejection fraction 40-45%.  Sept. 6, 2017:  Doing well.   Had left knee arthroscopy recently.  No complications. No CP , no dyspnea BP is well controlled   October 17, 2016:  Doing well His knee is feeling better  Recently went to [place] to spring training   Sept. 27, 2018:  Doing well Hx of stenting  No CP , no dyspnea.   Stays busy,  remodling the activity building at church.  Not as much aerobic exercise Needs a knee replacement.   Able to do some walking  Encouraged him to use an exercise   November 23, 2016:  Gabriel [name] was in the hospital for 4 days with pneumonia.  He still feels fairly fatigued.  His  breathing has improved. 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   February 14, 2018: There is seen back for follow-up visit.  He was having some episodes of chest discomfort when I saw him in April. Myoview study revealed a previous inferolateral apical myocardial infarction.  There was no evidence of ischemia.  Ejection fraction is 41%. Echocardiogram shows left ventricular systolic function with an EF of 35 to 40%.  He has grade 1 diastolic dysfunction.  He has mild to moderate aortic stenosis mild aortic insufficiency.  He has noticed that his CP have resolved after he improved his diet .   Has decreased his spicy and greasy foods.   Is traveling to the [place] in several weeks.   Is a retired Environmental education officer.   Will be speaking   Jan. 13, 2020 Doing well.   No further chest pain  Exercise some.   Busy with projects  No leg edema.   Breathing is ok  Aug. 28. 2020  Gabriel [name] is seen today for follow up of his AS/AI.    Has a hx of CAD - s/p several PCIs around 1995 - 2000 Hx of hyperlipidemia, OSA Is constantly lightheaded.   Worse with standing from a sitting  position   May not drink enough water   Needs deviated septum surgery  Is on ASA and plavix  Has not had any angina   Does lots of exertional activities with any significant angina    Past Medical History:  Diagnosis Date  . 1st degree AV block   . Aortic valve disease    a. Mild AS, AI by echo 01/2014.  Marland Kitchen CAD (coronary artery disease)    a. s/p PTCA 1995. b. MI in 2003 s/p stenting to LAD 2003, s/p stent to Cx 2003. c. prior rotablator to PTCA vessel. d. Abnl nuc 01/2014: s/p DES to LAD, mild-mod nonobstructive disease otherwise.  . Carpal tunnel syndrome   . GERD (gastroesophageal reflux disease)   . Hyperlipidemia    a. Can only tolerate low dose statins.  . Hypertension    . Insomnia   . Ischemic cardiomyopathy    a. EF 40-45% by echo 02/04/14, 40-45% by cath 02/05/14 (similar to 2010).  . Kidney stone   . Neuropathy   . OSA (obstructive sleep apnea)    with upper airway resistancy  . RBBB   . Small intestinal bacterial overgrowth   . Tubular adenoma of colon   . Vertigo    Had Epley maneuver, vestibular rehab.    Past Surgical History:  Procedure Laterality Date  . BELPHAROPTOSIS REPAIR Bilateral 05/30/2017  . CARDIAC CATHETERIZATION  2003   Following MI  . CATARACT EXTRACTION, BILATERAL  2004  . CHOLECYSTECTOMY  2009  . CORONARY ANGIOPLASTY  2003   LAD  . CORONARY STENT PLACEMENT  2003   LCX  . HERNIA REPAIR  1994   double  . KIDNEY STONE SURGERY  10/2005  . KNEE SURGERY Left   . LEFT HEART CATH  02/05/2014  . LEFT HEART CATHETERIZATION WITH CORONARY ANGIOGRAM N/A 02/05/2014   Procedure: LEFT HEART CATHETERIZATION WITH CORONARY ANGIOGRAM;  Surgeon: Jettie Booze, MD;  Location: [facility] CATH LAB;  Service: Cardiovascular;  Laterality: N/A;  . LITHOTRIPSY  10/2005  . meniscus removal- knee Right   . PERCUTANEOUS CORONARY STENT INTERVENTION (PCI-S) Right 02/05/2014   Procedure: PERCUTANEOUS CORONARY STENT INTERVENTION (PCI-S);  Surgeon: Jettie Booze, MD;  Location: [facility] CATH LAB;  Service: Cardiovascular;  Laterality: Right;  . SHOULDER OPEN ROTATOR CUFF REPAIR  1990     Current Outpatient Medications  Medication Sig Dispense Refill  . amLODipine (NORVASC) 5 MG tablet TAKE 1 TABLET BY MOUTH DAILY. 90 tablet 2  . aspirin EC 81 MG tablet Take 81 mg by mouth daily.    Marland Kitchen atorvastatin (LIPITOR) 10 MG tablet Take 1 tablet (10 mg total) by mouth daily. 90 tablet 1  . cetirizine (ZYRTEC) 10 MG tablet Take 10 mg by mouth as needed.     . clopidogrel (PLAVIX) 75 MG tablet TAKE 1 TABLET BY MOUTH EVERY DAY 90 tablet 1  . Coenzyme Q10 (CO Q 10 PO) Take 200 mg by mouth daily.     Marland Kitchen ezetimibe (ZETIA) 10 MG tablet TAKE 1 TABLET BY MOUTH DAILY. 90 tablet  3  . fluticasone (FLONASE) 50 MCG/ACT nasal spray Place 2 sprays into both nostrils daily. 16 g 2  . gabapentin (NEURONTIN) 800 MG tablet Take 1 tablet (800 mg total) by mouth 3 (three) times daily. 270 tablet 1  . lisinopril (ZESTRIL) 20 MG tablet TAKE 1 TABLET BY MOUTH DAILY. 90 tablet 1  . Magnesium 500 MG TABS Take 500 mg by mouth daily.    . metoprolol succinate (  TOPROL-XL) 25 MG 24 hr tablet TAKE 1 TABLET BY MOUTH DAILY. 90 tablet 3  . Multiple Vitamins-Minerals (MULTIVITAMINS THER. W/MINERALS) TABS Take 1 tablet by mouth daily.    . nitroGLYCERIN (NITROSTAT) 0.4 MG SL tablet Place 1 tablet (0.4 mg total) under the tongue every 5 (five) minutes as needed for chest pain. 30 tablet 1  . Omega-3 Fatty Acids (OMEGA 3 PO) Take 1 tablet by mouth daily.    . sildenafil (REVATIO) 20 MG tablet Take 2 tablets (40 mg total) by mouth daily as needed (erectile dysfunction). 50 tablet 0  . Wheat Dextrin (BENEFIBER PO) Take 5 g by mouth daily.    Marland Kitchen zolpidem (AMBIEN) 10 MG tablet Take 1 tablet (10 mg total) by mouth at bedtime as needed. for sleep 90 tablet 0   No current facility-administered medications for this visit.     Allergies:   Lipitor [atorvastatin calcium] and Mevacor [lovastatin]    Social History:  The patient  reports that he has quit smoking. He has never used smokeless tobacco. He reports that he does not drink alcohol or use drugs.   Family History:  The patient's family history includes Diabetes in his mother; Heart Problems in his mother; Other in his sister; Parkinson's disease (age of onset: 30) in his father.    ROS:   Noted in current history, otherwise review of systems is negative.  Physical Exam: Blood pressure 120/62, pulse 73, height 6' (1.829 m), weight 214 lb (97.1 kg), SpO2 94 %.  GEN:  Well nourished, well developed in no acute distress HEENT: Normal NECK: No JVD; No carotid bruits LYMPHATICS: No lymphadenopathy CARDIAC: RRR  3/6 systolic murmur radiating to  the upper RSB  RESPIRATORY:  Clear to auscultation without rales, wheezing or rhonchi  ABDOMEN: Soft, non-tender, non-distended MUSCULOSKELETAL:  No edema; No deformity  SKIN: Warm and dry NEUROLOGIC:  Alert and oriented x 3    EKG:     Recent Labs: 02/28/2019: ALT 20; BUN 20; Creatinine, Ser 1.37; Potassium 4.6; Sodium 137    Lipid Panel    Component Value Date/Time   CHOL 155 02/28/2019 1442   CHOL 155 08/07/2018 0815   TRIG 175.0 (H) 02/28/2019 1442   HDL 48.80 02/28/2019 1442   HDL 50 08/07/2018 0815   CHOLHDL 3 02/28/2019 1442   VLDL 35.0 02/28/2019 1442   LDLCALC 71 02/28/2019 1442   LDLCALC 70 08/07/2018 0815   LDLDIRECT 145.5 09/16/2013 0811      Wt Readings from Last 3 Encounters:  03/29/19 214 lb (97.1 kg)  03/01/19 211 lb 8 oz (95.9 kg)  02/12/19 215 lb 6.4 oz (97.7 kg)      Other studies Reviewed: Additional studies/ records that were reviewed today include: . Review of the above records demonstrates:    ASSESSMENT AND PLAN:  1. Coronary artery disease -  Stable,  Last PCI 4 years ago.   He needs to have nose surgery .   He is at low risk.   He may hold his Plavix and ASA for 5-7 days prior to surgery .   2. Mild systolic congestive heart failure :   Mild - mod CHF   3. Essential hypertension :well controlled.      4. Hyperlipidemia-   Stable . LDL is 71   5. Aortic stenosis/ aortic insufficiency   Mild/moderate aortic stenosis.  , no symptoms   Current medicines are reviewed at length with the patient today.  The patient does not have concerns  regarding medicines.  The following changes have been made:  no change  Labs/ tests ordered today include:   No orders of the defined types were placed in this encounter.    Disposition:   FU with me in 6 months with lipid levels, liver enzymes, and basic metabolic profile.    Mertie Moores, MD  03/29/2019 4:13 PM    [facility] [place], [place], [place]  [address]  Phone: [phone]; Fax: [phone]

## 2019-03-29 ENCOUNTER — Ambulatory Visit (INDEPENDENT_AMBULATORY_CARE_PROVIDER_SITE_OTHER): Payer: Medicare Other | Admitting: Cardiovascular Disease

## 2019-03-29 ENCOUNTER — Other Ambulatory Visit: Payer: Self-pay

## 2019-03-29 ENCOUNTER — Encounter: Payer: Self-pay | Admitting: Cardiovascular Disease

## 2019-03-29 VITALS — BP 120/62 | HR 73 | Ht 72.0 in | Wt 214.0 lb

## 2019-03-29 DIAGNOSIS — I10 Essential (primary) hypertension: Secondary | ICD-10-CM

## 2019-03-29 DIAGNOSIS — I2510 Atherosclerotic heart disease of native coronary artery without angina pectoris: Secondary | ICD-10-CM

## 2019-03-29 DIAGNOSIS — I2583 Coronary atherosclerosis due to lipid rich plaque: Secondary | ICD-10-CM

## 2019-03-29 NOTE — Patient Instructions (Addendum)
Your physician recommends that you continue on your current medications as directed. Please refer to the Current Medication list given to you today.  Your physician wants you to follow-up in: 6 MONTHS WITH DR NAHSER  You will receive a reminder letter in the mail two months in advance. If you don't receive a letter, please call our office to schedule the follow-up appointment.  

## 2019-04-01 ENCOUNTER — Encounter: Payer: Self-pay | Admitting: Cardiology

## 2019-04-03 ENCOUNTER — Telehealth: Payer: Self-pay | Admitting: *Deleted

## 2019-04-03 NOTE — Telephone Encounter (Signed)
   [facility] Pre-operative Risk Assessment    Request for surgical clearance:  1. What type of surgery is being performed? SEPTOPLASTY & INFERIOR TURBINATE REDUCTION   2. When is this surgery scheduled?  TBD   3. What type of clearance is required (medical clearance vs. Pharmacy clearance to hold med vs. Both)? MEDICAL  4. Are there any medications that need to be held prior to surgery and how long? PLAVIX    5. Practice name and name of physician performing surgery? [facility] DR. DAVID SHOEMAKER   6. What is your office phone number [phone]    7.   What is your office fax number [phone]  8.   Anesthesia type (None, local, MAC, general) ? GENERAL   Julaine Hua 04/03/2019, 5:08 PM  _________________________________________________________________   (provider comments below)

## 2019-04-04 NOTE — Telephone Encounter (Signed)
   Primary Cardiologist: Mertie Moores, MD  Chart reviewed as part of pre-operative protocol coverage. Given past medical history and time since last visit, based on ACC/AHA guidelines, Gabriel [name] would be at acceptable risk for the planned procedure without further cardiovascular testing.   Per office visit with Dr. Acie Fredrickson on 03/29/2019, patient is low risk for the surgery.  He may hold his Plavix and aspirin for 5-7 days prior to surgery.  I will route this recommendation to the requesting party via Epic fax function and remove from pre-op pool.  Please call with questions.  Daune Perch, NP 04/04/2019, 11:48 AM

## 2019-04-05 ENCOUNTER — Other Ambulatory Visit: Payer: Self-pay | Admitting: Cardiovascular Disease

## 2019-04-09 DIAGNOSIS — H52203 Unspecified astigmatism, bilateral: Secondary | ICD-10-CM | POA: Diagnosis not present

## 2019-04-09 DIAGNOSIS — Z961 Presence of intraocular lens: Secondary | ICD-10-CM | POA: Diagnosis not present

## 2019-04-10 DIAGNOSIS — D485 Neoplasm of uncertain behavior of skin: Secondary | ICD-10-CM | POA: Diagnosis not present

## 2019-04-10 DIAGNOSIS — L821 Other seborrheic keratosis: Secondary | ICD-10-CM | POA: Diagnosis not present

## 2019-04-10 DIAGNOSIS — Z85828 Personal history of other malignant neoplasm of skin: Secondary | ICD-10-CM | POA: Diagnosis not present

## 2019-04-24 ENCOUNTER — Other Ambulatory Visit: Payer: Self-pay | Admitting: Primary Care

## 2019-04-24 DIAGNOSIS — I10 Essential (primary) hypertension: Secondary | ICD-10-CM

## 2019-04-27 ENCOUNTER — Other Ambulatory Visit (HOSPITAL_COMMUNITY)
Admission: RE | Admit: 2019-04-27 | Discharge: 2019-04-27 | Disposition: A | Discharge status: Home / Self Care | Payer: Medicare Other | Source: Ambulatory Visit | Attending: Otolaryngology | Admitting: Otolaryngology

## 2019-04-27 DIAGNOSIS — Z20828 Contact with and (suspected) exposure to other viral communicable diseases: Secondary | ICD-10-CM | POA: Insufficient documentation

## 2019-04-27 DIAGNOSIS — Z01812 Encounter for preprocedural laboratory examination: Secondary | ICD-10-CM | POA: Diagnosis not present

## 2019-04-28 LAB — NOVEL CORONAVIRUS, NAA (HOSP ORDER, SEND-OUT TO REF LAB; TAT 18-24 HRS): SARS-CoV-2, NAA: NOT DETECTED

## 2019-04-29 ENCOUNTER — Other Ambulatory Visit (INDEPENDENT_AMBULATORY_CARE_PROVIDER_SITE_OTHER): Payer: Medicare Other

## 2019-04-29 DIAGNOSIS — I10 Essential (primary) hypertension: Secondary | ICD-10-CM | POA: Diagnosis not present

## 2019-04-29 LAB — BASIC METABOLIC PANEL
BUN: 17 mg/dL (ref 6–23)
CO2: 28 mEq/L (ref 19–32)
Calcium: 8.8 mg/dL (ref 8.4–10.5)
Chloride: 103 mEq/L (ref 96–112)
Creatinine, Ser: 1.22 mg/dL (ref 0.40–1.50)
GFR: 56.77 mL/min — ABNORMAL LOW (ref >60.00)
Glucose, Bld: 176 mg/dL — ABNORMAL HIGH (ref 70–99)
Potassium: 4.1 mEq/L (ref 3.5–5.1)
Sodium: 139 mEq/L (ref 135–145)

## 2019-04-30 ENCOUNTER — Encounter (HOSPITAL_COMMUNITY): Payer: Self-pay | Admitting: *Deleted

## 2019-04-30 ENCOUNTER — Other Ambulatory Visit: Payer: Self-pay

## 2019-04-30 NOTE — Progress Notes (Signed)
Mr Laidig denies chest pain or shortness of breath.  Patient tested negative for Covid 04/27/2019 and has been in quarantine since that time with his wife.  Mr Mcculloh has stopped Plavix and Aspirin, last dose was 04/24/2019; with permission from cardiologist.

## 2019-05-01 ENCOUNTER — Ambulatory Visit (HOSPITAL_COMMUNITY): Payer: Medicare Other | Admitting: Certified Registered Nurse Anesthetist

## 2019-05-01 ENCOUNTER — Other Ambulatory Visit: Payer: Medicare Other

## 2019-05-01 ENCOUNTER — Other Ambulatory Visit: Payer: Self-pay

## 2019-05-01 ENCOUNTER — Encounter (HOSPITAL_COMMUNITY): Payer: Self-pay

## 2019-05-01 ENCOUNTER — Ambulatory Visit (HOSPITAL_COMMUNITY)
Admission: RE | Admit: 2019-05-01 | Discharge: 2019-05-01 | Disposition: A | Discharge status: Home / Self Care | Payer: Medicare Other | Attending: Otolaryngology | Admitting: Otolaryngology

## 2019-05-01 ENCOUNTER — Encounter (HOSPITAL_COMMUNITY)
Admission: RE | Disposition: A | Discharge status: Home / Self Care | Payer: Self-pay | Source: Home / Self Care | Attending: Otolaryngology

## 2019-05-01 DIAGNOSIS — G4700 Insomnia, unspecified: Secondary | ICD-10-CM | POA: Diagnosis not present

## 2019-05-01 DIAGNOSIS — M1990 Unspecified osteoarthritis, unspecified site: Secondary | ICD-10-CM | POA: Diagnosis not present

## 2019-05-01 DIAGNOSIS — J342 Deviated nasal septum: Secondary | ICD-10-CM | POA: Diagnosis not present

## 2019-05-01 DIAGNOSIS — Z7902 Long term (current) use of antithrombotics/antiplatelets: Secondary | ICD-10-CM | POA: Insufficient documentation

## 2019-05-01 DIAGNOSIS — I509 Heart failure, unspecified: Secondary | ICD-10-CM | POA: Diagnosis not present

## 2019-05-01 DIAGNOSIS — J343 Hypertrophy of nasal turbinates: Secondary | ICD-10-CM | POA: Insufficient documentation

## 2019-05-01 DIAGNOSIS — Z79899 Other long term (current) drug therapy: Secondary | ICD-10-CM | POA: Insufficient documentation

## 2019-05-01 DIAGNOSIS — Z955 Presence of coronary angioplasty implant and graft: Secondary | ICD-10-CM | POA: Insufficient documentation

## 2019-05-01 DIAGNOSIS — I110 Hypertensive heart disease with heart failure: Secondary | ICD-10-CM | POA: Diagnosis not present

## 2019-05-01 DIAGNOSIS — I252 Old myocardial infarction: Secondary | ICD-10-CM | POA: Diagnosis not present

## 2019-05-01 DIAGNOSIS — E785 Hyperlipidemia, unspecified: Secondary | ICD-10-CM | POA: Insufficient documentation

## 2019-05-01 DIAGNOSIS — J3489 Other specified disorders of nose and nasal sinuses: Secondary | ICD-10-CM | POA: Diagnosis not present

## 2019-05-01 DIAGNOSIS — N529 Male erectile dysfunction, unspecified: Secondary | ICD-10-CM | POA: Insufficient documentation

## 2019-05-01 DIAGNOSIS — Z87891 Personal history of nicotine dependence: Secondary | ICD-10-CM | POA: Diagnosis not present

## 2019-05-01 DIAGNOSIS — Z7982 Long term (current) use of aspirin: Secondary | ICD-10-CM | POA: Diagnosis not present

## 2019-05-01 DIAGNOSIS — I255 Ischemic cardiomyopathy: Secondary | ICD-10-CM | POA: Insufficient documentation

## 2019-05-01 DIAGNOSIS — G4733 Obstructive sleep apnea (adult) (pediatric): Secondary | ICD-10-CM | POA: Diagnosis not present

## 2019-05-01 DIAGNOSIS — G629 Polyneuropathy, unspecified: Secondary | ICD-10-CM | POA: Diagnosis not present

## 2019-05-01 DIAGNOSIS — I2510 Atherosclerotic heart disease of native coronary artery without angina pectoris: Secondary | ICD-10-CM | POA: Insufficient documentation

## 2019-05-01 HISTORY — DX: Personal history of urinary calculi: Z87.442

## 2019-05-01 HISTORY — PX: NASAL SEPTOPLASTY W/ TURBINOPLASTY: SHX2070

## 2019-05-01 HISTORY — DX: Unspecified osteoarthritis, unspecified site: M19.90

## 2019-05-01 LAB — CBC
HCT: 40.1 % (ref 39.0–52.0)
Hemoglobin: 14.6 g/dL (ref 13.0–17.0)
MCH: 37 pg — ABNORMAL HIGH (ref 26.0–34.0)
MCHC: 36.4 g/dL — ABNORMAL HIGH (ref 30.0–36.0)
MCV: 101.5 fL — ABNORMAL HIGH (ref 80.0–100.0)
Platelets: 188 10*3/uL (ref 150–400)
RBC: 3.95 MIL/uL — ABNORMAL LOW (ref 4.22–5.81)
RDW: 12.3 % (ref 11.5–15.5)
WBC: 6.6 10*3/uL (ref 4.0–10.5)
nRBC: 0 % (ref 0.0–0.2)

## 2019-05-01 SURGERY — SEPTOPLASTY, NOSE, WITH NASAL TURBINATE REDUCTION
Anesthesia: General | Laterality: Bilateral

## 2019-05-01 MED ORDER — ACETAMINOPHEN 160 MG/5ML PO SOLN: 1000.0000 mg | Freq: Once | ORAL | Status: DC | PRN

## 2019-05-01 MED ORDER — ONDANSETRON HCL 4 MG/2ML IJ SOLN
INTRAMUSCULAR | Status: AC
  Filled 2019-05-01: qty 2

## 2019-05-01 MED ORDER — DEXAMETHASONE SODIUM PHOSPHATE 10 MG/ML IJ SOLN
INTRAMUSCULAR | Status: AC
  Filled 2019-05-01: qty 1

## 2019-05-01 MED ORDER — FENTANYL CITRATE (PF) 250 MCG/5ML IJ SOLN
INTRAMUSCULAR | Status: DC | PRN
  Administered 2019-05-01: 100 ug via INTRAVENOUS

## 2019-05-01 MED ORDER — FENTANYL CITRATE (PF) 250 MCG/5ML IJ SOLN
INTRAMUSCULAR | Status: AC
  Filled 2019-05-01: qty 5

## 2019-05-01 MED ORDER — EPHEDRINE 5 MG/ML INJ
INTRAVENOUS | Status: AC
  Filled 2019-05-01: qty 10

## 2019-05-01 MED ORDER — 0.9 % SODIUM CHLORIDE (POUR BTL) OPTIME
TOPICAL | Status: DC | PRN
  Administered 2019-05-01: 08:00:00 1000 mL

## 2019-05-01 MED ORDER — LIDOCAINE 2% (20 MG/ML) 5 ML SYRINGE
INTRAMUSCULAR | Status: AC
  Filled 2019-05-01: qty 5

## 2019-05-01 MED ORDER — ROCURONIUM BROMIDE 10 MG/ML (PF) SYRINGE
PREFILLED_SYRINGE | INTRAVENOUS | Status: AC
  Filled 2019-05-01: qty 10

## 2019-05-01 MED ORDER — DEXAMETHASONE SODIUM PHOSPHATE 10 MG/ML IJ SOLN
INTRAMUSCULAR | Status: DC | PRN
  Administered 2019-05-01: 5 mg via INTRAVENOUS

## 2019-05-01 MED ORDER — OXYCODONE HCL 5 MG/5ML PO SOLN: 5.0000 mg | Freq: Once | ORAL | Status: DC | PRN

## 2019-05-01 MED ORDER — SUGAMMADEX SODIUM 200 MG/2ML IV SOLN
INTRAVENOUS | Status: DC | PRN
  Administered 2019-05-01: 200 mg via INTRAVENOUS

## 2019-05-01 MED ORDER — LACTATED RINGERS IV SOLN
INTRAVENOUS | Status: DC | PRN
  Administered 2019-05-01: 07:00:00 via INTRAVENOUS

## 2019-05-01 MED ORDER — OXYCODONE HCL 5 MG PO TABS: 5.0000 mg | ORAL_TABLET | Freq: Once | ORAL | Status: DC | PRN

## 2019-05-01 MED ORDER — ACETAMINOPHEN 10 MG/ML IV SOLN: 1000.0000 mg | Freq: Once | INTRAVENOUS | Status: DC | PRN

## 2019-05-01 MED ORDER — PROPOFOL 10 MG/ML IV BOLUS
INTRAVENOUS | Status: AC
  Filled 2019-05-01: qty 40

## 2019-05-01 MED ORDER — PHENYLEPHRINE 40 MCG/ML (10ML) SYRINGE FOR IV PUSH (FOR BLOOD PRESSURE SUPPORT)
PREFILLED_SYRINGE | INTRAVENOUS | Status: DC | PRN
  Administered 2019-05-01: 120 ug via INTRAVENOUS
  Administered 2019-05-01 (×2): 40 ug via INTRAVENOUS
  Administered 2019-05-01: 120 ug via INTRAVENOUS

## 2019-05-01 MED ORDER — EPHEDRINE SULFATE 50 MG/ML IJ SOLN
INTRAMUSCULAR | Status: DC | PRN
  Administered 2019-05-01 (×3): 5 mg via INTRAVENOUS

## 2019-05-01 MED ORDER — CEPHALEXIN 500 MG PO CAPS: 500.0000 mg | ORAL_CAPSULE | Freq: Three times a day (TID) | ORAL | 0 refills | Status: AC

## 2019-05-01 MED ORDER — PHENYLEPHRINE 40 MCG/ML (10ML) SYRINGE FOR IV PUSH (FOR BLOOD PRESSURE SUPPORT)
PREFILLED_SYRINGE | INTRAVENOUS | Status: AC
  Filled 2019-05-01: qty 10

## 2019-05-01 MED ORDER — MIDAZOLAM HCL 2 MG/2ML IJ SOLN
INTRAMUSCULAR | Status: AC
  Filled 2019-05-01: qty 2

## 2019-05-01 MED ORDER — PROPOFOL 10 MG/ML IV BOLUS
INTRAVENOUS | Status: DC | PRN
  Administered 2019-05-01: 120 mg via INTRAVENOUS

## 2019-05-01 MED ORDER — MUPIROCIN 2 % EX OINT
TOPICAL_OINTMENT | CUTANEOUS | Status: DC | PRN
  Administered 2019-05-01: 1 via TOPICAL

## 2019-05-01 MED ORDER — OXYMETAZOLINE HCL 0.05 % NA SOLN
NASAL | Status: AC
  Filled 2019-05-01: qty 30

## 2019-05-01 MED ORDER — OXYMETAZOLINE HCL 0.05 % NA SOLN
NASAL | Status: DC | PRN
  Administered 2019-05-01: 1

## 2019-05-01 MED ORDER — CEFAZOLIN SODIUM-DEXTROSE 2-3 GM-%(50ML) IV SOLR
INTRAVENOUS | Status: DC | PRN
  Administered 2019-05-01: 2 g via INTRAVENOUS

## 2019-05-01 MED ORDER — FENTANYL CITRATE (PF) 100 MCG/2ML IJ SOLN: 25.0000 ug | INTRAMUSCULAR | Status: DC | PRN

## 2019-05-01 MED ORDER — LIDOCAINE 2% (20 MG/ML) 5 ML SYRINGE
INTRAMUSCULAR | Status: DC | PRN
  Administered 2019-05-01: 60 mg via INTRAVENOUS

## 2019-05-01 MED ORDER — MUPIROCIN 2 % EX OINT
TOPICAL_OINTMENT | CUTANEOUS | Status: AC
  Filled 2019-05-01: qty 22

## 2019-05-01 MED ORDER — ACETAMINOPHEN 500 MG PO TABS: 1000.0000 mg | ORAL_TABLET | Freq: Once | ORAL | Status: DC | PRN

## 2019-05-01 MED ORDER — LIDOCAINE-EPINEPHRINE 1 %-1:100000 IJ SOLN
INTRAMUSCULAR | Status: AC
  Filled 2019-05-01: qty 1

## 2019-05-01 MED ORDER — ONDANSETRON HCL 4 MG/2ML IJ SOLN
INTRAMUSCULAR | Status: DC | PRN
  Administered 2019-05-01: 4 mg via INTRAVENOUS

## 2019-05-01 MED ORDER — MUPIROCIN CALCIUM 2 % EX CREA
TOPICAL_CREAM | CUTANEOUS | Status: AC
  Filled 2019-05-01: qty 15

## 2019-05-01 MED ORDER — LIDOCAINE-EPINEPHRINE 1 %-1:100000 IJ SOLN
INTRAMUSCULAR | Status: DC | PRN
  Administered 2019-05-01: 7 mL

## 2019-05-01 MED ORDER — ROCURONIUM BROMIDE 10 MG/ML (PF) SYRINGE
PREFILLED_SYRINGE | INTRAVENOUS | Status: DC | PRN
  Administered 2019-05-01: 70 mg via INTRAVENOUS

## 2019-05-01 SURGICAL SUPPLY — 27 items
BLADE SURG 15 STRL LF DISP TIS (BLADE) ×1 IMPLANT
BLADE SURG 15 STRL SS (BLADE) ×3
CANISTER SUCT 3000ML PPV (MISCELLANEOUS) ×3 IMPLANT
COAGULATOR SUCT 8FR VV (MISCELLANEOUS) IMPLANT
COVER WAND RF STERILE (DRAPES) ×3 IMPLANT
DRAPE HALF SHEET 40X57 (DRAPES) IMPLANT
ELECT REM PT RETURN 9FT ADLT (ELECTROSURGICAL)
ELECTRODE REM PT RTRN 9FT ADLT (ELECTROSURGICAL) IMPLANT
GAUZE SPONGE 2X2 8PLY STRL LF (GAUZE/BANDAGES/DRESSINGS) ×1 IMPLANT
GLOVE BIOGEL M 7.0 STRL (GLOVE) ×6 IMPLANT
GOWN STRL REUS W/ TWL LRG LVL3 (GOWN DISPOSABLE) ×2 IMPLANT
GOWN STRL REUS W/TWL LRG LVL3 (GOWN DISPOSABLE) ×6
KIT BASIN OR (CUSTOM PROCEDURE TRAY) ×3 IMPLANT
KIT TURNOVER KIT B (KITS) ×3 IMPLANT
NDL HYPO 25GX1X1/2 BEV (NEEDLE) ×1 IMPLANT
NEEDLE HYPO 25GX1X1/2 BEV (NEEDLE) ×3 IMPLANT
NS IRRIG 1000ML POUR BTL (IV SOLUTION) ×3 IMPLANT
PAD ARMBOARD 7.5X6 YLW CONV (MISCELLANEOUS) ×3 IMPLANT
SPLINT NASAL DOYLE BI-VL (GAUZE/BANDAGES/DRESSINGS) ×3 IMPLANT
SPONGE GAUZE 2X2 STER 10/PKG (GAUZE/BANDAGES/DRESSINGS) ×2
SPONGE NEURO XRAY DETECT 1X3 (DISPOSABLE) ×3 IMPLANT
SUT ETHILON 3 0 PS 1 (SUTURE) ×3 IMPLANT
SUT PLAIN 4 0 ~~LOC~~ 1 (SUTURE) ×3 IMPLANT
TOWEL GREEN STERILE FF (TOWEL DISPOSABLE) ×3 IMPLANT
TRAY ENT MC OR (CUSTOM PROCEDURE TRAY) ×3 IMPLANT
TUBE SALEM SUMP 16 FR W/ARV (TUBING) ×3 IMPLANT
TUBING EXTENTION W/L.L. (IV SETS) ×3 IMPLANT

## 2019-05-01 NOTE — Anesthesia Preprocedure Evaluation (Addendum)
Anesthesia Evaluation  Patient identified by MRN, date of birth, ID band Patient awake    Reviewed: Allergy & Precautions, NPO status , Patient's Chart, lab work & pertinent test results, reviewed documented beta blocker date and time   History of Anesthesia Complications Negative for: history of anesthetic complications  Airway Mallampati: II  TM Distance: >3 FB Neck ROM: Full    Dental  (+) Dental Advisory Given   Pulmonary sleep apnea , neg COPD, neg recent URI, former smoker,    breath sounds clear to auscultation       Cardiovascular hypertension, Pt. on medications and Pt. on home beta blockers (-) angina+ CAD, + Past MI, + Cardiac Stents and +CHF  + dysrhythmias  Rhythm:Regular   Nuclear stress EF: 41%.  Defect 1: There is a medium defect of severe severity present in the basal inferolateral, mid inferolateral and apical lateral location.  Findings consistent with prior myocardial infarction.  This is an intermediate risk study.  The left ventricular ejection fraction is moderately decreased (30-44%).   Neuro/Psych  Neuromuscular disease    GI/Hepatic Neg liver ROS, GERD  ,  Endo/Other  negative endocrine ROS  Renal/GU Renal disease     Musculoskeletal  (+) Arthritis ,   Abdominal   Peds  Hematology negative hematology ROS (+)   Anesthesia Other Findings Cath in July 2015:  IMPRESSIONS:  1. Normal left main coronary artery. 2. 90% focal stenosis in the mid left anterior descending artery. This was successfully treated with a 2.25 x 12 promus drug-eluting stent, postdilated to 2.6 mm in diameter. Previously placed stent in the more proximal portion of the LAD is widely patent. 3. Patent stents in the mid left circumflex artery. Mild to moderate disease in the circumflex system. 4. Mild to moderate disease right coronary artery system. The distal RCA stent has mild in-stent restenosis. 5. Mildly  decreased left ventricular systolic function. LVEDP 19 mmHg. Ejection fraction 40-45%.  Reproductive/Obstetrics                            Anesthesia Physical Anesthesia Plan  ASA: III  Anesthesia Plan: General   Post-op Pain Management:    Induction: Intravenous  PONV Risk Score and Plan: 2 and Ondansetron and Dexamethasone  Airway Management Planned: Oral ETT  Additional Equipment: None  Intra-op Plan:   Post-operative Plan: Extubation in OR  Informed Consent: I have reviewed the patients History and Physical, chart, labs and discussed the procedure including the risks, benefits and alternatives for the proposed anesthesia with the patient or authorized representative who has indicated his/her understanding and acceptance.     Dental advisory given  Plan Discussed with: CRNA and Surgeon  Anesthesia Plan Comments:         Anesthesia Quick Evaluation

## 2019-05-01 NOTE — H&P (Signed)
Gabriel [name] is an 84 y.o. male.   Chief Complaint: Nasal Airway Obstruction   HPI: Hx of progressive nasal obstruction and difficulty with CPAP for OSA  Past Medical History:  Diagnosis Date  . 1st degree AV block   . Aortic valve disease    a. Mild AS, AI by echo 01/2014.  Marland Kitchen Arthritis   . CAD (coronary artery disease)    a. s/p PTCA 1995. b. MI in 2003 s/p stenting to LAD 2003, s/p stent to Cx 2003. c. prior rotablator to PTCA vessel. d. Abnl nuc 01/2014: s/p DES to LAD, mild-mod nonobstructive disease otherwise.  . Carpal tunnel syndrome   . Carpal tunnel syndrome   . Carpal tunnel syndrome    right  . GERD (gastroesophageal reflux disease)   . History of kidney stones   . Hyperlipidemia    a. Can only tolerate low dose statins.  . Hypertension   . Insomnia   . Ischemic cardiomyopathy    a. EF 40-45% by echo 02/04/14, 40-45% by cath 02/05/14 (similar to 2010).  . Kidney stone   . Neuropathy   . OSA (obstructive sleep apnea)    with upper airway resistancy  . Pneumonia 2019  . RBBB   . Small intestinal bacterial overgrowth   . Tubular adenoma of colon   . Vertigo    Had Epley maneuver, vestibular rehab.    Past Surgical History:  Procedure Laterality Date  . BELPHAROPTOSIS REPAIR Bilateral 05/30/2017  . CARDIAC CATHETERIZATION  2003   Following MI  . CARPAL TUNNEL RELEASE Left 2016  . CATARACT EXTRACTION, BILATERAL  2004  . CHOLECYSTECTOMY  2009  . COLONOSCOPY W/ POLYPECTOMY    . CORONARY ANGIOPLASTY  2003   LAD  . CORONARY STENT PLACEMENT  2003   LCX  . ESOPHAGOGASTRODUODENOSCOPY    . HERNIA REPAIR Bilateral 1994   double  . KIDNEY STONE SURGERY  10/2005  . KNEE SURGERY Left    arthroscopy   . LEFT HEART CATH  02/05/2014  . LEFT HEART CATHETERIZATION WITH CORONARY ANGIOGRAM N/A 02/05/2014   Procedure: LEFT HEART CATHETERIZATION WITH CORONARY ANGIOGRAM;  Surgeon: Jettie Booze, MD;  Location: [facility] CATH LAB;  Service: Cardiovascular;  Laterality: N/A;  .  LITHOTRIPSY  10/2005  . meniscus removal- knee Right   . PERCUTANEOUS CORONARY STENT INTERVENTION (PCI-S) Right 02/05/2014   Procedure: PERCUTANEOUS CORONARY STENT INTERVENTION (PCI-S);  Surgeon: Jettie Booze, MD;  Location: [facility] CATH LAB;  Service: Cardiovascular;  Laterality: Right;  . SHOULDER OPEN ROTATOR CUFF REPAIR Right 1990    Family History  Problem Relation Age of Onset  . Heart Problems Mother   . Diabetes Mother   . Parkinson's disease Father 69  . Other Sister        Muscular degeneration  . Heart attack Neg Hx   . Stroke Neg Hx    Social History:  reports that he quit smoking about 70 years ago. He has never used smokeless tobacco. He reports that he does not drink alcohol or use drugs.  Allergies:  Allergies  Allergen Reactions  . Lipitor [Atorvastatin Calcium] Other (See Comments)    Muscle pain--Pt stated he can tolerate a low dose  . Mevacor [Lovastatin] Other (See Comments)    "makes me feel loopy"    Medications Prior to Admission  Medication Sig Dispense Refill  . amLODipine (NORVASC) 5 MG tablet TAKE 1 TABLET BY MOUTH EVERY DAY 90 tablet 3  . atorvastatin (LIPITOR) 10 MG  tablet Take 1 tablet (10 mg total) by mouth daily. 90 tablet 1  . cetirizine (ZYRTEC) 10 MG tablet Take 10 mg by mouth every evening.     . Coenzyme Q10 (CO Q 10 PO) Take 200 mg by mouth daily.     Marland Kitchen ezetimibe (ZETIA) 10 MG tablet TAKE 1 TABLET BY MOUTH DAILY. 90 tablet 3  . fluticasone (FLONASE) 50 MCG/ACT nasal spray Place 2 sprays into both nostrils daily. (Patient taking differently: Place 2 sprays into both nostrils at bedtime. ) 16 g 2  . gabapentin (NEURONTIN) 800 MG tablet Take 1 tablet (800 mg total) by mouth 3 (three) times daily. (Patient taking differently: Take 800 mg by mouth 2 (two) times daily. ) 270 tablet 1  . lisinopril (ZESTRIL) 20 MG tablet TAKE 1 TABLET BY MOUTH DAILY. 90 tablet 1  . Magnesium 500 MG TABS Take 500 mg by mouth daily.    . metoprolol succinate  (TOPROL-XL) 25 MG 24 hr tablet TAKE 1 TABLET BY MOUTH EVERY DAY 90 tablet 3  . Multiple Vitamins-Minerals (MULTIVITAMINS THER. W/MINERALS) TABS Take 1 tablet by mouth daily.    . nitroGLYCERIN (NITROSTAT) 0.4 MG SL tablet Place 1 tablet (0.4 mg total) under the tongue every 5 (five) minutes as needed for chest pain. 30 tablet 1  . Omega-3 Fatty Acids (OMEGA 3 PO) Take 750 mg by mouth daily.     . sildenafil (REVATIO) 20 MG tablet Take 2 tablets (40 mg total) by mouth daily as needed (erectile dysfunction). 50 tablet 0  . Wheat Dextrin (BENEFIBER PO) Take 5 g by mouth daily.    Marland Kitchen zolpidem (AMBIEN) 10 MG tablet Take 1 tablet (10 mg total) by mouth at bedtime as needed. for sleep (Patient taking differently: Take 10 mg by mouth at bedtime. for sleep) 90 tablet 0  . aspirin EC 81 MG tablet Take 81 mg by mouth daily.    . clopidogrel (PLAVIX) 75 MG tablet TAKE 1 TABLET BY MOUTH EVERY DAY 90 tablet 1    Results for orders placed or performed during the hospital encounter of 05/01/19 (from the past 48 hour(s))  CBC     Status: Abnormal   Collection Time: 05/01/19  6:40 AM  Result Value Ref Range   WBC 6.6 4.0 - 10.5 K/uL   RBC 3.95 (L) 4.22 - 5.81 MIL/uL   Hemoglobin 14.6 13.0 - 17.0 g/dL   HCT 40.1 39.0 - 52.0 %   MCV 101.5 (H) 80.0 - 100.0 fL   MCH 37.0 (H) 26.0 - 34.0 pg   MCHC 36.4 (H) 30.0 - 36.0 g/dL   RDW 12.3 11.5 - 15.5 %   Platelets 188 150 - 400 K/uL   nRBC 0.0 0.0 - 0.2 %    Comment: Performed at [facility], [place] [address] [place], [place] [address]   No results found.  Review of Systems  Constitutional: Negative.   HENT: Positive for congestion.   Respiratory: Negative.   Cardiovascular: Negative.     Blood pressure (!) 149/62, pulse (!) 53, temperature (!) 96.8 F (36 C), temperature source Oral, resp. rate 20, height 6' (1.829 m), weight 96.2 kg, SpO2 100 %. Physical Exam  Constitutional: He appears well-developed and well-nourished.  HENT:  Deviated nasal  septum  Neck: Normal range of motion. Neck supple.     Assessment/Plan Adm for OP septoplasty and IT reduction  Jerrell Belfast, MD 05/01/2019, 7:39 AM

## 2019-05-01 NOTE — Anesthesia Postprocedure Evaluation (Signed)
Anesthesia Post Note  Patient: Gabriel [name]  Procedure(s) Performed: NASAL SEPTOPLASTY WITH TURBINATE REDUCTION (Bilateral )     Patient location during evaluation: PACU Anesthesia Type: General Level of consciousness: awake and alert Pain management: pain level controlled Vital Signs Assessment: post-procedure vital signs reviewed and stable Respiratory status: spontaneous breathing, nonlabored ventilation, respiratory function stable and patient connected to nasal cannula oxygen Cardiovascular status: blood pressure returned to baseline and stable Postop Assessment: no apparent nausea or vomiting Anesthetic complications: no    Last Vitals:  Vitals:   05/01/19 0920 05/01/19 0936  BP: (!) 159/86 (!) 155/84  Pulse: 75   Resp: 11   Temp:  (!) 36.3 C  SpO2: 93%     Last Pain:  Vitals:   05/01/19 0920  TempSrc:   PainSc: 0-No pain                 Dontavia Brand

## 2019-05-01 NOTE — Transfer of Care (Signed)
Immediate Anesthesia Transfer of Care Note  Patient: Gabriel [name]  Procedure(s) Performed: NASAL SEPTOPLASTY WITH TURBINATE REDUCTION (Bilateral )  Patient Location: PACU  Anesthesia Type:General  Level of Consciousness: awake, oriented, patient cooperative and responds to stimulation  Airway & Oxygen Therapy: Patient Spontanous Breathing  Post-op Assessment: Report given to RN, Post -op Vital signs reviewed and stable and Patient moving all extremities X 4  Post vital signs: Reviewed and stable  Last Vitals:  Vitals Value Taken Time  BP 180/89 05/01/19 0851  Temp    Pulse 79 05/01/19 0853  Resp 13 05/01/19 0853  SpO2 94 % 05/01/19 0853  Vitals shown include unvalidated device data.  Last Pain:  Vitals:   05/01/19 0614  TempSrc:   PainSc: 0-No pain      Patients Stated Pain Goal: 0 ([id number])  Complications: No apparent anesthesia complications

## 2019-05-01 NOTE — Op Note (Signed)
Operative Note: SEPTOPLASTY AND INFERIOR TURBINATE REDUCTION  Patient: Gabriel [name] record number: [id number]  Date:05/01/2019  Pre-operative Indications: 1. Deviated nasal septum with nasal airway obstruction     2.  Bilateral inferior turbinate hypertrophy  Postoperative Indications: Same  Surgical Procedure: 1.  Nasal Septoplasty    2.  Bilateral Inferior Turbinate Reduction  Anesthesia: GET  Surgeon: Delsa Bern, M.D.  Complications: None  EBL: 50 cc  Findings: Severely deviated nasal septum with airway obstruction and bilateral inferior turbinate hypertrophy.   Brief History: The patient is a 84 y.o. male with a history of progressive nasal airway obstruction. The patient has been on medical therapy to reduce nasal mucosal edema including saline nasal spray and topical nasal steroids. Despite appropriate medical therapy the patient continues to have ongoing symptoms. Given the patient's history and findings, the above surgical procedures were recommended, risks and benefits were discussed in detail with the patient may understand and agree with our plan for surgery which is scheduled at [facility] under general anesthesia as an outpatient.  Surgical Procedure: The patient is brought to the operating room on 05/01/2019 and placed in supine position on the operating table. General endotracheal anesthesia was established without difficulty. When the patient was adequately anesthetized, surgical timeout was performed and correct identification of the patient and the surgical procedure. The patient's nose was then injected with  7 cc of 1% lidocaine 1 100,000 dilution epinephrine which was injected in a submucosal fashion. The patient's nose was then packed with Afrin-soaked cottonoid pledgets were left in place for approximately 10 minutes lateral vasoconstriction and hemostasis. .  With the patient prepped draped and prepared for surgery, nasal septoplasty was  begun.  A left anterior hemitransfixion incision was created and a mucoperichondrial flap was elevated from anterior to posterior on the left-hand side. The anterior cartilaginous septum was crossed at the midline and a mucoperichondrial flap was elevated on the patient's right.  Swivel knife was then used to resect the anterior and mid cartilaginous portion of the nasal septum.  Resected cartilage was morcellized and returned to the mucoperichondrial pocket at the occlusion of the surgical procedure.  Dissection was then carried out from anterior to posterior removing deviated bone and cartilage including a large septal spur the overlying mucosa was preserved.  With the septum brought to good midline position, the morselized cartilage was returned to the mucoperichondrial pocket and the soft tissue/mucosal flaps were reapproximated with interrupted 4-0 gut suture on a Keith needle in a horizontal mattressing fashion.  Anterior hemitransfixion incision was closed with the same stitch.  Bilateral Doyle nasal septal splints were then placed after the application of Bactroban ointment and sutured in position with a 3-0 Ethilon suture.  Attention was then turned to the inferior turbinates, bilateral inferior turbinate intramural cautery was performed with cautery setting at 13 W.  2 submucosal passes were made in each inferior turbinate.  After completing cautery, anterior vertical incisions were created and overlying soft tissue was elevated, a small amount of turbinate bone was resected.  The turbinates were then outfractured to create a more patent nasal passageway.  Surgical sponge count was correct. An oral gastric tube was passed and the stomach contents were aspirated. Patient was awakened from anesthetic and transferred from the operating room to the recovery room in stable condition. There were no complications and blood loss was 50cc.   Delsa Bern, M.D. [facility] 05/01/2019

## 2019-05-02 ENCOUNTER — Encounter (HOSPITAL_COMMUNITY): Payer: Self-pay | Admitting: Otolaryngology

## 2019-05-10 ENCOUNTER — Other Ambulatory Visit: Payer: Self-pay | Admitting: Primary Care

## 2019-05-10 DIAGNOSIS — G4700 Insomnia, unspecified: Secondary | ICD-10-CM

## 2019-05-10 NOTE — Telephone Encounter (Signed)
Name of Medication: zolpidem 10 mg Name of Pharmacy: [facility] [address] Last Fill or Written Date and Quantity: # 87 on 02/12/19 Last Office Visit and Type: 03/01/19 annual Next Office Visit and Type: none scheduled

## 2019-05-10 NOTE — Telephone Encounter (Signed)
Noted, refill sent to pharmacy. 

## 2019-05-25 ENCOUNTER — Other Ambulatory Visit: Payer: Self-pay | Admitting: Cardiovascular Disease

## 2019-05-28 ENCOUNTER — Other Ambulatory Visit: Payer: Self-pay | Admitting: Cardiovascular Disease

## 2019-05-29 ENCOUNTER — Telehealth: Payer: Self-pay | Admitting: Cardiovascular Disease

## 2019-05-29 MED ORDER — LISINOPRIL 20 MG PO TABS: 20.0000 mg | ORAL_TABLET | Freq: Every day | ORAL | 2 refills | Status: DC

## 2019-05-29 NOTE — Telephone Encounter (Signed)
Pt's medication was sent to pt's pharmacy as requested. Confirmation received.  °

## 2019-05-29 NOTE — Telephone Encounter (Signed)
New message   Pt c/o medication issue:  1. Name of Medication: lisinopril (ZESTRIL) 20 MG tablet  ezetimibe (ZETIA) 10 MG tablet  2. How are you currently taking this medication (dosage and times per day)? As written  3. Are you having a reaction (difficulty breathing--STAT)? No   4. What is your medication issue? Patient needs a new prescription for these medications sent to [facility]

## 2019-05-30 ENCOUNTER — Other Ambulatory Visit: Payer: Self-pay | Admitting: Cardiovascular Disease

## 2019-05-30 MED ORDER — EZETIMIBE 10 MG PO TABS: 10.0000 mg | ORAL_TABLET | Freq: Every day | ORAL | 2 refills | Status: DC

## 2019-05-30 NOTE — Telephone Encounter (Signed)
 *  STAT* If patient is at the pharmacy, call can be transferred to refill team.   1. Which medications need to be refilled? (please list name of each medication and dose if known) ezetimibe (ZETIA) 10 MG tablet  2. Which pharmacy/location (including street and city if local pharmacy) is medication to be sent to? [facility] on [address]  3. Do they need a 30 day or 90 day supply? 90  Patient is out of medication

## 2019-05-30 NOTE — Telephone Encounter (Signed)
Pt's medication was sent to pt's pharmacy as requested. Confirmation received.  °

## 2019-07-22 ENCOUNTER — Other Ambulatory Visit: Payer: Self-pay | Admitting: *Deleted

## 2019-07-22 MED ORDER — ATORVASTATIN CALCIUM 10 MG PO TABS: 10.0000 mg | ORAL_TABLET | Freq: Every day | ORAL | 0 refills | Status: DC

## 2019-08-09 ENCOUNTER — Other Ambulatory Visit: Payer: Self-pay | Admitting: Primary Care

## 2019-08-09 DIAGNOSIS — G4700 Insomnia, unspecified: Secondary | ICD-10-CM

## 2019-08-09 MED ORDER — ZOLPIDEM TARTRATE 10 MG PO TABS: ORAL_TABLET | ORAL | 0 refills | Status: DC

## 2019-08-09 NOTE — Telephone Encounter (Signed)
Last prescribed on 05/10/2019 . Last appointment on 03/01/2019 . No future appointment

## 2019-08-09 NOTE — Telephone Encounter (Signed)
Noted, refill sent to pharmacy. 

## 2019-08-14 ENCOUNTER — Encounter: Payer: Self-pay | Admitting: Neurology

## 2019-08-15 ENCOUNTER — Encounter: Payer: Self-pay | Admitting: Adult Health

## 2019-08-15 ENCOUNTER — Ambulatory Visit (INDEPENDENT_AMBULATORY_CARE_PROVIDER_SITE_OTHER): Payer: Medicare Other | Admitting: Adult Health

## 2019-08-15 ENCOUNTER — Other Ambulatory Visit: Payer: Self-pay

## 2019-08-15 VITALS — BP 136/57 | HR 50 | Temp 97.0°F | Ht 72.5 in | Wt 218.8 lb

## 2019-08-15 DIAGNOSIS — Z9989 Dependence on other enabling machines and devices: Secondary | ICD-10-CM

## 2019-08-15 DIAGNOSIS — G4733 Obstructive sleep apnea (adult) (pediatric): Secondary | ICD-10-CM

## 2019-08-15 NOTE — Progress Notes (Signed)
PATIENT: Gabriel [name] DOB: Sep 02, 1934  REASON FOR VISIT: follow up HISTORY FROM: patient  HISTORY OF PRESENT ILLNESS: Today 08/15/19:  Gabriel [name] is an 85 year old male with a history of obstructive sleep apnea on CPAP.  He returns today for follow-up.  His download indicates that he use his machine nightly for compliance of 100%.  He uses machine greater than 4 hours each night.  On average he uses his machine 7 hours and 40 minutes.  His residual AHI is 1.3 on 5 to 10 cm of water with EPR three.  Leak in the 95th percentile is 13.4 L/min.  He reports that the CPAP is working well for him.  He denies any new issues.  He does report that he did have nasal surgery and reports that sinusitis has improved.  He returns today for an evaluation.  HISTORY 02/12/19:  Gabriel [name] is an 85 year old male with a history of obstructive sleep apnea on CPAP.  He returns today for follow-up.  His CPAP download indicates that he uses machine nightly for compliance of 100%.  He uses machine greater than 4 hours each night.  On average he uses his machine 8 hours and 12 minutes.  His residual AHI is 2.2 on 5 to 10 cm of water with EPR of 3.  His leak in the 95th percentile is 21.1 L/min.  He reports that he continues to tolerate the CPAP well.  He has scheduled an appointment for consultation regarding surgery to fix his nasal septal deviation.  He returns today for follow-up.  REVIEW OF SYSTEMS: Out of a complete 14 system review of symptoms, the patient complains only of the following symptoms, and all other reviewed systems are negative.  See HPI  ALLERGIES: Allergies  Allergen Reactions  . Lipitor [Atorvastatin Calcium] Other (See Comments)    Muscle pain--Pt stated he can tolerate a low dose  . Mevacor [Lovastatin] Other (See Comments)    "makes me feel loopy"    HOME MEDICATIONS: Outpatient Medications Prior to Visit  Medication Sig Dispense Refill  . amLODipine (NORVASC) 5 MG tablet TAKE 1  TABLET BY MOUTH EVERY DAY 90 tablet 3  . aspirin EC 81 MG tablet Take 81 mg by mouth daily.    Marland Kitchen atorvastatin (LIPITOR) 10 MG tablet Take 1 tablet (10 mg total) by mouth daily. 90 tablet 0  . clopidogrel (PLAVIX) 75 MG tablet TAKE 1 TABLET BY MOUTH EVERY DAY 90 tablet 1  . Coenzyme Q10 (CO Q 10 PO) Take 200 mg by mouth daily.     Marland Kitchen ezetimibe (ZETIA) 10 MG tablet Take 1 tablet (10 mg total) by mouth daily. 90 tablet 2  . gabapentin (NEURONTIN) 800 MG tablet Take 1 tablet (800 mg total) by mouth 3 (three) times daily. (Patient taking differently: Take 800 mg by mouth 2 (two) times daily. ) 270 tablet 1  . lisinopril (ZESTRIL) 20 MG tablet Take 1 tablet (20 mg total) by mouth daily. 90 tablet 2  . Magnesium 500 MG TABS Take 500 mg by mouth daily.    . metoprolol succinate (TOPROL-XL) 25 MG 24 hr tablet TAKE 1 TABLET BY MOUTH EVERY DAY 90 tablet 3  . Multiple Vitamins-Minerals (MULTIVITAMINS THER. W/MINERALS) TABS Take 1 tablet by mouth daily.    . nitroGLYCERIN (NITROSTAT) 0.4 MG SL tablet Place 1 tablet (0.4 mg total) under the tongue every 5 (five) minutes as needed for chest pain. 30 tablet 1  . Omega-3 Fatty Acids (OMEGA 3 PO)  Take 750 mg by mouth daily.     . sildenafil (REVATIO) 20 MG tablet Take 2 tablets (40 mg total) by mouth daily as needed (erectile dysfunction). 50 tablet 0  . Wheat Dextrin (BENEFIBER PO) Take 5 g by mouth daily.    Marland Kitchen zolpidem (AMBIEN) 10 MG tablet TAKE 1 TABLET(10 MG) BY MOUTH AT BEDTIME AS NEEDED FOR SLEEP 90 tablet 0  . cetirizine (ZYRTEC) 10 MG tablet Take 10 mg by mouth every evening.      No facility-administered medications prior to visit.    PAST MEDICAL HISTORY: Past Medical History:  Diagnosis Date  . 1st degree AV block   . Aortic valve disease    a. Mild AS, AI by echo 01/2014.  Marland Kitchen Arthritis   . CAD (coronary artery disease)    a. s/p PTCA 1995. b. MI in 2003 s/p stenting to LAD 2003, s/p stent to Cx 2003. c. prior rotablator to PTCA vessel. d. Abnl  nuc 01/2014: s/p DES to LAD, mild-mod nonobstructive disease otherwise.  . Carpal tunnel syndrome   . Carpal tunnel syndrome   . Carpal tunnel syndrome    right  . GERD (gastroesophageal reflux disease)   . History of kidney stones   . Hyperlipidemia    a. Can only tolerate low dose statins.  . Hypertension   . Insomnia   . Ischemic cardiomyopathy    a. EF 40-45% by echo 02/04/14, 40-45% by cath 02/05/14 (similar to 2010).  . Kidney stone   . Neuropathy   . OSA (obstructive sleep apnea)    with upper airway resistancy  . Pneumonia 2019  . RBBB   . Small intestinal bacterial overgrowth   . Tubular adenoma of colon   . Vertigo    Had Epley maneuver, vestibular rehab.    PAST SURGICAL HISTORY: Past Surgical History:  Procedure Laterality Date  . BELPHAROPTOSIS REPAIR Bilateral 05/30/2017  . CARDIAC CATHETERIZATION  2003   Following MI  . CARPAL TUNNEL RELEASE Left 2016  . CATARACT EXTRACTION, BILATERAL  2004  . CHOLECYSTECTOMY  2009  . COLONOSCOPY W/ POLYPECTOMY    . CORONARY ANGIOPLASTY  2003   LAD  . CORONARY STENT PLACEMENT  2003   LCX  . ESOPHAGOGASTRODUODENOSCOPY    . HERNIA REPAIR Bilateral 1994   double  . KIDNEY STONE SURGERY  10/2005  . KNEE SURGERY Left    arthroscopy   . LEFT HEART CATH  02/05/2014  . LEFT HEART CATHETERIZATION WITH CORONARY ANGIOGRAM N/A 02/05/2014   Procedure: LEFT HEART CATHETERIZATION WITH CORONARY ANGIOGRAM;  Surgeon: Jettie Booze, MD;  Location: [facility] CATH LAB;  Service: Cardiovascular;  Laterality: N/A;  . LITHOTRIPSY  10/2005  . meniscus removal- knee Right   . NASAL SEPTOPLASTY W/ TURBINOPLASTY Bilateral 05/01/2019   Procedure: NASAL SEPTOPLASTY WITH TURBINATE REDUCTION;  Surgeon: Jerrell Belfast, MD;  Location: [facility];  Service: ENT;  Laterality: Bilateral;  . PERCUTANEOUS CORONARY STENT INTERVENTION (PCI-S) Right 02/05/2014   Procedure: PERCUTANEOUS CORONARY STENT INTERVENTION (PCI-S);  Surgeon: Jettie Booze, MD;  Location: [facility]  CATH LAB;  Service: Cardiovascular;  Laterality: Right;  . SHOULDER OPEN ROTATOR CUFF REPAIR Right 1990    FAMILY HISTORY: Family History  Problem Relation Age of Onset  . Heart Problems Mother   . Diabetes Mother   . Parkinson's disease Father 87  . Other Sister        Muscular degeneration  . Heart attack Neg Hx   . Stroke Neg Hx  SOCIAL HISTORY: Social History   Socioeconomic History  . Marital status: Married    Spouse name: [name]  . Number of children: 6  . Years of education: hs  . Highest education level: Not on file  Occupational History    Employer: RETIRED  Tobacco Use  . Smoking status: Former Smoker    Quit date: 1950    Years since quitting: 71.0  . Smokeless tobacco: Never Used  . Tobacco comment: 1968  Substance and Sexual Activity  . Alcohol use: No    Alcohol/week: 0.0 standard drinks    Comment: rarely  . Drug use: No  . Sexual activity: Yes  Other Topics Concern  . Not on file  Social History Narrative   Patient is married [name]).   Patient has four biological children and two adopted children.   Patient is retired from Electronics engineer.   Patient has a high school education.   Patient drinks coffee (12oz) daily.   Patient is right-handed.   Enjoys traveling, working on projects.             Social Determinants of Health   Financial Resource Strain:   . Difficulty of Paying Living Expenses: Not on file  Food Insecurity:   . Worried About Charity fundraiser in the Last Year: Not on file  . Ran Out of Food in the Last Year: Not on file  Transportation Needs:   . Lack of Transportation (Medical): Not on file  . Lack of Transportation (Non-Medical): Not on file  Physical Activity:   . Days of Exercise per Week: Not on file  . Minutes of Exercise per Session: Not on file  Stress:   . Feeling of Stress : Not on file  Social Connections:   . Frequency of Communication with Friends and Family: Not on file  .  Frequency of Social Gatherings with Friends and Family: Not on file  . Attends Religious Services: Not on file  . Active Member of Clubs or Organizations: Not on file  . Attends Archivist Meetings: Not on file  . Marital Status: Not on file  Intimate Partner Violence:   . Fear of Current or Ex-Partner: Not on file  . Emotionally Abused: Not on file  . Physically Abused: Not on file  . Sexually Abused: Not on file      PHYSICAL EXAM  Vitals:   08/15/19 0826  BP: (!) 136/57  Pulse: (!) 50  Temp: (!) 97 F (36.1 C)  TempSrc: Oral  Weight: 218 lb 12.8 oz (99.2 kg)  Height: 6' 0.5" (1.842 m)   Body mass index is 29.27 kg/m.  Generalized: Well developed, in no acute distress  Chest: Lungs clear to auscultation bilaterally  Neurological examination  Mentation: Alert oriented to time, place, history taking. Follows all commands speech and language fluent Cranial nerve II-XII: Extraocular movements were full, visual field were full on confrontational test Head turning and shoulder shrug  were normal and symmetric. Motor: The motor testing reveals 5 over 5 strength of all 4 extremities. Good symmetric motor tone is noted throughout.  Sensory: Sensory testing is intact to soft touch on all 4 extremities. No evidence of extinction is noted.  Gait and station: Gait is normal.    DIAGNOSTIC DATA (LABS, IMAGING, TESTING) - I reviewed patient records, labs, notes, testing and imaging myself where available.  Lab Results  Component Value Date   WBC 6.6 05/01/2019   HGB 14.6 05/01/2019   HCT 40.1  05/01/2019   MCV 101.5 (H) 05/01/2019   PLT 188 05/01/2019      Component Value Date/Time   NA 139 04/29/2019 0848   NA 138 08/07/2018 0815   K 4.1 04/29/2019 0848   CL 103 04/29/2019 0848   CO2 28 04/29/2019 0848   GLUCOSE 176 (H) 04/29/2019 0848   BUN 17 04/29/2019 0848   BUN 19 08/07/2018 0815   CREATININE 1.22 04/29/2019 0848   CREATININE 1.12 04/06/2016 0920    CALCIUM 8.8 04/29/2019 0848   PROT 7.0 02/28/2019 1442   PROT 6.7 08/07/2018 0815   ALBUMIN 4.3 02/28/2019 1442   ALBUMIN 4.0 08/07/2018 0815   AST 22 02/28/2019 1442   ALT 20 02/28/2019 1442   ALKPHOS 43 02/28/2019 1442   BILITOT 0.8 02/28/2019 1442   BILITOT 0.6 08/07/2018 0815   GFRNONAA 55 (L) 08/07/2018 0815   GFRAA 63 08/07/2018 0815   Lab Results  Component Value Date   CHOL 155 02/28/2019   HDL 48.80 02/28/2019   LDLCALC 71 02/28/2019   LDLDIRECT 145.5 09/16/2013   TRIG 175.0 (H) 02/28/2019   CHOLHDL 3 02/28/2019   Lab Results  Component Value Date   HGBA1C 6.1 02/28/2019      ASSESSMENT AND PLAN 85 y.o. year old male  has a past medical history of 1st degree AV block, Aortic valve disease, Arthritis, CAD (coronary artery disease), Carpal tunnel syndrome, Carpal tunnel syndrome, Carpal tunnel syndrome, GERD (gastroesophageal reflux disease), History of kidney stones, Hyperlipidemia, Hypertension, Insomnia, Ischemic cardiomyopathy, Kidney stone, Neuropathy, OSA (obstructive sleep apnea), Pneumonia (2019), RBBB, Small intestinal bacterial overgrowth, Tubular adenoma of colon, and Vertigo. here with:  1. Obstructive sleep apnea on CPAP  The patient's CPAP download shows excellent compliance and good treatment of his apnea.  He is encouraged to continue using CPAP nightly and greater than 4 hours each night.  He is advised that if his symptoms worsen or he develops new symptoms he should let us know.  He will follow-up in 1 year or sooner if needed      I spent 15 minutes with the patient. 50% of this time was spent reviewing CPAP download  Ward Givens, MSN, NP-C 08/15/2019, 8:41 AM [facility] [address], [place], [place] [address] [phone]

## 2019-08-15 NOTE — Patient Instructions (Signed)
Continue using CPAP nightly and greater than 4 hours each night °If your symptoms worsen or you develop new symptoms please let us know.  ° °

## 2019-08-23 ENCOUNTER — Other Ambulatory Visit: Payer: Self-pay | Admitting: Cardiovascular Disease

## 2019-08-23 MED ORDER — CLOPIDOGREL BISULFATE 75 MG PO TABS: 75.0000 mg | ORAL_TABLET | Freq: Every day | ORAL | 2 refills | Status: AC

## 2019-09-24 ENCOUNTER — Encounter: Payer: Self-pay | Admitting: Cardiovascular Disease

## 2019-09-24 NOTE — Progress Notes (Signed)
Cardiology Office Note   Date:  09/25/2019   ID:  Gabriel [name], DOB 1935-03-24, MRN [id number]  PCP:  Pleas Koch, NP  Cardiologist:   Mertie Moores, MD , previous [facility] patient   Chief Complaint  Patient presents with  . Coronary Artery Disease  . Hypertension  . Hyperlipidemia   Problem list 1. Coronary artery disease 2. Mild systolic congestive heart failure due to coronary artery disease 3. Essential hypertension 4. Hyperlipidemia 5. Aortic stenosis 6. Abdominal bloating      Gabriel [name] is a 85 y.o. male who presents for his CAD  I preformed several PCIs in the 1995s - 2000  Had a recent PCI in July 2015.   ( mid LAD)   Not getting much regular exercise .    Cath in July 2015:  IMPRESSIONS:  1. Normal left main coronary artery. 2. 90% focal stenosis in the mid left anterior descending artery. This was successfully treated with a 2.25 x 12 promus drug-eluting stent, postdilated to 2.6 mm in diameter. Previously placed stent in the more proximal portion of the LAD is widely patent. 3. Patent stents in the mid left circumflex artery. Mild to moderate disease in the circumflex system. 4. Mild to moderate disease right coronary artery system. The distal RCA stent has mild in-stent restenosis. 5. Mildly decreased left ventricular systolic function. LVEDP 19 mmHg. Ejection fraction 40-45%.  Sept. 6, 2017:  Doing well.   Had left knee arthroscopy recently.  No complications. No CP , no dyspnea BP is well controlled   October 17, 2016:  Doing well His knee is feeling better  Recently went to [place] to spring training   Sept. 27, 2018:  Doing well Hx of stenting  No CP , no dyspnea.   Stays busy,  remodling the activity building at church.  Not as much aerobic exercise Needs a knee replacement.   Able to do some walking  Encouraged him to use an exercise   November 23, 2016:  [name] was in the hospital for 4 days with pneumonia.  He still  feels fairly fatigued.  His breathing has improved. 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   February 14, 2018: There is seen back for follow-up visit.  He was having some episodes of chest discomfort when I saw him in April. Myoview study revealed a previous inferolateral apical myocardial infarction.  There was no evidence of ischemia.  Ejection fraction is 41%. Echocardiogram shows left ventricular systolic function with an EF of 35 to 40%.  He has grade 1 diastolic dysfunction.  He has mild to moderate aortic stenosis mild aortic insufficiency.  He has noticed that his CP have resolved after he improved his diet .   Has decreased his spicy and greasy foods.   Is traveling to the [place] in several weeks.   Is a retired Environmental education officer.   Will be speaking   Jan. 13, 2020 Doing well.   No further chest pain  Exercise some.   Busy with projects  No leg edema.   Breathing is ok  Aug. 28. 2020  [name] is seen today for follow up of his AS/AI.    Has a hx of CAD - s/p several PCIs around 1995 - 2000 Hx of hyperlipidemia, OSA Is constantly lightheaded.   Worse  with standing from a sitting position   May not drink enough water   Needs deviated septum surgery  Is on ASA and plavix  Has not had any angina   Does lots of exertional activities with any significant angina   Feb. 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   Has helped several family members more   Past Medical History:  Diagnosis Date  . 1st degree AV block   . Aortic valve disease    a. Mild AS, AI by echo 01/2014.  Marland Kitchen Arthritis    . CAD (coronary artery disease)    a. s/p PTCA 1995. b. MI in 2003 s/p stenting to LAD 2003, s/p stent to Cx 2003. c. prior rotablator to PTCA vessel. d. Abnl nuc 01/2014: s/p DES to LAD, mild-mod nonobstructive disease otherwise.  . Carpal tunnel syndrome   . Carpal tunnel syndrome   . Carpal tunnel syndrome    right  . GERD (gastroesophageal reflux disease)   . History of kidney stones   . Hyperlipidemia    a. Can only tolerate low dose statins.  . Hypertension   . Insomnia   . Ischemic cardiomyopathy    a. EF 40-45% by echo 02/04/14, 40-45% by cath 02/05/14 (similar to 2010).  . Kidney stone   . Neuropathy   . OSA (obstructive sleep apnea)    with upper airway resistancy  . Pneumonia 2019  . RBBB   . Small intestinal bacterial overgrowth   . Tubular adenoma of colon   . Vertigo    Had Epley maneuver, vestibular rehab.    Past Surgical History:  Procedure Laterality Date  . BELPHAROPTOSIS REPAIR Bilateral 05/30/2017  . CARDIAC CATHETERIZATION  2003   Following MI  . CARPAL TUNNEL RELEASE Left 2016  . CATARACT EXTRACTION, BILATERAL  2004  . CHOLECYSTECTOMY  2009  . COLONOSCOPY W/ POLYPECTOMY    . CORONARY ANGIOPLASTY  2003   LAD  . CORONARY STENT PLACEMENT  2003   LCX  . ESOPHAGOGASTRODUODENOSCOPY    . HERNIA REPAIR Bilateral 1994   double  . KIDNEY STONE SURGERY  10/2005  . KNEE SURGERY Left    arthroscopy   . LEFT HEART CATH  02/05/2014  . LEFT HEART CATHETERIZATION WITH CORONARY ANGIOGRAM N/A 02/05/2014   Procedure: LEFT HEART CATHETERIZATION WITH CORONARY ANGIOGRAM;  Surgeon: Jettie Booze, MD;  Location: [facility] CATH LAB;  Service: Cardiovascular;  Laterality: N/A;  . LITHOTRIPSY  10/2005  . meniscus removal- knee Right   . NASAL SEPTOPLASTY W/ TURBINOPLASTY Bilateral 05/01/2019   Procedure: NASAL SEPTOPLASTY WITH TURBINATE REDUCTION;  Surgeon: Jerrell Belfast, MD;  Location: [facility];  Service: ENT;  Laterality: Bilateral;  . PERCUTANEOUS CORONARY STENT INTERVENTION  (PCI-S) Right 02/05/2014   Procedure: PERCUTANEOUS CORONARY STENT INTERVENTION (PCI-S);  Surgeon: Jettie Booze, MD;  Location: [facility] CATH LAB;  Service: Cardiovascular;  Laterality: Right;  . SHOULDER OPEN ROTATOR CUFF REPAIR Right 1990     Current Outpatient Medications  Medication Sig Dispense Refill  . amLODipine (NORVASC) 5 MG tablet TAKE 1 TABLET BY MOUTH EVERY DAY 90 tablet 3  . aspirin EC 81 MG tablet Take 81 mg by mouth daily.    Marland Kitchen  atorvastatin (LIPITOR) 10 MG tablet Take 1 tablet (10 mg total) by mouth daily. 90 tablet 0  . clopidogrel (PLAVIX) 75 MG tablet Take 1 tablet (75 mg total) by mouth daily. 90 tablet 2  . Coenzyme Q10 (CO Q 10 PO) Take 200 mg by mouth daily.     Marland Kitchen ezetimibe (ZETIA) 10 MG tablet Take 1 tablet (10 mg total) by mouth daily. 90 tablet 2  . gabapentin (NEURONTIN) 800 MG tablet Take 1 tablet (800 mg total) by mouth 3 (three) times daily. (Patient taking differently: Take 800 mg by mouth 2 (two) times daily. ) 270 tablet 1  . lisinopril (ZESTRIL) 20 MG tablet Take 1 tablet (20 mg total) by mouth daily. 90 tablet 2  . Magnesium 500 MG TABS Take 500 mg by mouth daily.    . metoprolol succinate (TOPROL-XL) 25 MG 24 hr tablet TAKE 1 TABLET BY MOUTH EVERY DAY 90 tablet 3  . Multiple Vitamins-Minerals (MULTIVITAMINS THER. W/MINERALS) TABS Take 1 tablet by mouth daily.    . nitroGLYCERIN (NITROSTAT) 0.4 MG SL tablet Place 1 tablet (0.4 mg total) under the tongue every 5 (five) minutes as needed for chest pain. 30 tablet 1  . Omega-3 Fatty Acids (OMEGA 3 PO) Take 750 mg by mouth daily.     . sildenafil (REVATIO) 20 MG tablet Take 2 tablets (40 mg total) by mouth daily as needed (erectile dysfunction). 50 tablet 0  . Wheat Dextrin (BENEFIBER PO) Take 5 g by mouth daily.    Marland Kitchen zolpidem (AMBIEN) 10 MG tablet TAKE 1 TABLET(10 MG) BY MOUTH AT BEDTIME AS NEEDED FOR SLEEP 90 tablet 0   No current facility-administered medications for this visit.    Allergies:   Lipitor  [atorvastatin calcium] and Mevacor [lovastatin]    Social History:  The patient  reports that he quit smoking about 71 years ago. He has never used smokeless tobacco. He reports that he does not drink alcohol or use drugs.   Family History:  The patient's family history includes Diabetes in his mother; Heart Problems in his mother; Other in his sister; Parkinson's disease (age of onset: 2) in his father.    ROS:   Noted in current history, otherwise review of systems is negative.  Physical Exam: Blood pressure 118/68, pulse 67, height 6' (1.829 m), weight 216 lb (98 kg).  GEN:  Well nourished, well developed in no acute distress HEENT: Normal NECK: No JVD; No carotid bruits LYMPHATICS: No lymphadenopathy CARDIAC: RRR , no murmurs, rubs, gallops RESPIRATORY:  Clear to auscultation without rales, wheezing or rhonchi  ABDOMEN: Soft, non-tender, non-distended MUSCULOSKELETAL:  No edema; No deformity  SKIN: Warm and dry NEUROLOGIC:  Alert and oriented x 3     EKG:     Feb. 24, 2021:  NSR at 67.   1st degree AV block  No changes.    Recent Labs: 02/28/2019: ALT 20 04/29/2019: BUN 17; Creatinine, Ser 1.22; Potassium 4.1; Sodium 139 05/01/2019: Hemoglobin 14.6; Platelets 188    Lipid Panel    Component Value Date/Time   CHOL 155 02/28/2019 1442   CHOL 155 08/07/2018 0815   TRIG 175.0 (H) 02/28/2019 1442   HDL 48.80 02/28/2019 1442   HDL 50 08/07/2018 0815   CHOLHDL 3 02/28/2019 1442   VLDL 35.0 02/28/2019 1442   LDLCALC 71 02/28/2019 1442   LDLCALC 70 08/07/2018 0815   LDLDIRECT 145.5 09/16/2013 0811      Wt Readings from Last 3 Encounters:  09/25/19 216 lb (  98 kg)  08/15/19 218 lb 12.8 oz (99.2 kg)  05/01/19 212 lb (96.2 kg)      Other studies Reviewed: Additional studies/ records that were reviewed today include: . Review of the above records demonstrates:    ASSESSMENT AND PLAN:  1. Coronary artery disease -   he is not having any episodes of angina.   Continue current medications.   2. Mild systolic congestive heart failure : Is not having any severe episodes of heart failure.  He will continue current medications.  3. Essential hypertension .   Blood pressure is well controlled.  4. Hyperlipidemia-    lipids have been stable.  We will recheck his labs next week.  See me in 6 months for follow-up visit.  5. Aortic stenosis/ aortic insufficiency  -his murmur sounds stable.    Current medicines are reviewed at length with the patient today.  The patient does not have concerns regarding medicines.  The following changes have been made:  no change  Labs/ tests ordered today include:   Orders Placed This Encounter  Procedures  . EKG 12-Lead     Disposition:   FU with me in 6 months with lipid levels, liver enzymes, and basic metabolic profile.    Mertie Moores, MD  09/25/2019 9:50 AM    [facility] [place], [place], [place]  [address] Phone: [phone]; Fax: [phone]

## 2019-09-25 ENCOUNTER — Other Ambulatory Visit: Payer: Self-pay

## 2019-09-25 ENCOUNTER — Ambulatory Visit (INDEPENDENT_AMBULATORY_CARE_PROVIDER_SITE_OTHER): Payer: Medicare Other | Admitting: Cardiovascular Disease

## 2019-09-25 VITALS — BP 118/68 | HR 67 | Ht 72.0 in | Wt 216.0 lb

## 2019-09-25 DIAGNOSIS — I5043 Acute on chronic combined systolic (congestive) and diastolic (congestive) heart failure: Secondary | ICD-10-CM

## 2019-09-25 DIAGNOSIS — I2510 Atherosclerotic heart disease of native coronary artery without angina pectoris: Secondary | ICD-10-CM | POA: Diagnosis not present

## 2019-09-25 DIAGNOSIS — I10 Essential (primary) hypertension: Secondary | ICD-10-CM

## 2019-09-25 DIAGNOSIS — I255 Ischemic cardiomyopathy: Secondary | ICD-10-CM | POA: Diagnosis not present

## 2019-09-25 DIAGNOSIS — I440 Atrioventricular block, first degree: Secondary | ICD-10-CM

## 2019-09-25 NOTE — Patient Instructions (Addendum)
Your physician recommends that you continue on your current medications as directed. Please refer to the Current Medication list given to you today.   Your physician recommends that you return for lab work in:  Monday FASTING BMET LIPID AND LIVER    Your physician wants you to follow-up in: [place] will receive a reminder letter in the mail two months in advance. If you don't receive a letter, please call our office to schedule the follow-up appointment.

## 2019-09-30 ENCOUNTER — Other Ambulatory Visit: Payer: Medicare Other | Admitting: *Deleted

## 2019-09-30 ENCOUNTER — Other Ambulatory Visit: Payer: Self-pay

## 2019-09-30 DIAGNOSIS — I2510 Atherosclerotic heart disease of native coronary artery without angina pectoris: Secondary | ICD-10-CM | POA: Diagnosis not present

## 2019-09-30 DIAGNOSIS — I10 Essential (primary) hypertension: Secondary | ICD-10-CM | POA: Diagnosis not present

## 2019-09-30 LAB — BASIC METABOLIC PANEL
BUN/Creatinine Ratio: 15 (ref 10–24)
BUN: 20 mg/dL (ref 8–27)
CO2: 25 mmol/L (ref 20–29)
Calcium: 9.2 mg/dL (ref 8.6–10.2)
Chloride: 103 mmol/L (ref 96–106)
Creatinine, Ser: 1.33 mg/dL — ABNORMAL HIGH (ref 0.76–1.27)
GFR calc Af Amer: 57 mL/min/{1.73_m2} — ABNORMAL LOW (ref >59)
GFR calc non Af Amer: 49 mL/min/{1.73_m2} — ABNORMAL LOW (ref >59)
Glucose: 121 mg/dL — ABNORMAL HIGH (ref 65–99)
Potassium: 4.8 mmol/L (ref 3.5–5.2)
Sodium: 141 mmol/L (ref 134–144)

## 2019-09-30 LAB — LIPID PANEL
Chol/HDL Ratio: 2.9 ratio (ref 0.0–5.0)
Cholesterol, Total: 145 mg/dL (ref 100–199)
HDL: 50 mg/dL (ref >39)
LDL Chol Calc (NIH): 69 mg/dL (ref 0–99)
Triglycerides: 155 mg/dL — ABNORMAL HIGH (ref 0–149)
VLDL Cholesterol Cal: 26 mg/dL (ref 5–40)

## 2019-09-30 LAB — HEPATIC FUNCTION PANEL
ALT: 23 IU/L (ref 0–44)
AST: 26 IU/L (ref 0–40)
Albumin: 4.1 g/dL (ref 3.6–4.6)
Alkaline Phosphatase: 56 IU/L (ref 39–117)
Bilirubin Total: 0.9 mg/dL (ref 0.0–1.2)
Bilirubin, Direct: 0.28 mg/dL (ref 0.00–0.40)
Total Protein: 6.9 g/dL (ref 6.0–8.5)

## 2019-10-18 ENCOUNTER — Other Ambulatory Visit: Payer: Self-pay | Admitting: Cardiovascular Disease

## 2019-10-18 MED ORDER — ATORVASTATIN CALCIUM 10 MG PO TABS: 10.0000 mg | ORAL_TABLET | Freq: Every day | ORAL | 3 refills | Status: AC

## 2019-11-05 ENCOUNTER — Other Ambulatory Visit: Payer: Self-pay | Admitting: Primary Care

## 2019-11-05 DIAGNOSIS — G4700 Insomnia, unspecified: Secondary | ICD-10-CM

## 2019-11-07 NOTE — Telephone Encounter (Signed)
Noted, refill sent to pharmacy. 

## 2019-11-07 NOTE — Telephone Encounter (Signed)
Last prescribed on 08/09/2019 #90 with 0 refill. Last appointment on 02/28/2019. No future appointment

## 2019-11-20 ENCOUNTER — Other Ambulatory Visit: Payer: Self-pay | Admitting: Neurology

## 2020-02-07 ENCOUNTER — Other Ambulatory Visit: Payer: Self-pay | Admitting: Primary Care

## 2020-02-07 DIAGNOSIS — G4700 Insomnia, unspecified: Secondary | ICD-10-CM

## 2020-02-07 NOTE — Telephone Encounter (Signed)
Last prescribed on 11/07/2019 Last OV (cpe) with Allie Bossier on 03/01/2019 No future OV scheduled

## 2020-02-07 NOTE — Telephone Encounter (Signed)
Please notify patient that he is due for his wellness visit with Dewitt Hoes and annual follow up with me. I cannot refill his Ambien until he is scheduled.  Please schedule both visits.

## 2020-02-10 NOTE — Telephone Encounter (Signed)
Patient called in and scheduled visits with Dewitt Hoes and PCP. Please advise.

## 2020-02-10 NOTE — Telephone Encounter (Signed)
Refills sent to pharmacy. 

## 2020-02-14 ENCOUNTER — Other Ambulatory Visit: Payer: Self-pay | Admitting: Primary Care

## 2020-02-14 DIAGNOSIS — I10 Essential (primary) hypertension: Secondary | ICD-10-CM

## 2020-02-14 DIAGNOSIS — R7303 Prediabetes: Secondary | ICD-10-CM

## 2020-02-14 DIAGNOSIS — E7800 Pure hypercholesterolemia, unspecified: Secondary | ICD-10-CM

## 2020-02-15 ENCOUNTER — Other Ambulatory Visit: Payer: Self-pay | Admitting: Cardiovascular Disease

## 2020-02-19 ENCOUNTER — Other Ambulatory Visit: Payer: Self-pay | Admitting: *Deleted

## 2020-02-19 MED ORDER — LISINOPRIL 20 MG PO TABS: 20.0000 mg | ORAL_TABLET | Freq: Every day | ORAL | 1 refills | Status: AC

## 2020-02-26 ENCOUNTER — Other Ambulatory Visit: Payer: Self-pay

## 2020-02-26 ENCOUNTER — Ambulatory Visit (INDEPENDENT_AMBULATORY_CARE_PROVIDER_SITE_OTHER): Payer: Medicare Other

## 2020-02-26 DIAGNOSIS — Z0000 Encounter for general adult medical examination without abnormal findings: Secondary | ICD-10-CM

## 2020-02-26 NOTE — Patient Instructions (Signed)
Gabriel [name] , Thank you for taking time to come for your Medicare Wellness Visit. I appreciate your ongoing commitment to your health goals. Please review the following plan we discussed and let me know if I can assist you in the future.   Screening recommendations/referrals: Colonoscopy: Up to date, completed 02/15/2017, no longer required Recommended yearly ophthalmology/optometry visit for glaucoma screening and checkup Recommended yearly dental visit for hygiene and checkup  Vaccinations: Influenza vaccine: due Fall 2021 Pneumococcal vaccine: Completed series Tdap vaccine: Up to date, completed 08/02/2011, due 08/2021 Shingles vaccine: due, check with your insurance regarding coverage   Covid-19: Completed series  Advanced directives: copy in chart  Conditions/risks identified: hypertension  Next appointment: Follow up in one year for your annual wellness visit.   Preventive Care 83 Years and Older, Male Preventive care refers to lifestyle choices and visits with your health care provider that can promote health and wellness. What does preventive care include?  A yearly physical exam. This is also called an annual well check.  Dental exams once or twice a year.  Routine eye exams. Ask your health care provider how often you should have your eyes checked.  Personal lifestyle choices, including:  Daily care of your teeth and gums.  Regular physical activity.  Eating a healthy diet.  Avoiding tobacco and drug use.  Limiting alcohol use.  Practicing safe sex.  Taking low doses of aspirin every day.  Taking vitamin and mineral supplements as recommended by your health care provider. What happens during an annual well check? The services and screenings done by your health care provider during your annual well check will depend on your age, overall health, lifestyle risk factors, and family history of disease. Counseling  Your health care provider may ask you questions about  your:  Alcohol use.  Tobacco use.  Drug use.  Emotional well-being.  Home and relationship well-being.  Sexual activity.  Eating habits.  History of falls.  Memory and ability to understand (cognition).  Work and work Statistician. Screening  You may have the following tests or measurements:  Height, weight, and BMI.  Blood pressure.  Lipid and cholesterol levels. These may be checked every 5 years, or more frequently if you are over 5 years old.  Skin check.  Lung cancer screening. You may have this screening every year starting at age 41 if you have a 30-pack-year history of smoking and currently smoke or have quit within the past 15 years.  Fecal occult blood test (FOBT) of the stool. You may have this test every year starting at age 8.  Flexible sigmoidoscopy or colonoscopy. You may have a sigmoidoscopy every 5 years or a colonoscopy every 10 years starting at age 22.  Prostate cancer screening. Recommendations will vary depending on your family history and other risks.  Hepatitis C blood test.  Hepatitis B blood test.  Sexually transmitted disease (STD) testing.  Diabetes screening. This is done by checking your blood sugar (glucose) after you have not eaten for a while (fasting). You may have this done every 1-3 years.  Abdominal aortic aneurysm (AAA) screening. You may need this if you are a current or former smoker.  Osteoporosis. You may be screened starting at age 27 if you are at high risk. Talk with your health care provider about your test results, treatment options, and if necessary, the need for more tests. Vaccines  Your health care provider may recommend certain vaccines, such as:  Influenza vaccine. This is recommended every  year.  Tetanus, diphtheria, and acellular pertussis (Tdap, Td) vaccine. You may need a Td booster every 10 years.  Zoster vaccine. You may need this after age 5.  Pneumococcal 13-valent conjugate (PCV13) vaccine.  One dose is recommended after age 79.  Pneumococcal polysaccharide (PPSV23) vaccine. One dose is recommended after age 78. Talk to your health care provider about which screenings and vaccines you need and how often you need them. This information is not intended to replace advice given to you by your health care provider. Make sure you discuss any questions you have with your health care provider. Document Released: 08/14/2015 Document Revised: 04/06/2016 Document Reviewed: 05/19/2015 Elsevier Interactive Patient Education  2017 [place] Prevention in the Home Falls can cause injuries. They can happen to people of all ages. There are many things you can do to make your home safe and to help prevent falls. What can I do on the outside of my home?  Regularly fix the edges of walkways and driveways and fix any cracks.  Remove anything that might make you trip as you walk through a door, such as a raised step or threshold.  Trim any bushes or trees on the path to your home.  Use bright outdoor lighting.  Clear any walking paths of anything that might make someone trip, such as rocks or tools.  Regularly check to see if handrails are loose or broken. Make sure that both sides of any steps have handrails.  Any raised decks and porches should have guardrails on the edges.  Have any leaves, snow, or ice cleared regularly.  Use sand or salt on walking paths during winter.  Clean up any spills in your garage right away. This includes oil or grease spills. What can I do in the bathroom?  Use night lights.  Install grab bars by the toilet and in the tub and shower. Do not use towel bars as grab bars.  Use non-skid mats or decals in the tub or shower.  If you need to sit down in the shower, use a plastic, non-slip stool.  Keep the floor dry. Clean up any water that spills on the floor as soon as it happens.  Remove soap buildup in the tub or shower regularly.  Attach bath  mats securely with double-sided non-slip rug tape.  Do not have throw rugs and other things on the floor that can make you trip. What can I do in the bedroom?  Use night lights.  Make sure that you have a light by your bed that is easy to reach.  Do not use any sheets or blankets that are too big for your bed. They should not hang down onto the floor.  Have a firm chair that has side arms. You can use this for support while you get dressed.  Do not have throw rugs and other things on the floor that can make you trip. What can I do in the kitchen?  Clean up any spills right away.  Avoid walking on wet floors.  Keep items that you use a lot in easy-to-reach places.  If you need to reach something above you, use a strong step stool that has a grab bar.  Keep electrical cords out of the way.  Do not use floor polish or wax that makes floors slippery. If you must use wax, use non-skid floor wax.  Do not have throw rugs and other things on the floor that can make you trip. What can  I do with my stairs?  Do not leave any items on the stairs.  Make sure that there are handrails on both sides of the stairs and use them. Fix handrails that are broken or loose. Make sure that handrails are as long as the stairways.  Check any carpeting to make sure that it is firmly attached to the stairs. Fix any carpet that is loose or worn.  Avoid having throw rugs at the top or bottom of the stairs. If you do have throw rugs, attach them to the floor with carpet tape.  Make sure that you have a light switch at the top of the stairs and the bottom of the stairs. If you do not have them, ask someone to add them for you. What else can I do to help prevent falls?  Wear shoes that:  Do not have high heels.  Have rubber bottoms.  Are comfortable and fit you well.  Are closed at the toe. Do not wear sandals.  If you use a stepladder:  Make sure that it is fully opened. Do not climb a closed  stepladder.  Make sure that both sides of the stepladder are locked into place.  Ask someone to hold it for you, if possible.  Clearly mark and make sure that you can see:  Any grab bars or handrails.  First and last steps.  Where the edge of each step is.  Use tools that help you move around (mobility aids) if they are needed. These include:  Canes.  Walkers.  Scooters.  Crutches.  Turn on the lights when you go into a dark area. Replace any light bulbs as soon as they burn out.  Set up your furniture so you have a clear path. Avoid moving your furniture around.  If any of your floors are uneven, fix them.  If there are any pets around you, be aware of where they are.  Review your medicines with your doctor. Some medicines can make you feel dizzy. This can increase your chance of falling. Ask your doctor what other things that you can do to help prevent falls. This information is not intended to replace advice given to you by your health care provider. Make sure you discuss any questions you have with your health care provider. Document Released: 05/14/2009 Document Revised: 12/24/2015 Document Reviewed: 08/22/2014 Elsevier Interactive Patient Education  2017 [employer].

## 2020-02-26 NOTE — Progress Notes (Signed)
Subjective:   Gabriel [name] is a 85 y.o. male who presents for Medicare Annual/Subsequent preventive examination.  Review of Systems: N/A      I connected with the patient today by telephone and verified that I am speaking with the correct person using two identifiers. Location patient: home Location nurse: work Persons participating in the virtual visit: patient, Marine scientist.   I discussed the limitations, risks, security and privacy concerns of performing an evaluation and management service by telephone and the availability of in person appointments. I also discussed with the patient that there may be a patient responsible charge related to this service. The patient expressed understanding and verbally consented to this telephonic visit.    Interactive audio and video telecommunications were attempted between this nurse and patient, however failed, due to patient having technical difficulties OR patient did not have access to video capability.  We continued and completed visit with audio only.     Cardiac Risk Factors include: advanced age (>47men, >15 women);male gender;hypertension     Objective:    Today's Vitals   There is no height or weight on file to calculate BMI.  Advanced Directives 02/26/2020 05/01/2019 02/21/2018 10/18/2017 02/15/2017 02/14/2017 02/05/2014  Does Patient Have a Medical Advance Directive? Yes Yes Yes Yes Yes Yes Patient has advance directive, copy not in chart  Type of Advance Directive [place];Living will [place];Living will [place];Living will [place];Living will - [place];Living will [place];Living will  Does patient want to make changes to medical advance directive? - No - Patient declined - No - Patient declined - - No change requested  Copy of [place] in Chart? Yes - validated most recent copy scanned in chart (See  row information) No - copy requested No - copy requested Yes - Yes Copy requested from family    Current Medications (verified) Outpatient Encounter Medications as of 02/26/2020  Medication Sig  . amLODipine (NORVASC) 5 MG tablet TAKE 1 TABLET BY MOUTH EVERY DAY  . aspirin EC 81 MG tablet Take 81 mg by mouth daily.  Marland Kitchen atorvastatin (LIPITOR) 10 MG tablet Take 1 tablet (10 mg total) by mouth daily.  . clopidogrel (PLAVIX) 75 MG tablet Take 1 tablet (75 mg total) by mouth daily.  . Coenzyme Q10 (CO Q 10 PO) Take 200 mg by mouth daily.   Marland Kitchen ezetimibe (ZETIA) 10 MG tablet TAKE 1 TABLET(10 MG) BY MOUTH DAILY  . gabapentin (NEURONTIN) 800 MG tablet TAKE 1 TABLET(800 MG) BY MOUTH THREE TIMES DAILY  . lisinopril (ZESTRIL) 20 MG tablet Take 1 tablet (20 mg total) by mouth daily.  . Magnesium 500 MG TABS Take 500 mg by mouth daily.  . metoprolol succinate (TOPROL-XL) 25 MG 24 hr tablet TAKE 1 TABLET BY MOUTH EVERY DAY  . Multiple Vitamins-Minerals (MULTIVITAMINS THER. W/MINERALS) TABS Take 1 tablet by mouth daily.  . nitroGLYCERIN (NITROSTAT) 0.4 MG SL tablet Place 1 tablet (0.4 mg total) under the tongue every 5 (five) minutes as needed for chest pain.  . Omega-3 Fatty Acids (OMEGA 3 PO) Take 750 mg by mouth daily.   . sildenafil (REVATIO) 20 MG tablet Take 2 tablets (40 mg total) by mouth daily as needed (erectile dysfunction).  . Wheat Dextrin (BENEFIBER PO) Take 5 g by mouth daily.  Marland Kitchen zolpidem (AMBIEN) 10 MG tablet TAKE 1 TABLET(10 MG) BY MOUTH AT BEDTIME AS NEEDED FOR SLEEP  No facility-administered encounter medications on file as of 02/26/2020.    Allergies (verified) Lipitor [atorvastatin calcium] and Mevacor [lovastatin]   History: Past Medical History:  Diagnosis Date  . 1st degree AV block   . Aortic valve disease    a. Mild AS, AI by echo 01/2014.  Marland Kitchen Arthritis   . CAD (coronary artery disease)    a. s/p PTCA 1995. b. MI in 2003 s/p stenting to LAD 2003, s/p stent to Cx 2003. c.  prior rotablator to PTCA vessel. d. Abnl nuc 01/2014: s/p DES to LAD, mild-mod nonobstructive disease otherwise.  . Carpal tunnel syndrome   . Carpal tunnel syndrome   . Carpal tunnel syndrome    right  . GERD (gastroesophageal reflux disease)   . History of kidney stones   . Hyperlipidemia    a. Can only tolerate low dose statins.  . Hypertension   . Insomnia   . Ischemic cardiomyopathy    a. EF 40-45% by echo 02/04/14, 40-45% by cath 02/05/14 (similar to 2010).  . Kidney stone   . Neuropathy   . OSA (obstructive sleep apnea)    with upper airway resistancy  . Pneumonia 2019  . RBBB   . Small intestinal bacterial overgrowth   . Tubular adenoma of colon   . Vertigo    Had Epley maneuver, vestibular rehab.   Past Surgical History:  Procedure Laterality Date  . BELPHAROPTOSIS REPAIR Bilateral 05/30/2017  . CARDIAC CATHETERIZATION  2003   Following MI  . CARPAL TUNNEL RELEASE Left 2016  . CATARACT EXTRACTION, BILATERAL  2004  . CHOLECYSTECTOMY  2009  . COLONOSCOPY W/ POLYPECTOMY    . CORONARY ANGIOPLASTY  2003   LAD  . CORONARY STENT PLACEMENT  2003   LCX  . ESOPHAGOGASTRODUODENOSCOPY    . HERNIA REPAIR Bilateral 1994   double  . KIDNEY STONE SURGERY  10/2005  . KNEE SURGERY Left    arthroscopy   . LEFT HEART CATH  02/05/2014  . LEFT HEART CATHETERIZATION WITH CORONARY ANGIOGRAM N/A 02/05/2014   Procedure: LEFT HEART CATHETERIZATION WITH CORONARY ANGIOGRAM;  Surgeon: Jettie Booze, MD;  Location: [facility] CATH LAB;  Service: Cardiovascular;  Laterality: N/A;  . LITHOTRIPSY  10/2005  . meniscus removal- knee Right   . NASAL SEPTOPLASTY W/ TURBINOPLASTY Bilateral 05/01/2019   Procedure: NASAL SEPTOPLASTY WITH TURBINATE REDUCTION;  Surgeon: Jerrell Belfast, MD;  Location: [facility];  Service: ENT;  Laterality: Bilateral;  . PERCUTANEOUS CORONARY STENT INTERVENTION (PCI-S) Right 02/05/2014   Procedure: PERCUTANEOUS CORONARY STENT INTERVENTION (PCI-S);  Surgeon: Jettie Booze, MD;   Location: [facility] CATH LAB;  Service: Cardiovascular;  Laterality: Right;  . SHOULDER OPEN ROTATOR CUFF REPAIR Right 1990   Family History  Problem Relation Age of Onset  . Heart Problems Mother   . Diabetes Mother   . Parkinson's disease Father 12  . Other Sister        Muscular degeneration  . Heart attack Neg Hx   . Stroke Neg Hx    Social History   Socioeconomic History  . Marital status: Married    Spouse name: [name]  . Number of children: 6  . Years of education: hs  . Highest education level: Not on file  Occupational History    Employer: RETIRED  Tobacco Use  . Smoking status: Former Smoker    Quit date: 1950    Years since quitting: 82.6  . Smokeless tobacco: Never Used  . Tobacco comment: 1968  Vaping Use  .  Vaping Use: Never used  Substance and Sexual Activity  . Alcohol use: No    Alcohol/week: 0.0 standard drinks    Comment: rarely  . Drug use: No  . Sexual activity: Yes  Other Topics Concern  . Not on file  Social History Narrative   Patient is married [name]).   Patient has four biological children and two adopted children.   Patient is retired from Electronics engineer.   Patient has a high school education.   Patient drinks coffee (12oz) daily.   Patient is right-handed.   Enjoys traveling, working on projects.             Social Determinants of Health   Financial Resource Strain: Low Risk   . Difficulty of Paying Living Expenses: Not hard at all  Food Insecurity: No Food Insecurity  . Worried About Charity fundraiser in the Last Year: Never true  . Ran Out of Food in the Last Year: Never true  Transportation Needs: No Transportation Needs  . Lack of Transportation (Medical): No  . Lack of Transportation (Non-Medical): No  Physical Activity: Inactive  . Days of Exercise per Week: 0 days  . Minutes of Exercise per Session: 0 min  Stress: No Stress Concern Present  . Feeling of Stress : Not at all  Social Connections:   .  Frequency of Communication with Friends and Family:   . Frequency of Social Gatherings with Friends and Family:   . Attends Religious Services:   . Active Member of Clubs or Organizations:   . Attends Archivist Meetings:   Marland Kitchen Marital Status:     Tobacco Counseling Counseling given: Not Answered Comment: 1968   Clinical Intake:  Pre-visit preparation completed: Yes  Pain : No/denies pain     Nutritional Risks: None Diabetes: No  How often do you need to have someone help you when you read instructions, pamphlets, or other written materials from your doctor or pharmacy?: 1 - Never What is the last grade level you completed in school?: some college  Diabetic: No Nutrition Risk Assessment:  Has the patient had any N/V/D within the last 2 months?  No  Does the patient have any non-healing wounds?  No  Has the patient had any unintentional weight loss or weight gain?  No   Diabetes:  Is the patient diabetic?  No  If diabetic, was a CBG obtained today?  No  Did the patient bring in their glucometer from home?  No  How often do you monitor your CBG's? N/A.   Financial Strains and Diabetes Management:  Are you having any financial strains with the device, your supplies or your medication? N/A.  Does the patient want to be seen by Chronic Care Management for management of their diabetes?  N/A Would the patient like to be referred to a Nutritionist or for Diabetic Management?  N/A     Interpreter Needed?: No  Information entered by :: CJohnson, LPN   Activities of Daily Living In your present state of health, do you have any difficulty performing the following activities: 02/26/2020 05/01/2019  Hearing? N N  Vision? N N  Difficulty concentrating or making decisions? N N  Walking or climbing stairs? N N  Dressing or bathing? N N  Doing errands, shopping? N -  Preparing Food and eating ? N -  Using the Toilet? N -  In the past six months, have you  accidently leaked urine? N -  Do you have  problems with loss of bowel control? N -  Managing your Medications? N -  Managing your Finances? N -  Housekeeping or managing your Housekeeping? N -  Some recent data might be hidden    Patient Care Team: Pleas Koch, NP as PCP - General (Internal Medicine) Nahser, Wonda Cheng, MD as PCP - Cardiology (Cardiology) Luberta Mutter, MD as Consulting Physician (Ophthalmology) Latanya Maudlin, MD as Consulting Physician (Orthopedic Surgery) Pyrtle, Lajuan Lines, MD as Consulting Physician (Gastroenterology) Kathie Rhodes, MD as Consulting Physician (Urology) Danella Sensing, MD as Consulting Physician (Dermatology) Dohmeier, Asencion Partridge, MD as Consulting Physician (Neurology)  Indicate any recent Medical Services you may have received from other than [facility] providers in the past year (date may be approximate).     Assessment:   This is a routine wellness examination for [place].  Hearing/Vision screen  Hearing Screening   125Hz  250Hz  500Hz  1000Hz  2000Hz  3000Hz  4000Hz  6000Hz  8000Hz   Right ear:           Left ear:           Vision Screening Comments: Patient gets annual eye exams   Dietary issues and exercise activities discussed: Current Exercise Habits: The patient does not participate in regular exercise at present, Exercise limited by: None identified  Goals    . Patient Stated     Starting 02/21/2018, I will continue to take medications as prescribed.     . Patient Stated     02/26/2020, I will maintain and continue medications as prescribed.       Depression Screen PHQ 2/9 Scores 02/26/2020 03/01/2019 02/21/2018 02/14/2017 01/21/2016 09/21/2015  PHQ - 2 Score 0 0 0 0 0 0  PHQ- 9 Score 0 - 0 - - -    Fall Risk Fall Risk  02/26/2020 02/21/2018 02/14/2017 01/21/2016 09/21/2015  Falls in the past year? 0 No No No No  Number falls in past yr: 0 - - - -  Injury with Fall? 0 - - - -  Risk for fall due to : Medication side effect - - - -  Follow up  Falls evaluation completed;Falls prevention discussed - - - -    Any stairs in or around the home? Yes  If so, are there any without handrails? No  Home free of loose throw rugs in walkways, pet beds, electrical cords, etc? Yes  Adequate lighting in your home to reduce risk of falls? Yes   ASSISTIVE DEVICES UTILIZED TO PREVENT FALLS:  Life alert? No  Use of a cane, walker or w/c? No  Grab bars in the bathroom? No  Shower chair or bench in shower? No  Elevated toilet seat or a handicapped toilet? No   TIMED UP AND GO:  Was the test performed? N/A, telephonic visit .    Cognitive Function: MMSE - Mini Mental State Exam 02/26/2020 02/21/2018 02/14/2017  Orientation to time 5 5 5   Orientation to Place 5 5 5   Registration 3 3 3   Attention/ Calculation 5 0 0  Recall 3 3 2   Language- name 2 objects - 0 0  Language- repeat 1 1 1   Language- follow 3 step command - 3 3  Language- read & follow direction - 0 0  Write a sentence - 0 0  Copy design - 0 0  Total score - 20 19  Mini Cog  Mini-Cog screen was completed. Maximum score is 22. A value of 0 denotes this part of the MMSE was not completed or the patient failed  this part of the Mini-Cog screening.       Immunizations Immunization History  Administered Date(s) Administered  . PFIZER SARS-COV-2 Vaccination 08/15/2019, 09/05/2019  . Pneumococcal Conjugate-13 01/21/2016  . Pneumococcal Polysaccharide-23 02/06/2014  . Td 08/02/2011    TDAP status: Up to date Flu Vaccine status: due Fall 2021 Pneumococcal vaccine status: Up to date Covid-19 vaccine status: Completed vaccines  Qualifies for Shingles Vaccine? Yes   Zostavax completed No   Shingrix Completed?: No.    Education has been provided regarding the importance of this vaccine. Patient has been advised to call insurance company to determine out of pocket expense if they have not yet received this vaccine. Advised may also receive vaccine at local pharmacy or Health  Dept. Verbalized acceptance and understanding.  Screening Tests Health Maintenance  Topic Date Due  . INFLUENZA VACCINE  03/01/2020  . TETANUS/TDAP  08/01/2021  . COVID-19 Vaccine  Completed  . PNA vac Low Risk Adult  Completed    Health Maintenance  There are no preventive care reminders to display for this patient.  Colorectal cancer screening: Completed 02/15/2017. no longer required   Lung Cancer Screening: (Low Dose CT Chest recommended if Age 47-80 years, 30 pack-year currently smoking OR have quit w/in 15years.) does not qualify.     Additional Screening:  Hepatitis C Screening: does not qualify; Completed N/A  Vision Screening: Recommended annual ophthalmology exams for early detection of glaucoma and other disorders of the eye. Is the patient up to date with their annual eye exam?  Yes  Who is the provider or what is the name of the office in which the patient attends annual eye exams? Dr. Ellie Lunch If pt is not established with a provider, would they like to be referred to a provider to establish care? No .   Dental Screening: Recommended annual dental exams for proper oral hygiene  Community Resource Referral / Chronic Care Management: CRR required this visit?  No   CCM required this visit?  No      Plan:     I have personally reviewed and noted the following in the patient's chart:   . Medical and social history . Use of alcohol, tobacco or illicit drugs  . Current medications and supplements . Functional ability and status . Nutritional status . Physical activity . Advanced directives . List of other physicians . Hospitalizations, surgeries, and ER visits in previous 12 months . Vitals . Screenings to include cognitive, depression, and falls . Referrals and appointments  In addition, I have reviewed and discussed with patient certain preventive protocols, quality metrics, and best practice recommendations. A written personalized care plan for  preventive services as well as general preventive health recommendations were provided to patient.   Due to this being a telephonic visit, the after visit summary with patients personalized plan was offered to patient via mail or my-chart.  Patient preferred to pick up at office at next visit.   Andrez Grime, LPN   09/10/4707

## 2020-02-26 NOTE — Progress Notes (Signed)
PCP notes:  Health Maintenance: No gaps noted   Abnormal Screenings: none   Patient concerns: none   Nurse concerns: none   Next PCP appt.: 03/04/2020 @ 2:40 pm

## 2020-02-28 ENCOUNTER — Other Ambulatory Visit (INDEPENDENT_AMBULATORY_CARE_PROVIDER_SITE_OTHER): Payer: Medicare Other

## 2020-02-28 ENCOUNTER — Other Ambulatory Visit: Payer: Self-pay

## 2020-02-28 DIAGNOSIS — R7303 Prediabetes: Secondary | ICD-10-CM

## 2020-02-28 DIAGNOSIS — I10 Essential (primary) hypertension: Secondary | ICD-10-CM | POA: Diagnosis not present

## 2020-02-28 DIAGNOSIS — E7800 Pure hypercholesterolemia, unspecified: Secondary | ICD-10-CM | POA: Diagnosis not present

## 2020-02-28 LAB — LIPID PANEL
Cholesterol: 148 mg/dL (ref 0–200)
HDL: 47.7 mg/dL (ref >39.00)
LDL Cholesterol: 61 mg/dL (ref 0–99)
NonHDL: 100.28
Total CHOL/HDL Ratio: 3
Triglycerides: 197 mg/dL — ABNORMAL HIGH (ref 0.0–149.0)
VLDL: 39.4 mg/dL (ref 0.0–40.0)

## 2020-02-28 LAB — BASIC METABOLIC PANEL
BUN: 16 mg/dL (ref 6–23)
CO2: 29 mEq/L (ref 19–32)
Calcium: 8.8 mg/dL (ref 8.4–10.5)
Chloride: 104 mEq/L (ref 96–112)
Creatinine, Ser: 1.3 mg/dL (ref 0.40–1.50)
GFR: 52.65 mL/min — ABNORMAL LOW (ref >60.00)
Glucose, Bld: 133 mg/dL — ABNORMAL HIGH (ref 70–99)
Potassium: 4.7 mEq/L (ref 3.5–5.1)
Sodium: 138 mEq/L (ref 135–145)

## 2020-02-28 LAB — HEMOGLOBIN A1C: Hgb A1c MFr Bld: 6.4 % (ref 4.6–6.5)

## 2020-02-28 LAB — CBC
HCT: 39.2 % (ref 39.0–52.0)
Hemoglobin: 13.8 g/dL (ref 13.0–17.0)
MCHC: 35.3 g/dL (ref 30.0–36.0)
MCV: 103.4 fl — ABNORMAL HIGH (ref 78.0–100.0)
Platelets: 161 10*3/uL (ref 150.0–400.0)
RBC: 3.79 Mil/uL — ABNORMAL LOW (ref 4.22–5.81)
RDW: 13 % (ref 11.5–15.5)
WBC: 5.9 10*3/uL (ref 4.0–10.5)

## 2020-03-04 ENCOUNTER — Other Ambulatory Visit: Payer: Self-pay

## 2020-03-04 ENCOUNTER — Ambulatory Visit (INDEPENDENT_AMBULATORY_CARE_PROVIDER_SITE_OTHER): Payer: Medicare Other | Admitting: Primary Care

## 2020-03-04 ENCOUNTER — Encounter: Payer: Self-pay | Admitting: Primary Care

## 2020-03-04 DIAGNOSIS — R7303 Prediabetes: Secondary | ICD-10-CM | POA: Diagnosis not present

## 2020-03-04 DIAGNOSIS — G629 Polyneuropathy, unspecified: Secondary | ICD-10-CM

## 2020-03-04 DIAGNOSIS — I359 Nonrheumatic aortic valve disorder, unspecified: Secondary | ICD-10-CM | POA: Diagnosis not present

## 2020-03-04 DIAGNOSIS — I255 Ischemic cardiomyopathy: Secondary | ICD-10-CM

## 2020-03-04 DIAGNOSIS — E7800 Pure hypercholesterolemia, unspecified: Secondary | ICD-10-CM

## 2020-03-04 DIAGNOSIS — I2510 Atherosclerotic heart disease of native coronary artery without angina pectoris: Secondary | ICD-10-CM

## 2020-03-04 DIAGNOSIS — K219 Gastro-esophageal reflux disease without esophagitis: Secondary | ICD-10-CM

## 2020-03-04 DIAGNOSIS — G4700 Insomnia, unspecified: Secondary | ICD-10-CM | POA: Diagnosis not present

## 2020-03-04 DIAGNOSIS — N529 Male erectile dysfunction, unspecified: Secondary | ICD-10-CM | POA: Diagnosis not present

## 2020-03-04 DIAGNOSIS — G4733 Obstructive sleep apnea (adult) (pediatric): Secondary | ICD-10-CM

## 2020-03-04 DIAGNOSIS — I10 Essential (primary) hypertension: Secondary | ICD-10-CM

## 2020-03-04 DIAGNOSIS — Z9989 Dependence on other enabling machines and devices: Secondary | ICD-10-CM

## 2020-03-04 MED ORDER — SILDENAFIL CITRATE 20 MG PO TABS: 40.0000 mg | ORAL_TABLET | Freq: Every day | ORAL | 0 refills | Status: AC | PRN

## 2020-03-04 NOTE — Patient Instructions (Signed)
Be sure to get regular exercise.  It's important to improve your diet by reducing consumption of fast food, fried food, processed snack foods, sugary drinks. Increase consumption of fresh vegetables and fruits, whole grains, water.  Ensure you are drinking 64 ounces of water daily.  Schedule a lab appointment to return in 3 months for diabetes check.  It was a pleasure to see you today!   Prediabetes Prediabetes is the condition of having a blood sugar (blood glucose) level that is higher than it should be, but not high enough for you to be diagnosed with type 2 diabetes. Having prediabetes puts you at risk for developing type 2 diabetes (type 2 diabetes mellitus). Prediabetes may be called impaired glucose tolerance or impaired fasting glucose. Prediabetes usually does not cause symptoms. Your health care provider can diagnose this condition with blood tests. You may be tested for prediabetes if you are overweight and if you have at least one other risk factor for prediabetes. What is blood glucose, and how is it measured? Blood glucose refers to the amount of glucose in your bloodstream. Glucose comes from eating foods that contain sugars and starches (carbohydrates), which the body breaks down into glucose. Your blood glucose level may be measured in mg/dL (milligrams per deciliter) or mmol/L (millimoles per liter). Your blood glucose may be checked with one or more of the following blood tests:  A fasting blood glucose (FBG) test. You will not be allowed to eat (you will fast) for 8 hours or longer before a blood sample is taken. ? A normal range for FBG is 70-100 mg/dl (3.9-5.6 mmol/L).  An A1c (hemoglobin A1c) blood test. This test provides information about blood glucose control over the previous 2?46months.  An oral glucose tolerance test (OGTT). This test measures your blood glucose at two times: ? After fasting. This is your baseline level. ? Two hours after you drink a beverage  that contains glucose. You may be diagnosed with prediabetes:  If your FBG is 100?125 mg/dL (5.6-6.9 mmol/L).  If your A1c level is 5.7?6.4%.  If your OGTT result is 140?199 mg/dL (7.8-11 mmol/L). These blood tests may be repeated to confirm your diagnosis. How can this condition affect me? The pancreas produces a hormone (insulin) that helps to move glucose from the bloodstream into cells. When cells in the body do not respond properly to insulin that the body makes (insulin resistance), excess glucose builds up in the blood instead of going into cells. As a result, high blood glucose (hyperglycemia) can develop, which can cause many complications. Hyperglycemia is a symptom of prediabetes. Having high blood glucose for a long time is dangerous. Too much glucose in your blood can damage your nerves and blood vessels. Long-term damage can lead to complications from diabetes, which may include:  Heart disease.  Stroke.  Blindness.  Kidney disease.  Depression.  Poor circulation in the feet and legs, which could lead to surgical removal (amputation) in severe cases. What can increase my risk? Risk factors for prediabetes include:  Having a family member with type 2 diabetes.  Being overweight or obese.  Being older than age 34.  Being of American [place], African-American, Hispanic/Latino, or Asian/Pacific Islander descent.  Having an inactive (sedentary) lifestyle.  Having a history of heart disease.  History of gestational diabetes or polycystic ovary syndrome (PCOS), in women.  Having low levels of good cholesterol (HDL-C) or high levels of blood fats (triglycerides).  Having high blood pressure. What actions can I take  to prevent diabetes?      Be physically active. ? Do moderate-intensity physical activity for 30 or more minutes on 5 or more days of the week, or as much as told by your health care provider. This could be brisk walking, biking, or water  aerobics. ? Ask your health care provider what activities are safe for you. A mix of physical activities may be best, such as walking, swimming, cycling, and strength training.  Lose weight as told by your health care provider. ? Losing 5-7% of your body weight can reverse insulin resistance. ? Your health care provider can determine how much weight loss is best for you and can help you lose weight safely.  Follow a healthy meal plan. This includes eating lean proteins, complex carbohydrates, fresh fruits and vegetables, low-fat dairy products, and healthy fats. ? Follow instructions from your health care provider about eating or drinking restrictions. ? Make an appointment to see a diet and nutrition specialist (registered dietitian) to help you create a healthy eating plan that is right for you.  Do not smoke or use any tobacco products, such as cigarettes, chewing tobacco, and e-cigarettes. If you need help quitting, ask your health care provider.  Take over-the-counter and prescription medicines as told by your health care provider. You may be prescribed medicines that help lower the risk of type 2 diabetes.  Keep all follow-up visits as told by your health care provider. This is important. Summary  Prediabetes is the condition of having a blood sugar (blood glucose) level that is higher than it should be, but not high enough for you to be diagnosed with type 2 diabetes.  Having prediabetes puts you at risk for developing type 2 diabetes (type 2 diabetes mellitus).  To help prevent type 2 diabetes, make lifestyle changes such as being physically active and eating a healthy diet. Lose weight as told by your health care provider. This information is not intended to replace advice given to you by your health care provider. Make sure you discuss any questions you have with your health care provider. Document Revised: 11/09/2018 Document Reviewed: 09/08/2015 Elsevier Patient Education  [place].

## 2020-03-04 NOTE — Assessment & Plan Note (Signed)
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  Compliant to prescribed regimen.  No recent nitroglycerin use.  He will see cardiology soon. Last stress test and echo was in 2019.  Continue beta blocker, lipid lowering therapy, BP control.

## 2020-03-04 NOTE — Assessment & Plan Note (Addendum)
Doing well on PRN sildenafil. Rx provided today.

## 2020-03-04 NOTE — Assessment & Plan Note (Signed)
Following with neurology, overall doing well on gabapentin for which he's taking BID rather than TID. Continue same.

## 2020-03-04 NOTE — Assessment & Plan Note (Signed)
Well controlled based off of recent labs. Continue Zetia and atorvastatin.

## 2020-03-04 NOTE — Assessment & Plan Note (Signed)
Well controlled on Ambien for which he's been taking for years. No side effects. Continue same.

## 2020-03-04 NOTE — Assessment & Plan Note (Signed)
Compliant to CPAP nightly, continue same.  

## 2020-03-04 NOTE — Assessment & Plan Note (Signed)
Well controlled in the office today, continue amlodipine, lisinopril, metoprolol succinate. CMP reviewed.

## 2020-03-04 NOTE — Progress Notes (Signed)
Subjective:    Patient ID: Gabriel [name], male    DOB: 1935/02/26, 85 y.o.   MRN: [id number]  HPI  This visit occurred during the SARS-CoV-2 public health emergency.  Safety protocols were in place, including screening questions prior to the visit, additional usage of staff PPE, and extensive cleaning of exam room while observing appropriate contact time as indicated for disinfecting solutions.   Gabriel [name] is a 85 year old male who presents today for [place] Part 2. He spoke with our health advisor last week.  Immunizations: -Tetanus: Completed in 2013 -Influenza: Due this season  -Shingles: Never had chicken pox. -Pneumonia: Completed Prevnar in 2017 and Pneumovax in 2015 -Covid-19: Completed series   Colonoscopy: Completed in 2018  BP Readings from Last 3 Encounters:  03/04/20 120/70  09/25/19 118/68  08/15/19 (!) 136/57      Review of Systems  Constitutional: Negative for unexpected weight change.  HENT: Negative for rhinorrhea.   Respiratory: Negative for cough and shortness of breath.   Cardiovascular: Negative for chest pain.  Gastrointestinal: Negative for constipation and diarrhea.  Genitourinary: Negative for difficulty urinating.  Musculoskeletal: Negative for arthralgias and myalgias.  Skin: Negative for rash.  Allergic/Immunologic: Negative for environmental allergies.  Neurological: Positive for numbness. Negative for dizziness and headaches.       Chronic neuropathy   Psychiatric/Behavioral: Negative for sleep disturbance. The patient is not nervous/anxious.        Past Medical History:  Diagnosis Date   1st degree AV block    Aortic valve disease    a. Mild AS, AI by echo 01/2014.   Arthritis    CAD (coronary artery disease)    a. s/p PTCA 1995. b. MI in 2003 s/p stenting to LAD 2003, s/p stent to Cx 2003. c. prior rotablator to PTCA vessel. d. Abnl nuc 01/2014: s/p DES to LAD, mild-mod nonobstructive disease otherwise.   Carpal tunnel syndrome      Carpal tunnel syndrome    Carpal tunnel syndrome    right   GERD (gastroesophageal reflux disease)    History of kidney stones    Hyperlipidemia    a. Can only tolerate low dose statins.   Hypertension    Insomnia    Ischemic cardiomyopathy    a. EF 40-45% by echo 02/04/14, 40-45% by cath 02/05/14 (similar to 2010).   Kidney stone    Neuropathy    OSA (obstructive sleep apnea)    with upper airway resistancy   Pneumonia 2019   RBBB    Small intestinal bacterial overgrowth    Tubular adenoma of colon    Vertigo    Had Epley maneuver, vestibular rehab.     Social History   Socioeconomic History   Marital status: Married    Spouse name: [name]   Number of children: 6   Years of education: hs   Highest education level: Not on file  Occupational History    Employer: RETIRED  Tobacco Use   Smoking status: Former Smoker    Quit date: 1950    Years since quitting: 26.6   Smokeless tobacco: Never Used   Tobacco comment: 1968  Vaping Use   Vaping Use: Never used  Substance and Sexual Activity   Alcohol use: No    Alcohol/week: 0.0 standard drinks    Comment: rarely   Drug use: No   Sexual activity: Yes  Other Topics Concern   Not on file  Social History Narrative   Patient is married (  [name]).   Patient has four biological children and two adopted children.   Patient is retired from Electronics engineer.   Patient has a high school education.   Patient drinks coffee (12oz) daily.   Patient is right-handed.   Enjoys traveling, working on projects.             Social Determinants of Health   Financial Resource Strain: Low Risk    Difficulty of Paying Living Expenses: Not hard at all  Food Insecurity: No Food Insecurity   Worried About Charity fundraiser in the Last Year: Never true   [place] in the Last Year: Never true  Transportation Needs: No Transportation Needs   Lack of Transportation (Medical): No    Lack of Transportation (Non-Medical): No  Physical Activity: Inactive   Days of Exercise per Week: 0 days   Minutes of Exercise per Session: 0 min  Stress: No Stress Concern Present   Feeling of Stress : Not at all  Social Connections:    Frequency of Communication with Friends and Family:    Frequency of Social Gatherings with Friends and Family:    Attends Religious Services:    Active Member of Clubs or Organizations:    Attends Music therapist:    Marital Status:   Intimate Partner Violence: Not At Risk   Fear of Current or Ex-Partner: No   Emotionally Abused: No   Physically Abused: No   Sexually Abused: No    Past Surgical History:  Procedure Laterality Date   BELPHAROPTOSIS REPAIR Bilateral 05/30/2017   CARDIAC CATHETERIZATION  2003   Following MI   CARPAL TUNNEL RELEASE Left 2016   CATARACT EXTRACTION, BILATERAL  2004   CHOLECYSTECTOMY  2009   COLONOSCOPY W/ POLYPECTOMY     CORONARY ANGIOPLASTY  2003   LAD   CORONARY STENT PLACEMENT  2003   LCX   ESOPHAGOGASTRODUODENOSCOPY     HERNIA REPAIR Bilateral 1994   double   KIDNEY STONE SURGERY  10/2005   KNEE SURGERY Left    arthroscopy    LEFT HEART CATH  02/05/2014   LEFT HEART CATHETERIZATION WITH CORONARY ANGIOGRAM N/A 02/05/2014   Procedure: LEFT HEART CATHETERIZATION WITH CORONARY ANGIOGRAM;  Surgeon: Jettie Booze, MD;  Location: [facility] CATH LAB;  Service: Cardiovascular;  Laterality: N/A;   LITHOTRIPSY  10/2005   meniscus removal- knee Right    NASAL SEPTOPLASTY W/ TURBINOPLASTY Bilateral 05/01/2019   Procedure: NASAL SEPTOPLASTY WITH TURBINATE REDUCTION;  Surgeon: Jerrell Belfast, MD;  Location: [facility];  Service: ENT;  Laterality: Bilateral;   PERCUTANEOUS CORONARY STENT INTERVENTION (PCI-S) Right 02/05/2014   Procedure: PERCUTANEOUS CORONARY STENT INTERVENTION (PCI-S);  Surgeon: Jettie Booze, MD;  Location: [facility] CATH LAB;  Service: Cardiovascular;  Laterality:  Right;   SHOULDER OPEN ROTATOR CUFF REPAIR Right 1990    Family History  Problem Relation Age of Onset   Heart Problems Mother    Diabetes Mother    Parkinson's disease Father 81   Other Sister        Muscular degeneration   Heart attack Neg Hx    Stroke Neg Hx     Allergies  Allergen Reactions   Lipitor [Atorvastatin Calcium] Other (See Comments)    Muscle pain--Pt stated he can tolerate a low dose   Mevacor [Lovastatin] Other (See Comments)    "makes me feel loopy"    Current Outpatient Medications on File Prior to Visit  Medication Sig Dispense Refill  amLODipine (NORVASC) 5 MG tablet TAKE 1 TABLET BY MOUTH EVERY DAY 90 tablet 3   aspirin EC 81 MG tablet Take 81 mg by mouth daily.     atorvastatin (LIPITOR) 10 MG tablet Take 1 tablet (10 mg total) by mouth daily. 90 tablet 3   clopidogrel (PLAVIX) 75 MG tablet Take 1 tablet (75 mg total) by mouth daily. 90 tablet 2   Coenzyme Q10 (CO Q 10 PO) Take 200 mg by mouth daily.      ezetimibe (ZETIA) 10 MG tablet TAKE 1 TABLET(10 MG) BY MOUTH DAILY 90 tablet 1   gabapentin (NEURONTIN) 800 MG tablet TAKE 1 TABLET(800 MG) BY MOUTH THREE TIMES DAILY 270 tablet 1   lisinopril (ZESTRIL) 20 MG tablet Take 1 tablet (20 mg total) by mouth daily. 90 tablet 1   Magnesium 500 MG TABS Take 500 mg by mouth daily.     metoprolol succinate (TOPROL-XL) 25 MG 24 hr tablet TAKE 1 TABLET BY MOUTH EVERY DAY 90 tablet 3   Multiple Vitamins-Minerals (MULTIVITAMINS THER. W/MINERALS) TABS Take 1 tablet by mouth daily.     nitroGLYCERIN (NITROSTAT) 0.4 MG SL tablet Place 1 tablet (0.4 mg total) under the tongue every 5 (five) minutes as needed for chest pain. 30 tablet 1   Omega-3 Fatty Acids (OMEGA 3 PO) Take 750 mg by mouth daily.      Wheat Dextrin (BENEFIBER PO) Take 5 g by mouth daily.     zolpidem (AMBIEN) 10 MG tablet TAKE 1 TABLET(10 MG) BY MOUTH AT BEDTIME AS NEEDED FOR SLEEP 90 tablet 0   No current facility-administered  medications on file prior to visit.    BP 120/70    Pulse 64    Temp (!) 96.9 F (36.1 C) (Temporal)    Ht 6' 0.5" (1.842 m)    Wt 213 lb 12 oz (97 kg)    SpO2 98%    BMI 28.59 kg/m    Objective:   Physical Exam HENT:     Right Ear: Tympanic membrane and ear canal normal.     Left Ear: Tympanic membrane and ear canal normal.  Eyes:     Pupils: Pupils are equal, round, and reactive to light.  Cardiovascular:     Rate and Rhythm: Normal rate and regular rhythm.     Heart sounds: Murmur heard.   Pulmonary:     Effort: Pulmonary effort is normal.     Breath sounds: Normal breath sounds.  Abdominal:     General: Bowel sounds are normal.     Palpations: Abdomen is soft.     Tenderness: There is no abdominal tenderness.  Musculoskeletal:        General: Normal range of motion.     Cervical back: Neck supple.  Skin:    General: Skin is warm and dry.  Neurological:     Mental Status: He is alert and oriented to person, place, and time.     Cranial Nerves: No cranial nerve deficit.     Deep Tendon Reflexes:     Reflex Scores:      Patellar reflexes are 2+ on the right side and 2+ on the left side.           Assessment & Plan:

## 2020-03-04 NOTE — Assessment & Plan Note (Signed)
No concerns expressed for GERD, continue to monitor.

## 2020-03-04 NOTE — Assessment & Plan Note (Signed)
Murmur noted today. Echocardiogram reviewed from 2019.

## 2020-03-04 NOTE — Assessment & Plan Note (Signed)
Recent A1C very close to diabetes diagnosis, discussed this with him today. He admits to a poor diet, but is active during the day.  Encouraged to work on diet. Repeat A1C in 3 months.

## 2020-03-16 ENCOUNTER — Other Ambulatory Visit: Payer: Self-pay

## 2020-03-16 MED ORDER — AMLODIPINE BESYLATE 5 MG PO TABS: 5.0000 mg | ORAL_TABLET | Freq: Every day | ORAL | 1 refills | Status: AC

## 2020-03-16 MED ORDER — METOPROLOL SUCCINATE ER 25 MG PO TB24: 25.0000 mg | ORAL_TABLET | Freq: Every day | ORAL | 1 refills | Status: AC

## 2020-03-23 ENCOUNTER — Ambulatory Visit: Payer: Medicare Other | Admitting: Cardiovascular Disease

## 2020-04-08 ENCOUNTER — Inpatient Hospital Stay (HOSPITAL_COMMUNITY)
Admission: EM | Admit: 2020-04-08 | Discharge: 2020-05-01 | DRG: 951 | Disposition: E | Payer: Medicare Other | Attending: Internal Medicine | Admitting: Internal Medicine

## 2020-04-08 ENCOUNTER — Emergency Department (HOSPITAL_COMMUNITY): Payer: Medicare Other

## 2020-04-08 ENCOUNTER — Other Ambulatory Visit: Payer: Self-pay

## 2020-04-08 ENCOUNTER — Encounter: Payer: Self-pay | Admitting: Cardiovascular Disease

## 2020-04-08 ENCOUNTER — Encounter (HOSPITAL_COMMUNITY): Payer: Self-pay | Admitting: Emergency Medicine

## 2020-04-08 DIAGNOSIS — Z20822 Contact with and (suspected) exposure to covid-19: Secondary | ICD-10-CM | POA: Diagnosis not present

## 2020-04-08 DIAGNOSIS — I619 Nontraumatic intracerebral hemorrhage, unspecified: Secondary | ICD-10-CM

## 2020-04-08 DIAGNOSIS — E785 Hyperlipidemia, unspecified: Secondary | ICD-10-CM | POA: Diagnosis present

## 2020-04-08 DIAGNOSIS — I639 Cerebral infarction, unspecified: Secondary | ICD-10-CM | POA: Diagnosis not present

## 2020-04-08 DIAGNOSIS — Z6830 Body mass index (BMI) 30.0-30.9, adult: Secondary | ICD-10-CM

## 2020-04-08 DIAGNOSIS — R410 Disorientation, unspecified: Secondary | ICD-10-CM | POA: Diagnosis not present

## 2020-04-08 DIAGNOSIS — I110 Hypertensive heart disease with heart failure: Secondary | ICD-10-CM | POA: Diagnosis present

## 2020-04-08 DIAGNOSIS — Z66 Do not resuscitate: Secondary | ICD-10-CM | POA: Diagnosis present

## 2020-04-08 DIAGNOSIS — Z515 Encounter for palliative care: Principal | ICD-10-CM | POA: Diagnosis present

## 2020-04-08 DIAGNOSIS — S06360A Traumatic hemorrhage of cerebrum, unspecified, without loss of consciousness, initial encounter: Secondary | ICD-10-CM | POA: Diagnosis not present

## 2020-04-08 DIAGNOSIS — I4510 Unspecified right bundle-branch block: Secondary | ICD-10-CM | POA: Diagnosis not present

## 2020-04-08 DIAGNOSIS — R9431 Abnormal electrocardiogram [ECG] [EKG]: Secondary | ICD-10-CM | POA: Diagnosis not present

## 2020-04-08 DIAGNOSIS — G936 Cerebral edema: Secondary | ICD-10-CM | POA: Diagnosis not present

## 2020-04-08 DIAGNOSIS — E669 Obesity, unspecified: Secondary | ICD-10-CM | POA: Diagnosis present

## 2020-04-08 DIAGNOSIS — R29810 Facial weakness: Secondary | ICD-10-CM | POA: Diagnosis present

## 2020-04-08 DIAGNOSIS — I5032 Chronic diastolic (congestive) heart failure: Secondary | ICD-10-CM | POA: Diagnosis not present

## 2020-04-08 DIAGNOSIS — R4182 Altered mental status, unspecified: Secondary | ICD-10-CM | POA: Diagnosis not present

## 2020-04-08 DIAGNOSIS — R4701 Aphasia: Secondary | ICD-10-CM | POA: Diagnosis present

## 2020-04-08 DIAGNOSIS — R29725 NIHSS score 25: Secondary | ICD-10-CM | POA: Diagnosis present

## 2020-04-08 DIAGNOSIS — I618 Other nontraumatic intracerebral hemorrhage: Secondary | ICD-10-CM | POA: Diagnosis not present

## 2020-04-08 DIAGNOSIS — R404 Transient alteration of awareness: Secondary | ICD-10-CM | POA: Diagnosis not present

## 2020-04-08 DIAGNOSIS — Z955 Presence of coronary angioplasty implant and graft: Secondary | ICD-10-CM

## 2020-04-08 DIAGNOSIS — I2510 Atherosclerotic heart disease of native coronary artery without angina pectoris: Secondary | ICD-10-CM | POA: Diagnosis present

## 2020-04-08 DIAGNOSIS — R4781 Slurred speech: Secondary | ICD-10-CM | POA: Diagnosis not present

## 2020-04-08 LAB — CBC
HCT: 41 % (ref 39.0–52.0)
Hemoglobin: 14.2 g/dL (ref 13.0–17.0)
MCH: 35.1 pg — ABNORMAL HIGH (ref 26.0–34.0)
MCHC: 34.6 g/dL (ref 30.0–36.0)
MCV: 101.5 fL — ABNORMAL HIGH (ref 80.0–100.0)
Platelets: 178 10*3/uL (ref 150–400)
RBC: 4.04 MIL/uL — ABNORMAL LOW (ref 4.22–5.81)
RDW: 12.5 % (ref 11.5–15.5)
WBC: 7.5 10*3/uL (ref 4.0–10.5)
nRBC: 0 % (ref 0.0–0.2)

## 2020-04-08 LAB — APTT: aPTT: 25 seconds (ref 24–36)

## 2020-04-08 LAB — I-STAT CHEM 8, ED
BUN: 23 mg/dL (ref 8–23)
Calcium, Ion: 1.09 mmol/L — ABNORMAL LOW (ref 1.15–1.40)
Chloride: 105 mmol/L (ref 98–111)
Creatinine, Ser: 1.3 mg/dL — ABNORMAL HIGH (ref 0.61–1.24)
Glucose, Bld: 172 mg/dL — ABNORMAL HIGH (ref 70–99)
HCT: 40 % (ref 39.0–52.0)
Hemoglobin: 13.6 g/dL (ref 13.0–17.0)
Potassium: 4 mmol/L (ref 3.5–5.1)
Sodium: 139 mmol/L (ref 135–145)
TCO2: 22 mmol/L (ref 22–32)

## 2020-04-08 LAB — COMPREHENSIVE METABOLIC PANEL
ALT: 29 U/L (ref 0–44)
AST: 34 U/L (ref 15–41)
Albumin: 4.1 g/dL (ref 3.5–5.0)
Alkaline Phosphatase: 48 U/L (ref 38–126)
Anion gap: 12 (ref 5–15)
BUN: 21 mg/dL (ref 8–23)
CO2: 22 mmol/L (ref 22–32)
Calcium: 9.1 mg/dL (ref 8.9–10.3)
Chloride: 104 mmol/L (ref 98–111)
Creatinine, Ser: 1.26 mg/dL — ABNORMAL HIGH (ref 0.61–1.24)
GFR calc Af Amer: >60 mL/min (ref >60)
GFR calc non Af Amer: 52 mL/min — ABNORMAL LOW (ref >60)
Glucose, Bld: 176 mg/dL — ABNORMAL HIGH (ref 70–99)
Potassium: 4 mmol/L (ref 3.5–5.1)
Sodium: 138 mmol/L (ref 135–145)
Total Bilirubin: 0.6 mg/dL (ref 0.3–1.2)
Total Protein: 7.2 g/dL (ref 6.5–8.1)

## 2020-04-08 LAB — PROTIME-INR
INR: 1.1 (ref 0.8–1.2)
Prothrombin Time: 13.5 seconds (ref 11.4–15.2)

## 2020-04-08 LAB — DIFFERENTIAL
Abs Immature Granulocytes: 0.06 10*3/uL (ref 0.00–0.07)
Basophils Absolute: 0.1 10*3/uL (ref 0.0–0.1)
Basophils Relative: 1 %
Eosinophils Absolute: 0.3 10*3/uL (ref 0.0–0.5)
Eosinophils Relative: 3 %
Immature Granulocytes: 1 %
Lymphocytes Relative: 27 %
Lymphs Abs: 2 10*3/uL (ref 0.7–4.0)
Monocytes Absolute: 1 10*3/uL (ref 0.1–1.0)
Monocytes Relative: 13 %
Neutro Abs: 4.1 10*3/uL (ref 1.7–7.7)
Neutrophils Relative %: 55 %

## 2020-04-08 LAB — ETHANOL: Alcohol, Ethyl (B): <10 mg/dL (ref <10)

## 2020-04-08 LAB — CBG MONITORING, ED: Glucose-Capillary: 168 mg/dL — ABNORMAL HIGH (ref 70–99)

## 2020-04-08 MED ORDER — ONDANSETRON HCL 4 MG/2ML IJ SOLN
4.0000 mg | Freq: Four times a day (QID) | INTRAMUSCULAR | Status: DC | PRN
  Administered 2020-04-10: 4 mg via INTRAVENOUS
  Filled 2020-04-08: qty 2

## 2020-04-08 MED ORDER — LORAZEPAM 2 MG/ML PO CONC: 1.0000 mg | ORAL | Status: DC | PRN

## 2020-04-08 MED ORDER — HYDROMORPHONE HCL 1 MG/ML IJ SOLN
0.5000 mg | INTRAMUSCULAR | Status: DC | PRN
  Administered 2020-04-09 – 2020-04-10 (×4): 0.5 mg via INTRAVENOUS
  Filled 2020-04-08 (×2): qty 0.5
  Filled 2020-04-08 (×2): qty 1

## 2020-04-08 MED ORDER — ACETAMINOPHEN 325 MG PO TABS
650.0000 mg | ORAL_TABLET | Freq: Four times a day (QID) | ORAL | Status: DC | PRN
  Administered 2020-04-10: 650 mg via ORAL

## 2020-04-08 MED ORDER — LORAZEPAM 1 MG PO TABS: 1.0000 mg | ORAL_TABLET | ORAL | Status: DC | PRN

## 2020-04-08 MED ORDER — HALOPERIDOL 0.5 MG PO TABS: 0.5000 mg | ORAL_TABLET | ORAL | Status: DC | PRN

## 2020-04-08 MED ORDER — HALOPERIDOL LACTATE 2 MG/ML PO CONC
0.5000 mg | ORAL | Status: DC | PRN
  Filled 2020-04-08: qty 0.3

## 2020-04-08 MED ORDER — LORAZEPAM 2 MG/ML IJ SOLN
1.0000 mg | INTRAMUSCULAR | Status: DC | PRN
  Administered 2020-04-09 (×2): 1 mg via INTRAVENOUS
  Filled 2020-04-08 (×2): qty 1

## 2020-04-08 MED ORDER — ACETAMINOPHEN 650 MG RE SUPP
650.0000 mg | Freq: Four times a day (QID) | RECTAL | Status: DC | PRN
  Administered 2020-04-10 (×2): 650 mg via RECTAL
  Filled 2020-04-08 (×3): qty 1

## 2020-04-08 MED ORDER — GLYCOPYRROLATE 0.2 MG/ML IJ SOLN
0.2000 mg | INTRAMUSCULAR | Status: DC | PRN
  Administered 2020-04-09 – 2020-04-10 (×4): 0.2 mg via INTRAVENOUS
  Filled 2020-04-08 (×5): qty 1

## 2020-04-08 MED ORDER — CLEVIDIPINE BUTYRATE 0.5 MG/ML IV EMUL: 0.0000 mg/h | INTRAVENOUS | Status: DC

## 2020-04-08 MED ORDER — MORPHINE SULFATE (PF) 2 MG/ML IV SOLN
1.0000 mg | INTRAVENOUS | Status: DC | PRN
  Administered 2020-04-10 (×2): 1 mg via INTRAVENOUS
  Filled 2020-04-08 (×2): qty 1

## 2020-04-08 MED ORDER — SODIUM CHLORIDE 0.9% FLUSH
3.0000 mL | Freq: Two times a day (BID) | INTRAVENOUS | Status: DC
  Administered 2020-04-09 – 2020-04-10 (×3): 3 mL via INTRAVENOUS

## 2020-04-08 MED ORDER — GLYCOPYRROLATE 1 MG PO TABS
1.0000 mg | ORAL_TABLET | ORAL | Status: DC | PRN
  Filled 2020-04-08: qty 1

## 2020-04-08 MED ORDER — ONDANSETRON 4 MG PO TBDP: 4.0000 mg | ORAL_TABLET | Freq: Four times a day (QID) | ORAL | Status: DC | PRN

## 2020-04-08 MED ORDER — SODIUM CHLORIDE 0.9 % IV SOLN: 250.0000 mL | INTRAVENOUS | Status: DC | PRN

## 2020-04-08 MED ORDER — GLYCOPYRROLATE 0.2 MG/ML IJ SOLN: 0.2000 mg | INTRAMUSCULAR | Status: DC | PRN

## 2020-04-08 MED ORDER — HALOPERIDOL LACTATE 5 MG/ML IJ SOLN: 0.5000 mg | INTRAMUSCULAR | Status: DC | PRN

## 2020-04-08 MED ORDER — BIOTENE DRY MOUTH MT LIQD: 15.0000 mL | OROMUCOSAL | Status: DC | PRN

## 2020-04-08 MED ORDER — SODIUM CHLORIDE 0.9% FLUSH: 3.0000 mL | INTRAVENOUS | Status: DC | PRN

## 2020-04-08 NOTE — H&P (Addendum)
History and Physical  Gabriel [name] YHC:[id number] DOB: 1935/07/01 DOA: 04/02/2020  Referring physician: Dr. Erenest Blank PCP: Alma Friendly, MD  Outpatient Specialists: Cardiology, Dr. Acie Fredrickson. Patient coming from: Home  Chief Complaint: Unresponsiveness.  HPI: Gabriel [name] is a 85 y.o. male with medical history significant for coronary artery disease status post PCI with stent x4, chronic diastolic CHF, essential hypertension, hyperlipidemia, who presented to [facility] ED via EMS from home due to severe left eye pain, unresponsive on arrival.  History is obtained from his wife at bedside, daughter, and EDP.  Patient lives with his wife, was watching television when all of a sudden had an acute onset of severe left eye pain for which EMS was called.  Patient had been in his usual state of health prior to this.  Has a routine follow-up appointment with his cardiologist Dr. Acie Fredrickson planned for tomorrow 04/09/2020.  Patient was brought to the hospital for further evaluation and management.  In the ED, CT head showed large hemorrhagic stroke with left to right shift measuring up to 9 mm.  His spouse made decision for comfort care only.  TRH was asked to admit.  ED Course: Unresponsive, hypotensive, bradycardic and tachypneic.  CT head code stroke without contrast revealed large intraparenchymal hemorrhage centered at the left frontal temporal region measuring 8.7 x 5.9 x 3.7 cm, surrounding vasogenic edema with regional mass-effect throughout the adjacent left cerebral hemisphere, associated left to right shift measures up to 9 mm.  Review of Systems: Review of systems as noted in the HPI. All other systems reviewed and are negative.  Past medical history: Coronary artery disease status post PCI with stent x4 Essential hypertension Hyperlipidemia Chronic diastolic CHF  Past surgical history: PCI status post stent placement x4.   Social History:  reports that he has never smoked. He has never used  smokeless tobacco. He reports that he does not drink alcohol and does not use drugs.   Not on File  Family history: Father with history of Parkinson disease Mother with history of coronary artery disease status post CABG.  Prior to Admission medications   Not on File    Physical Exam: BP (!) 167/76 (BP Location: Right Arm)   Pulse 73   Temp (!) 97.2 F (36.2 C) (Axillary)   Resp (!) 26   Ht 5\' 10"  (1.778 m)   Wt 97.5 kg   SpO2 92%   BMI 30.84 kg/m   . General: 85 y.o. year-old male well developed well nourished in no acute distress.  Unresponsive.   . Cardiovascular: Regular rate and rhythm with no rubs or gallops.  No thyromegaly or JVD noted.  No lower extremity edema. 2/4 pulses in all 4 extremities. Marland Kitchen Respiratory: Clear to auscultation with no wheezes or rales.  . Abdomen: Soft nontender nondistended with normal bowel sounds x4 quadrants. . Muskuloskeletal: No cyanosis, clubbing or edema noted bilaterally . Neuro: Does not follow commands, unresponsive. . Skin: No ulcerative lesions noted or rashes . Psychiatry: Unable to assess due to unresponsiveness.         Labs on Admission:  Basic Metabolic Panel: Recent Labs  Lab 04/18/2020 2149 04/22/2020 2151  NA 138 139  K 4.0 4.0  CL 104 105  CO2 22  --   GLUCOSE 176* 172*  BUN 21 23  CREATININE 1.26* 1.30*  CALCIUM 9.1  --    Liver Function Tests: Recent Labs  Lab 04/28/2020 2149  AST 34  ALT 29  ALKPHOS 48  BILITOT 0.6  PROT 7.2  ALBUMIN 4.1   No results for input(s): LIPASE, AMYLASE in the last 168 hours. No results for input(s): AMMONIA in the last 168 hours. CBC: Recent Labs  Lab 04/06/2020 2149 04/12/2020 2151  WBC 7.5  --   NEUTROABS 4.1  --   HGB 14.2 13.6  HCT 41.0 40.0  MCV 101.5*  --   PLT 178  --    Cardiac Enzymes: No results for input(s): CKTOTAL, CKMB, CKMBINDEX, TROPONINI in the last 168 hours.  BNP (last 3 results) No results for input(s): BNP in the last 8760 hours.  ProBNP (last  3 results) No results for input(s): PROBNP in the last 8760 hours.  CBG: Recent Labs  Lab 04/14/2020 2145  GLUCAP 168*    Radiological Exams on Admission: CT HEAD CODE STROKE WO CONTRAST  Result Date: 04/05/2020 CLINICAL DATA:  Code stroke. Initial evaluation for acute altered mental status, unresponsive. EXAM: CT HEAD WITHOUT CONTRAST TECHNIQUE: Contiguous axial images were obtained from the base of the skull through the vertex without intravenous contrast. COMPARISON:  None. FINDINGS: Brain: Large intraparenchymal hemorrhage centered at the left frontotemporal region measures 8.7 x 5.9 x 3.7 cm (AP by craniocaudad by transverse) (estimated volume 95 cc). Surrounding vasogenic edema with regional mass effect throughout the adjacent left cerebral hemisphere. Left lateral ventricle partially effaced associated left-to-right shift measures up to 9 mm. No overt hydrocephalus or ventricular trapping at this time. Basilar cisterns are crowded but remain patent. Question trace intraventricular extension with subtle hyperdensity within the left lateral ventricle. No other acute intracranial hemorrhage. No other acute large vessel territory infarct. No appreciable mass lesion. No extra-axial fluid collection. Underlying atrophy with chronic small vessel ischemic disease noted. Vascular: No hyperdense vessel. Scattered vascular calcifications noted within the carotid siphons. Skull: Scalp soft tissues within normal limits.  Calvarium intact. Sinuses/Orbits: Globes and orbital soft tissues within normal limits. Scattered mucosal thickening noted within the ethmoidal air cells and maxillary sinuses. Paranasal sinuses are otherwise clear. No mastoid effusion. Other: None. IMPRESSION: 1. 8.7 x 5.9 x 3.7 cm intraparenchymal hemorrhage centered at the left frontotemporal region, (estimated volume 95 cc). Surrounding vasogenic edema with regional mass effect throughout the adjacent left cerebral hemisphere, with  associated 9 mm of left-to-right shift. No hydrocephalus or ventricular trapping at this time. 2. Question trace intraventricular extension with subtle hyperdensity within the left lateral ventricle. 3. Underlying atrophy with chronic microvascular ischemic disease. These results were communicated to Dr. Curly Shores at 10:10 pmon 09/07/2021by text page via the [facility] messaging system. Electronically Signed   By: Jeannine Boga M.D.   On: 04/08/2020 22:11    EKG: I independently viewed the EKG done and my findings are as followed: None obtained at the time of this visit.  Assessment/Plan Present on Admission: . Acute CVA (cerebrovascular accident) [facility])  Active Problems:   Acute CVA (cerebrovascular accident) ([place])  Acute large hemorrhagic CVA Presented unresponsive, CT head code stroke without contrast revealed large intraparenchymal hemorrhage centered at the left frontal temporal region measuring 8.7 x 5.9 x 3.7 cm, surrounding vasogenic edema with regional mass-effect throughout the adjacent left cerebral hemisphere, associated left to right shift measures up to 9 mm. Family made decision for comfort care only. Palliative care consult for hospice care. TOC consult to assist with hospice care Comfort care orders.  Coronary artery disease status post PCI on ASA and Plavix Chronic diastolic CHF Essential hypertension Hyperlipidemia Hold off home medications All focus on comfort measures Continue comfort care orders  DVT prophylaxis: None, comfort care only.  Code Status: DNR, comfort care only.  Family Communication: Discussed with patient spouse and daughter.  They have made decision for comfort care only.  Disposition Plan: Admit to [facility].  Consults called: Palliative care team, TOC.  Admission status: Observation status.   Status is: Observation    Dispo:  Patient From: Home  Planned Disposition: Home Hospice  Expected discharge date: 04/09/20  Medically stable  for discharge: No   Kayleen Memos MD [facility] Pager [phone]  If 7PM-7AM, please contact night-coverage [email or web address] Password [email or web address]  04/23/2020, 10:40 PM

## 2020-04-08 NOTE — ED Notes (Addendum)
Returned to room from CT scan , EDP and neurologist advised RN that patient will be on comfort care/pallative care per family's decision . Code stroke discontinued by neurologist .

## 2020-04-08 NOTE — ED Provider Notes (Signed)
[facility] EMERGENCY DEPARTMENT Provider Note   CSN: [id number] Arrival date & time: 04/23/2020  2143  An emergency department physician performed an initial assessment on this suspected stroke patient at 2146.  History Chief Complaint  Patient presents with  . Code Stroke    Hemorrhagic Stroke    Gabriel [name] is a 85 y.o. male.    HPI     History reviewed. No pertinent past medical history.  Patient Active Problem List   Diagnosis Date Noted  . Acute CVA (cerebrovascular accident) ([place]) 04/18/2020    History reviewed. No pertinent surgical history.     No family history on file.  Social History   Tobacco Use  . Smoking status: Never Smoker  . Smokeless tobacco: Never Used  Substance Use Topics  . Alcohol use: Never  . Drug use: Never    Home Medications Prior to Admission medications   Not on File    Allergies    Patient has no allergy information on record.  Review of Systems   Review of Systems  Unable to perform ROS: Mental status change    Physical Exam Updated Vital Signs BP (!) 180/69   Pulse 73   Temp (!) 97.2 F (36.2 C) (Axillary)   Resp (!) 26   Ht 5\' 10"  (1.778 m)   Wt 97.5 kg   SpO2 92%   BMI 30.84 kg/m   Physical Exam Vitals and nursing note reviewed. Exam conducted with a chaperone present.  Constitutional:      Appearance: He is well-developed.     Comments: Unresponsive  HENT:     Head: Normocephalic and atraumatic.  Eyes:     Conjunctiva/sclera: Conjunctivae normal.  Cardiovascular:     Rate and Rhythm: Normal rate and regular rhythm.     Heart sounds: No murmur heard.   Pulmonary:     Effort: Pulmonary effort is normal. No respiratory distress.     Breath sounds: Normal breath sounds.  Abdominal:     Palpations: Abdomen is soft.     Tenderness: There is no abdominal tenderness.  Musculoskeletal:        General: No deformity or signs of injury.     Cervical back: Neck supple.  Skin:     General: Skin is warm and dry.  Neurological:     Comments: Unresponsive, lying in stretcher, no spontaneous movement of arms or legs bilaterally, no motor response to pain, makes incomprehensible sounds, does not open eyes to pain     ED Results / Procedures / Treatments   Labs (all labs ordered are listed, but only abnormal results are displayed) Labs Reviewed  CBC - Abnormal; Notable for the following components:      Result Value   RBC 4.04 (*)    MCV 101.5 (*)    MCH 35.1 (*)    All other components within normal limits  COMPREHENSIVE METABOLIC PANEL - Abnormal; Notable for the following components:   Glucose, Bld 176 (*)    Creatinine, Ser 1.26 (*)    GFR calc non Af Amer 52 (*)    All other components within normal limits  CBG MONITORING, ED - Abnormal; Notable for the following components:   Glucose-Capillary 168 (*)    All other components within normal limits  I-STAT CHEM 8, ED - Abnormal; Notable for the following components:   Creatinine, Ser 1.30 (*)    Glucose, Bld 172 (*)    Calcium, Ion 1.09 (*)    All  other components within normal limits  SARS CORONAVIRUS 2 BY RT PCR (HOSPITAL ORDER, [facility])  ETHANOL  PROTIME-INR  APTT  DIFFERENTIAL    EKG None  Radiology CT HEAD CODE STROKE WO CONTRAST  Result Date: 04/07/2020 CLINICAL DATA:  Code stroke. Initial evaluation for acute altered mental status, unresponsive. EXAM: CT HEAD WITHOUT CONTRAST TECHNIQUE: Contiguous axial images were obtained from the base of the skull through the vertex without intravenous contrast. COMPARISON:  None. FINDINGS: Brain: Large intraparenchymal hemorrhage centered at the left frontotemporal region measures 8.7 x 5.9 x 3.7 cm (AP by craniocaudad by transverse) (estimated volume 95 cc). Surrounding vasogenic edema with regional mass effect throughout the adjacent left cerebral hemisphere. Left lateral ventricle partially effaced associated left-to-right shift  measures up to 9 mm. No overt hydrocephalus or ventricular trapping at this time. Basilar cisterns are crowded but remain patent. Question trace intraventricular extension with subtle hyperdensity within the left lateral ventricle. No other acute intracranial hemorrhage. No other acute large vessel territory infarct. No appreciable mass lesion. No extra-axial fluid collection. Underlying atrophy with chronic small vessel ischemic disease noted. Vascular: No hyperdense vessel. Scattered vascular calcifications noted within the carotid siphons. Skull: Scalp soft tissues within normal limits.  Calvarium intact. Sinuses/Orbits: Globes and orbital soft tissues within normal limits. Scattered mucosal thickening noted within the ethmoidal air cells and maxillary sinuses. Paranasal sinuses are otherwise clear. No mastoid effusion. Other: None. IMPRESSION: 1. 8.7 x 5.9 x 3.7 cm intraparenchymal hemorrhage centered at the left frontotemporal region, (estimated volume 95 cc). Surrounding vasogenic edema with regional mass effect throughout the adjacent left cerebral hemisphere, with associated 9 mm of left-to-right shift. No hydrocephalus or ventricular trapping at this time. 2. Question trace intraventricular extension with subtle hyperdensity within the left lateral ventricle. 3. Underlying atrophy with chronic microvascular ischemic disease. These results were communicated to Dr. Curly Shores at 10:10 pmon 09/30/2021by text page via the [facility] messaging system. Electronically Signed   By: Jeannine Boga M.D.   On: 04/08/2020 22:11    Procedures .Critical Care Performed by: Lucrezia Starch, MD Authorized by: Lucrezia Starch, MD   Critical care provider statement:    Critical care time (minutes):  45   Critical care was necessary to treat or prevent imminent or life-threatening deterioration of the following conditions:  CNS failure or compromise   Critical care was time spent personally by me on the following  activities:  Discussions with consultants, evaluation of patient's response to treatment, examination of patient, ordering and performing treatments and interventions, ordering and review of laboratory studies, ordering and review of radiographic studies, pulse oximetry, re-evaluation of patient's condition, obtaining history from patient or surrogate and review of old charts   (including critical care time)  Medications Ordered in ED Medications  acetaminophen (TYLENOL) tablet 650 mg (has no administration in time range)    Or  acetaminophen (TYLENOL) suppository 650 mg (has no administration in time range)  haloperidol (HALDOL) tablet 0.5 mg (has no administration in time range)    Or  haloperidol (HALDOL) 2 MG/ML solution 0.5 mg (has no administration in time range)    Or  haloperidol lactate (HALDOL) injection 0.5 mg (has no administration in time range)  ondansetron (ZOFRAN-ODT) disintegrating tablet 4 mg (has no administration in time range)    Or  ondansetron (ZOFRAN) injection 4 mg (has no administration in time range)  glycopyrrolate (ROBINUL) tablet 1 mg (has no administration in time range)    Or  glycopyrrolate (ROBINUL) injection 0.2 mg (has no administration in time range)    Or  glycopyrrolate (ROBINUL) injection 0.2 mg (has no administration in time range)  antiseptic oral rinse (BIOTENE) solution 15 mL (has no administration in time range)  HYDROmorphone (DILAUDID) injection 0.5 mg (has no administration in time range)  LORazepam (ATIVAN) tablet 1 mg (has no administration in time range)    Or  LORazepam (ATIVAN) 2 MG/ML concentrated solution 1 mg (has no administration in time range)    Or  LORazepam (ATIVAN) injection 1 mg (has no administration in time range)  sodium chloride flush (NS) 0.9 % injection 3 mL (3 mLs Intravenous Not Given 04/22/2020 2301)  sodium chloride flush (NS) 0.9 % injection 3 mL (has no administration in time range)  0.9 %  sodium chloride infusion  (has no administration in time range)  morphine 2 MG/ML injection 1 mg (has no administration in time range)    ED Course  I have reviewed the triage vital signs and the nursing notes.  Pertinent labs & imaging results that were available during my care of the patient were reviewed by me and considered in my medical decision making (see chart for details).  Clinical Course as of Apr 09 2339  Wed Apr 08, 2020  2208 Comfort measures only per wife   [RD]    Clinical Course User Index [RD] Lucrezia Starch, MD   MDM Rules/Calculators/A&P                          85 year old male presents as stroke alert, family reporting sudden onset confusion, weakness.  On arrival in ER, patient is unresponsive, neuro present on arrival, CT demonstrating large hemorrhagic stroke.  Patient's wife and children came to ER and they decided to pursue comfort measures only.  Wife said patient had made it clear, he would not want to be put on life support and would want to be made comfortable in this sort of situation.  Family brought to bedside.  All agreeable for comfort measures only.  Admitted to hospitalist for further management.  Final Clinical Impression(s) / ED Diagnoses Final diagnoses:  Hemorrhagic stroke [facility])    Rx / DC Orders ED Discharge Orders    None       Lucrezia Starch, MD 04/29/2020 2340

## 2020-04-08 NOTE — ED Triage Notes (Signed)
Patient arrived with EMS from home [facility] this evening at 2045 , family noticed right arm weakness and altered LOC at 2045 , confused at arrival with diaphoresis and somnolence , transported to CT scan after initial assessment by EDP .

## 2020-04-08 NOTE — Progress Notes (Signed)
This encounter was created in error - please disregard.

## 2020-04-09 ENCOUNTER — Encounter: Payer: Medicare Other | Admitting: Cardiovascular Disease

## 2020-04-09 DIAGNOSIS — Z20822 Contact with and (suspected) exposure to covid-19: Secondary | ICD-10-CM | POA: Diagnosis present

## 2020-04-09 DIAGNOSIS — E785 Hyperlipidemia, unspecified: Secondary | ICD-10-CM | POA: Diagnosis present

## 2020-04-09 DIAGNOSIS — I619 Nontraumatic intracerebral hemorrhage, unspecified: Secondary | ICD-10-CM

## 2020-04-09 DIAGNOSIS — Z515 Encounter for palliative care: Secondary | ICD-10-CM | POA: Insufficient documentation

## 2020-04-09 DIAGNOSIS — R29810 Facial weakness: Secondary | ICD-10-CM | POA: Diagnosis present

## 2020-04-09 DIAGNOSIS — Z955 Presence of coronary angioplasty implant and graft: Secondary | ICD-10-CM | POA: Diagnosis not present

## 2020-04-09 DIAGNOSIS — Z66 Do not resuscitate: Secondary | ICD-10-CM | POA: Diagnosis present

## 2020-04-09 DIAGNOSIS — G936 Cerebral edema: Secondary | ICD-10-CM | POA: Diagnosis present

## 2020-04-09 DIAGNOSIS — E669 Obesity, unspecified: Secondary | ICD-10-CM | POA: Diagnosis present

## 2020-04-09 DIAGNOSIS — R4701 Aphasia: Secondary | ICD-10-CM | POA: Diagnosis present

## 2020-04-09 DIAGNOSIS — R29725 NIHSS score 25: Secondary | ICD-10-CM | POA: Diagnosis present

## 2020-04-09 DIAGNOSIS — Z6830 Body mass index (BMI) 30.0-30.9, adult: Secondary | ICD-10-CM | POA: Diagnosis not present

## 2020-04-09 DIAGNOSIS — I110 Hypertensive heart disease with heart failure: Secondary | ICD-10-CM | POA: Diagnosis present

## 2020-04-09 DIAGNOSIS — I639 Cerebral infarction, unspecified: Secondary | ICD-10-CM | POA: Diagnosis not present

## 2020-04-09 DIAGNOSIS — I5032 Chronic diastolic (congestive) heart failure: Secondary | ICD-10-CM | POA: Diagnosis present

## 2020-04-09 DIAGNOSIS — I2510 Atherosclerotic heart disease of native coronary artery without angina pectoris: Secondary | ICD-10-CM | POA: Diagnosis present

## 2020-04-09 LAB — SARS CORONAVIRUS 2 BY RT PCR (HOSPITAL ORDER, PERFORMED IN ~~LOC~~ HOSPITAL LAB): SARS Coronavirus 2: NEGATIVE

## 2020-04-09 MED ORDER — GLYCOPYRROLATE 0.2 MG/ML IJ SOLN
0.1000 mg | Freq: Once | INTRAMUSCULAR | Status: AC
  Administered 2020-04-09: 0.1 mg via INTRAVENOUS

## 2020-04-09 MED ORDER — POLYVINYL ALCOHOL 1.4 % OP SOLN
1.0000 [drp] | Freq: Four times a day (QID) | OPHTHALMIC | Status: DC | PRN
  Filled 2020-04-09: qty 15

## 2020-04-09 NOTE — Progress Notes (Signed)
[facility] PROGRESS NOTE  Gabriel [name]  OIT:[id number] DOB: 06-09-1935 DOA: 04/13/2020 PCP: Alma Friendly, MD  Brief Narrative: Gabriel [name] is an 85 y.o. male with a history of HTN, HLD, CAD s/p stent, and obesity who presented 9/8 with onset left-sided head pain followed by rapidly declining neurological status.  Initially he had right-sided plegia but had purposeful movements of the left arm and leg, but his exam declined en route to the hospital. Large intracranial hemorrhage was noted on CT. Neurology was consulted. ED provider discussed the patient's current status with his family and they communicated that he values his independence on would want to have a focus on comfort at this point. This was confirmed with the hospitalist team on admission. Comfort care orders have been instituted. Vital signs have continued to worsen.   Subjective: Much family is at bedside. He has 6 children. They report he initially was just snoring last night, now he's requiring frequent oral suctioning, also was twitching his left leg. Initially would grip his left hand when held by his family, but now is entirely unresponsive.   Objective: BP 140/77 (BP Location: Right Arm)   Pulse 69   Temp (!) 97.2 F (36.2 C) (Axillary)   Resp (!) 22   Ht 5\' 10"  (1.778 m)   Wt 97.5 kg   SpO2 (!) 72%   BMI 30.84 kg/m   Gen: Elderly unresponsive gentleman.  Pulm: Slightly tachypneic with upper airway rattling.  CV: Regular on monitor Neuro: Unresponsive, no purposeful movements.  Assessment & Plan: Massive intraparenchymal cerebral hemorrhage: Etiology is possibly hypertensive versus hemorrhagic conversion of an ischemic stroke versus underlying mass lesion.  Family has elected comfort care measures which will continue. His vital signs do not suggest an attempted transfer to residential hospice would be prudent. I have offered my condolences and encouraged the family to let the medical staff know if they need  anything at all. Will add robinul.   [name], MD [facility] [email or web address] 04/09/2020, 9:52 AM

## 2020-04-09 NOTE — Progress Notes (Signed)
Palliative Care Brief Progress Note:  PMT consult received and chart reviewed.   Per notes, patient is now unstable for discharge to hospice facility - hospital death is anticipated and comfort measures were initiated. Notes also reflect that family is present and patient is comfortable. Orders were reviewed, no further recommendations at this time. Spoke with Dr. Bonner Puna - no immediate PMT needs at this time.   If there are any needs in the future, please reach out to PMT - we would be happy to assist. We will follow peripherally.   Thank you for allowing PMT to assist in the care of this patient.   Livie Vanderhoof M. Tamala Julian [facility] Palliative Medicine Team Team Phone: [phone] NO CHARGE

## 2020-04-09 NOTE — ED Notes (Signed)
Report called to 3 west. 

## 2020-04-09 NOTE — ED Notes (Signed)
Pt breathing on own and family at bedside.

## 2020-04-09 NOTE — ED Notes (Signed)
Pt continues to breat unassisted and Family is in room. New orders placed for vitals at family request only, no blood draws.

## 2020-04-09 NOTE — ED Notes (Signed)
Some breathing pattern changes noted but mostly still regular respirations. Patient's color changing and now appearing dusky. Mild mottling noted.

## 2020-04-09 NOTE — Consult Note (Addendum)
Neurology Consultation Reason for Consult: code stroke Referring Physician: Madalyn Rob  CC: AMS   History is obtained from: EMS and chart review   HPI: Gabriel [name] is a 85 y.o. male with past medical history significant for stroke risk factors of hypertension, hyperlipidemia, coronary artery disease status post stent, who presented with sudden onset left-sided head pain followed by all rapidly declining neurological status.  Initially he had right-sided plegia but had purposeful movements of the left arm and leg, but his exam declined in route to the hospital.   ED provider discussed the patient's current status with his family and they communicated that he values his independence on would want to have a focus on comfort at this point.  LKW: 8:45 PM tPA given?: No, due to hemorrhage   ICH Score: 5  Time performed: just before CT scan GCS: 5-12 is 1 point Infratentorial: No.. If yes, 1 point Volume: >30cc is 1 point  Age: 85 y.o.. >80 is 1 point Intraventricular extension is 1 point (unclear IV extension vs. Choroid so did not score for this)  Score: 4  A Score of 4 points has a 30 day mortality of 97%. Stroke. 2001 Apr;32(4):891-7.  ROS: Unable to obtain due to altered mental status.   History reviewed. No pertinent past medical history. other than detailed above  Family Hx:  Father with history of Parkinson disease Mother with history of coronary artery disease status post CABG.  Social History:  reports that he has never smoked. He has never used smokeless tobacco. He reports that he does not drink alcohol and does not use drugs.   Exam: Current vital signs: BP (!) 155/72 (BP Location: Right Arm)    Pulse 78    Temp (!) 97.2 F (36.2 C) (Axillary)    Resp (!) 22    Ht 5\' 10"  (1.778 m)    Wt 97.5 kg    SpO2 92%    BMI 30.84 kg/m  Vital signs in last 24 hours: Temp:  [97.2 F (36.2 C)] 97.2 F (36.2 C) (09/08 2210) Pulse Rate:  [36-93] 78 (09/09 0323) Resp:   [16-26] 22 (09/09 0323) BP: (154-183)/(57-109) 155/72 (09/09 0323) SpO2:  [92 %-95 %] 92 % (09/09 0323) Weight:  [97.5 kg] 97.5 kg (09/08 2211)   Physical Exam  Constitutional: Appears well-developed and well-nourished.  Psych: Affect appropriate to situation Eyes: No scleral injection HENT: No OP obstrucion MSK: no joint deformities.  Cardiovascular: Normal rate and regular rhythm.  Respiratory: Effort normal, non-labored breathing GI: Soft.  No distension. There is no tenderness.  Skin: WDI  Neuro: Mental Status: Eyes open, not following any commands, not tracking examiner Cranial Nerves: II: No clear blink to threat.  Pupils are equal, round, and reactive to light.  3 to 2 mm III,IV, VI: VOR intact  V: Corneals intact eyelash brush bilaterally VII: Minimal facial movement, right facial droop VIII: No response to voice X:XI: Weak gag and cough  Motor/Sensory: No movement to maximal noxious stimulation in all 4 extremities   NIHSS 25 2 for LOC questions  2 for LOC commands 2 for facial droop 4 x 2 for no movement of any of his right limbs 3 x 2 for slight movement of his left UE and LE  2 points for severe sensory loss 3 points for mute (aphasic)    I have reviewed labs in epic and the results pertinent to this consultation are: Cr 1.3  WBC 7 INR 1.1  PT and APPT  WNL   I have reviewed the images obtained:  HCT    Impression: [name] is an 85 year old gentleman who has suffered an intraparenchymal hemorrhage.  Etiology is possibly hypertensive versus hemorrhagic conversion of an ischemic stroke versus underlying mass lesion.  Family has elected comfort care measures.  Recommendations: -Appreciate comfort care per primary team, neurology will sign off at this time   [facility] [phone]  Addended for charge capture

## 2020-04-09 NOTE — ED Notes (Signed)
Family at bed side . Pt breathing on own . Pt nonresponsive to verbal stimulation.

## 2020-04-09 NOTE — Progress Notes (Signed)
Patient arrived to 3W11. Pt not arousable and does not follow any commands. Pupils not reactive bilaterally. Nurse will continue to keep patient comfortable.

## 2020-04-09 NOTE — ED Notes (Addendum)
Family at bedside- pt is unresponsive. Some reflexive posturing noted in lower legs. No gag reflex when mouth care performed. Eyes fixed. Education provided for family. Breathing is regular at this time.

## 2020-04-09 NOTE — ED Notes (Signed)
Family remains at bedside- no needs. Pt appears comfortable.

## 2020-04-10 MED ORDER — KETOROLAC TROMETHAMINE 15 MG/ML IJ SOLN: 15.0000 mg | Freq: Four times a day (QID) | INTRAMUSCULAR | Status: DC

## 2020-04-10 MED ORDER — KETOROLAC TROMETHAMINE 15 MG/ML IJ SOLN
15.0000 mg | Freq: Four times a day (QID) | INTRAMUSCULAR | Status: DC
  Administered 2020-04-10 – 2020-04-11 (×4): 15 mg via INTRAVENOUS
  Filled 2020-04-10 (×4): qty 1

## 2020-04-10 NOTE — Progress Notes (Signed)
CSW acknowledging consult to assist with arranging hospice care. However, per chart review, hospital death expected; patient not stable for transition to hospice services.   Should patient situation change, please consult CSW again.  Laveda Abbe, [place] Clinical Social Worker [phone]

## 2020-04-10 NOTE — Progress Notes (Signed)
[facility] PROGRESS NOTE  Dmarcus Decicco  LKT:[id number] DOB: 02-15-1935 DOA: 04/16/2020 PCP: Alma Friendly, MD  Brief Narrative: Gabriel [name] is an 85 y.o. male with a history of HTN, HLD, CAD s/p stent, and obesity who presented 9/8 with onset left-sided head pain followed by rapidly declining neurological status.  Initially he had right-sided plegia but had purposeful movements of the left arm and leg, but his exam declined en route to the hospital. Large intracranial hemorrhage was noted on CT. Neurology was consulted. ED provider discussed the patient's current status with his family and they communicated that he values his independence on would want to have a focus on comfort at this point. This was confirmed with the hospitalist team on admission. Comfort care orders have been instituted. Vital signs have continued to worsen. He does not appear stable for transfer to hospice.  Subjective: Family at bedside supporting each other. He developed a fever overnight partially responsive to tylenol but continues to recur despite tylenol.   Objective: BP 139/69 (BP Location: Right Arm)   Pulse 100   Temp (!) 101.5 F (38.6 C) (Axillary)   Resp (!) 22   Ht 5\' 10"  (1.778 m)   Wt 97.5 kg   SpO2 (!) 88%   BMI 30.84 kg/m   Gen: Elderly, unresponsive, diaphoretic Pulm: Tachypneic without accessory muscle use CV: Regular tachycardia Neuro: No movements or response.  Assessment & Plan: Massive intraparenchymal cerebral hemorrhage: Etiology is possibly hypertensive versus hemorrhagic conversion of an ischemic stroke versus underlying mass lesion. Family has elected comfort care measures which will continue.  - Continue prn's as ordered. Robinul has helped symptoms.  - Add scheduled toradol IV for fever, which may be due to aspiration and/or neurogenic fever. - Exam and vital signs continue to suggest that an attempted transfer to residential hospice would be prudent.  Gabriel Pour,  MD [facility] [email or web address] 04/10/2020, 9:35 AM

## 2020-04-13 ENCOUNTER — Encounter: Payer: Self-pay | Admitting: Cardiovascular Disease

## 2020-05-01 NOTE — Progress Notes (Signed)
Patient family came to nurses station to alert nurse that they thought the patient had passed away. Nurse went to the room to assess the patient to find the patient with no respirations and no palpable pulses. Death pronounced by two RN's at bedside. Family present at time. MD notified.  Gwendolyn Grant, RN

## 2020-05-01 NOTE — Death Summary Note (Addendum)
DEATH SUMMARY   Patient Details  Name: Gabriel [name] MRN: [id number] DOB: Apr 30, 1935  Admission/Discharge Information   Admit Date:  04/07/2020  Date of Death: Date of Death: 04-14-2020  Time of Death: Time of Death: 1108-10-24  Length of Stay: 2  Referring Physician: Alma Friendly, MD   Diagnoses  Preliminary cause of death:  Secondary Diagnoses (including complications and co-morbidities):  Active Problems:   Acute CVA (cerebrovascular accident) [facility])   Brief Hospital Course (including significant findings, care, treatment, and services provided and events leading to death)  Gabriel [name] is a 85 y.o. year old male with a history of HTN, HLD, CAD s/p stent, and obesity who presented 2023/04/12 with onset left-sided head pain followed by rapidly declining neurological status. Initially he had right-sided plegia but had purposeful movements of the left arm and leg, but his exam declined en route to the hospital.Large intracranial hemorrhage was noted on CT. Neurology was consulted. ED provider discussed the patient's current status with his family and they communicated that he values his independence on would want to have a focus on comfort at this point. This was confirmed with the hospitalist team on admission. Comfort care orders have been instituted. Vital signs have continued to worsen. He does not appear stable for transfer to hospice. Patient ultimately passed on 04-14-2020 at 11:10 A.M. Family was at bedside during his passing.   Pertinent Labs and Studies  Significant Diagnostic Studies CT HEAD CODE STROKE WO CONTRAST  Result Date: 04/30/2020 CLINICAL DATA:  Code stroke. Initial evaluation for acute altered mental status, unresponsive. EXAM: CT HEAD WITHOUT CONTRAST TECHNIQUE: Contiguous axial images were obtained from the base of the skull through the vertex without intravenous contrast. COMPARISON:  None. FINDINGS: Brain: Large intraparenchymal hemorrhage centered at the left  frontotemporal region measures 8.7 x 5.9 x 3.7 cm (AP by craniocaudad by transverse) (estimated volume 95 cc). Surrounding vasogenic edema with regional mass effect throughout the adjacent left cerebral hemisphere. Left lateral ventricle partially effaced associated left-to-right shift measures up to 9 mm. No overt hydrocephalus or ventricular trapping at this time. Basilar cisterns are crowded but remain patent. Question trace intraventricular extension with subtle hyperdensity within the left lateral ventricle. No other acute intracranial hemorrhage. No other acute large vessel territory infarct. No appreciable mass lesion. No extra-axial fluid collection. Underlying atrophy with chronic small vessel ischemic disease noted. Vascular: No hyperdense vessel. Scattered vascular calcifications noted within the carotid siphons. Skull: Scalp soft tissues within normal limits.  Calvarium intact. Sinuses/Orbits: Globes and orbital soft tissues within normal limits. Scattered mucosal thickening noted within the ethmoidal air cells and maxillary sinuses. Paranasal sinuses are otherwise clear. No mastoid effusion. Other: None. IMPRESSION: 1. 8.7 x 5.9 x 3.7 cm intraparenchymal hemorrhage centered at the left frontotemporal region, (estimated volume 95 cc). Surrounding vasogenic edema with regional mass effect throughout the adjacent left cerebral hemisphere, with associated 9 mm of left-to-right shift. No hydrocephalus or ventricular trapping at this time. 2. Question trace intraventricular extension with subtle hyperdensity within the left lateral ventricle. 3. Underlying atrophy with chronic microvascular ischemic disease. These results were communicated to Dr. Curly Shores at 10:10 pmon 11-Sep-2021by text page via the [facility] messaging system. Electronically Signed   By: Jeannine Boga M.D.   On: 04/18/2020 22:11    Microbiology Recent Results (from the past 240 hour(s))  SARS Coronavirus 2 by RT PCR (hospital order,  performed in [facility] hospital lab) Nasopharyngeal Nasopharyngeal Swab     Status: None   Collection Time:  04/15/2020 11:00 PM   Specimen: Nasopharyngeal Swab  Result Value Ref Range Status   SARS Coronavirus 2 NEGATIVE NEGATIVE Final    Comment: (NOTE) SARS-CoV-2 target nucleic acids are NOT DETECTED.  The SARS-CoV-2 RNA is generally detectable in upper and lower respiratory specimens during the acute phase of infection. The lowest concentration of SARS-CoV-2 viral copies this assay can detect is 250 copies / mL. A negative result does not preclude SARS-CoV-2 infection and should not be used as the sole basis for treatment or other patient management decisions.  A negative result may occur with improper specimen collection / handling, submission of specimen other than nasopharyngeal swab, presence of viral mutation(s) within the areas targeted by this assay, and inadequate number of viral copies (<250 copies / mL). A negative result must be combined with clinical observations, patient history, and epidemiological information.  Fact Sheet for Patients:   StrictlyIdeas.no  Fact Sheet for Healthcare Providers: BankingDealers.co.za  This test is not yet approved or  cleared by the [place] FDA and has been authorized for detection and/or diagnosis of SARS-CoV-2 by FDA under an Emergency Use Authorization (EUA).  This EUA will remain in effect (meaning this test can be used) for the duration of the COVID-19 declaration under Section 564(b)(1) of the Act, 21 U.S.C. section 360bbb-3(b)(1), unless the authorization is terminated or revoked sooner.  Performed at [facility], [place] [address], [place], [place] [address]     Lab Basic Metabolic Panel: Recent Labs  Lab 04/10/2020 2149 04/12/2020 2151  NA 138 139  K 4.0 4.0  CL 104 105  CO2 22  --   GLUCOSE 176* 172*  BUN 21 23  CREATININE 1.26* 1.30*  CALCIUM 9.1  --     Liver Function Tests: Recent Labs  Lab 04/10/2020 2149  AST 34  ALT 29  ALKPHOS 48  BILITOT 0.6  PROT 7.2  ALBUMIN 4.1   No results for input(s): LIPASE, AMYLASE in the last 168 hours. No results for input(s): AMMONIA in the last 168 hours. CBC: Recent Labs  Lab 04/06/2020 2149 04/02/2020 2151  WBC 7.5  --   NEUTROABS 4.1  --   HGB 14.2 13.6  HCT 41.0 40.0  MCV 101.5*  --   PLT 178  --    Cardiac Enzymes: No results for input(s): CKTOTAL, CKMB, CKMBINDEX, TROPONINI in the last 168 hours. Sepsis Labs: Recent Labs  Lab 04/25/2020 2149  WBC 7.5    Little Ishikawa DO 19-Apr-2020, 11:52 AM

## 2020-05-01 DEATH — deceased

## 2020-06-08 ENCOUNTER — Other Ambulatory Visit: Payer: Medicare Other

## 2020-08-17 ENCOUNTER — Ambulatory Visit: Payer: Medicare Other | Admitting: Adult Health
